# Patient Record
Sex: Female | Born: 1975
Health system: Southern US, Community
[De-identification: ages and names within clinical notes are randomized; demographics above are authoritative.]

## PROBLEM LIST (undated history)

## (undated) DIAGNOSIS — C4491 Basal cell carcinoma of skin, unspecified: Secondary | ICD-10-CM

## (undated) DIAGNOSIS — M7750 Other enthesopathy of unspecified foot: Secondary | ICD-10-CM

## (undated) DIAGNOSIS — E785 Hyperlipidemia, unspecified: Secondary | ICD-10-CM

## (undated) DIAGNOSIS — M51369 Other intervertebral disc degeneration, lumbar region without mention of lumbar back pain or lower extremity pain: Secondary | ICD-10-CM

## (undated) DIAGNOSIS — E119 Type 2 diabetes mellitus without complications: Secondary | ICD-10-CM

## (undated) DIAGNOSIS — J45909 Unspecified asthma, uncomplicated: Secondary | ICD-10-CM

## (undated) DIAGNOSIS — F419 Anxiety disorder, unspecified: Secondary | ICD-10-CM

## (undated) DIAGNOSIS — T7840XA Allergy, unspecified, initial encounter: Secondary | ICD-10-CM

## (undated) DIAGNOSIS — G43909 Migraine, unspecified, not intractable, without status migrainosus: Secondary | ICD-10-CM

## (undated) DIAGNOSIS — I341 Nonrheumatic mitral (valve) prolapse: Secondary | ICD-10-CM

## (undated) DIAGNOSIS — M797 Fibromyalgia: Secondary | ICD-10-CM

## (undated) DIAGNOSIS — G4730 Sleep apnea, unspecified: Secondary | ICD-10-CM

## (undated) DIAGNOSIS — H16139 Photokeratitis, unspecified eye: Secondary | ICD-10-CM

## (undated) DIAGNOSIS — I890 Lymphedema, not elsewhere classified: Secondary | ICD-10-CM

## (undated) DIAGNOSIS — I872 Venous insufficiency (chronic) (peripheral): Secondary | ICD-10-CM

## (undated) DIAGNOSIS — C439 Malignant melanoma of skin, unspecified: Secondary | ICD-10-CM

## (undated) DIAGNOSIS — M5136 Other intervertebral disc degeneration, lumbar region: Secondary | ICD-10-CM

## (undated) HISTORY — PX: FOOT TENDON SURGERY: SHX958

## (undated) HISTORY — DX: Unspecified asthma, uncomplicated: J45.909

## (undated) HISTORY — DX: Malignant melanoma of skin, unspecified: C43.9

## (undated) HISTORY — DX: Anxiety disorder, unspecified: F41.9

## (undated) HISTORY — DX: Hyperlipidemia, unspecified: E78.5

## (undated) HISTORY — DX: Other enthesopathy of unspecified foot and ankle: M77.50

## (undated) HISTORY — DX: Allergy, unspecified, initial encounter: T78.40XA

## (undated) HISTORY — DX: Basal cell carcinoma of skin, unspecified: C44.91

## (undated) HISTORY — PX: ABDOMINAL HYSTERECTOMY: SHX81

## (undated) HISTORY — PX: CARPAL TUNNEL RELEASE: SHX101

## (undated) HISTORY — PX: KNEE SURGERY: SHX244

## (undated) HISTORY — DX: Lymphedema, not elsewhere classified: I89.0

## (undated) HISTORY — PX: SPINAL FUSION: SHX223

## (undated) HISTORY — DX: Venous insufficiency (chronic) (peripheral): I87.2

## (undated) HISTORY — DX: Nonrheumatic mitral (valve) prolapse: I34.1

## (undated) HISTORY — DX: Migraine, unspecified, not intractable, without status migrainosus: G43.909

## (undated) HISTORY — DX: Photokeratitis, unspecified eye: H16.139

---

## 1998-05-18 ENCOUNTER — Other Ambulatory Visit: Admission: RE | Admit: 1998-05-18 | Discharge: 1998-05-18 | Payer: Self-pay | Admitting: Obstetrics and Gynecology

## 1998-11-25 ENCOUNTER — Encounter: Payer: Self-pay | Admitting: Emergency Medicine

## 1998-11-25 ENCOUNTER — Emergency Department (HOSPITAL_COMMUNITY): Admission: EM | Admit: 1998-11-25 | Discharge: 1998-11-25 | Payer: Self-pay | Admitting: Emergency Medicine

## 1999-07-11 ENCOUNTER — Emergency Department (HOSPITAL_COMMUNITY): Admission: EM | Admit: 1999-07-11 | Discharge: 1999-07-11 | Payer: Self-pay | Admitting: Emergency Medicine

## 2000-03-12 ENCOUNTER — Emergency Department (HOSPITAL_COMMUNITY): Admission: EM | Admit: 2000-03-12 | Discharge: 2000-03-12 | Payer: Self-pay | Admitting: Internal Medicine

## 2000-03-12 ENCOUNTER — Encounter: Payer: Self-pay | Admitting: Emergency Medicine

## 2000-07-01 ENCOUNTER — Emergency Department (HOSPITAL_COMMUNITY): Admission: EM | Admit: 2000-07-01 | Discharge: 2000-07-01 | Payer: Self-pay | Admitting: Emergency Medicine

## 2000-07-05 ENCOUNTER — Encounter: Payer: Self-pay | Admitting: Internal Medicine

## 2000-07-05 ENCOUNTER — Ambulatory Visit: Admission: RE | Admit: 2000-07-05 | Discharge: 2000-07-05 | Payer: Self-pay | Admitting: *Deleted

## 2000-09-07 ENCOUNTER — Emergency Department (HOSPITAL_COMMUNITY): Admission: EM | Admit: 2000-09-07 | Discharge: 2000-09-07 | Payer: Self-pay | Admitting: Internal Medicine

## 2000-10-18 ENCOUNTER — Encounter: Payer: Self-pay | Admitting: Emergency Medicine

## 2000-10-18 ENCOUNTER — Emergency Department (HOSPITAL_COMMUNITY): Admission: EM | Admit: 2000-10-18 | Discharge: 2000-10-18 | Payer: Self-pay | Admitting: Emergency Medicine

## 2000-12-30 ENCOUNTER — Emergency Department (HOSPITAL_COMMUNITY): Admission: EM | Admit: 2000-12-30 | Discharge: 2000-12-30 | Payer: Self-pay | Admitting: *Deleted

## 2001-01-07 ENCOUNTER — Encounter: Admission: RE | Admit: 2001-01-07 | Discharge: 2001-04-07 | Payer: Self-pay | Admitting: Orthopedic Surgery

## 2001-07-13 ENCOUNTER — Emergency Department (HOSPITAL_COMMUNITY): Admission: EM | Admit: 2001-07-13 | Discharge: 2001-07-13 | Payer: Self-pay | Admitting: Emergency Medicine

## 2001-07-13 ENCOUNTER — Encounter: Payer: Self-pay | Admitting: Emergency Medicine

## 2001-08-27 ENCOUNTER — Encounter (INDEPENDENT_AMBULATORY_CARE_PROVIDER_SITE_OTHER): Payer: Self-pay

## 2001-08-27 ENCOUNTER — Ambulatory Visit (HOSPITAL_COMMUNITY): Admission: RE | Admit: 2001-08-27 | Discharge: 2001-08-27 | Payer: Self-pay | Admitting: Gynecology

## 2001-12-09 ENCOUNTER — Emergency Department (HOSPITAL_COMMUNITY): Admission: EM | Admit: 2001-12-09 | Discharge: 2001-12-09 | Payer: Self-pay | Admitting: Emergency Medicine

## 2002-01-13 ENCOUNTER — Encounter: Admission: RE | Admit: 2002-01-13 | Discharge: 2002-01-13 | Payer: Self-pay | Admitting: Occupational Medicine

## 2002-01-13 ENCOUNTER — Encounter: Payer: Self-pay | Admitting: Occupational Medicine

## 2002-04-01 ENCOUNTER — Emergency Department (HOSPITAL_COMMUNITY): Admission: EM | Admit: 2002-04-01 | Discharge: 2002-04-01 | Payer: Self-pay | Admitting: Emergency Medicine

## 2003-12-12 ENCOUNTER — Emergency Department (HOSPITAL_COMMUNITY): Admission: EM | Admit: 2003-12-12 | Discharge: 2003-12-12 | Payer: Self-pay | Admitting: Emergency Medicine

## 2003-12-28 ENCOUNTER — Emergency Department (HOSPITAL_COMMUNITY): Admission: EM | Admit: 2003-12-28 | Discharge: 2003-12-28 | Payer: Self-pay | Admitting: Emergency Medicine

## 2003-12-31 ENCOUNTER — Emergency Department (HOSPITAL_COMMUNITY): Admission: EM | Admit: 2003-12-31 | Discharge: 2003-12-31 | Payer: Self-pay | Admitting: Emergency Medicine

## 2004-11-07 ENCOUNTER — Emergency Department: Payer: Self-pay | Admitting: Emergency Medicine

## 2004-12-27 ENCOUNTER — Ambulatory Visit: Payer: Self-pay | Admitting: Family Medicine

## 2004-12-30 ENCOUNTER — Ambulatory Visit: Payer: Self-pay | Admitting: Family Medicine

## 2005-01-12 ENCOUNTER — Ambulatory Visit: Payer: Self-pay | Admitting: Family Medicine

## 2005-02-07 ENCOUNTER — Ambulatory Visit: Payer: Self-pay | Admitting: Family Medicine

## 2005-02-14 ENCOUNTER — Ambulatory Visit: Payer: Self-pay | Admitting: Family Medicine

## 2005-03-14 ENCOUNTER — Ambulatory Visit: Payer: Self-pay | Admitting: Family Medicine

## 2005-05-09 ENCOUNTER — Ambulatory Visit: Payer: Self-pay | Admitting: Internal Medicine

## 2005-05-10 ENCOUNTER — Ambulatory Visit: Payer: Self-pay | Admitting: Internal Medicine

## 2005-05-13 ENCOUNTER — Emergency Department: Payer: Self-pay | Admitting: Emergency Medicine

## 2005-05-15 ENCOUNTER — Ambulatory Visit: Payer: Self-pay | Admitting: Family Medicine

## 2005-05-22 ENCOUNTER — Ambulatory Visit: Payer: Self-pay | Admitting: Cardiology

## 2005-06-02 ENCOUNTER — Ambulatory Visit: Payer: Self-pay | Admitting: Family Medicine

## 2005-06-02 LAB — CONVERTED CEMR LAB: Blood Glucose, Fasting: 92 mg/dL

## 2005-06-05 ENCOUNTER — Ambulatory Visit (HOSPITAL_COMMUNITY): Admission: RE | Admit: 2005-06-05 | Discharge: 2005-06-05 | Payer: Self-pay | Admitting: Family Medicine

## 2005-06-15 ENCOUNTER — Ambulatory Visit: Payer: Self-pay | Admitting: Internal Medicine

## 2005-06-21 ENCOUNTER — Ambulatory Visit: Payer: Self-pay | Admitting: Internal Medicine

## 2005-06-29 ENCOUNTER — Ambulatory Visit: Payer: Self-pay | Admitting: Internal Medicine

## 2005-08-14 ENCOUNTER — Emergency Department (HOSPITAL_COMMUNITY): Admission: EM | Admit: 2005-08-14 | Discharge: 2005-08-14 | Payer: Self-pay | Admitting: Emergency Medicine

## 2006-04-18 ENCOUNTER — Emergency Department: Payer: Self-pay | Admitting: Emergency Medicine

## 2006-09-21 ENCOUNTER — Ambulatory Visit: Payer: Self-pay | Admitting: Family Medicine

## 2006-11-14 ENCOUNTER — Ambulatory Visit: Payer: Self-pay | Admitting: Internal Medicine

## 2006-11-14 LAB — CONVERTED CEMR LAB
Alkaline Phosphatase: 92 units/L (ref 39–117)
Total Bilirubin: 0.8 mg/dL (ref 0.3–1.2)
Total Protein: 7.4 g/dL (ref 6.0–8.3)

## 2006-11-21 ENCOUNTER — Encounter: Admission: RE | Admit: 2006-11-21 | Discharge: 2006-11-21 | Payer: Self-pay | Admitting: Internal Medicine

## 2007-03-05 ENCOUNTER — Ambulatory Visit: Payer: Self-pay | Admitting: Internal Medicine

## 2007-03-05 LAB — CONVERTED CEMR LAB
Albumin: 3.7 g/dL (ref 3.5–5.2)
Total Bilirubin: 0.8 mg/dL (ref 0.3–1.2)

## 2007-03-18 ENCOUNTER — Ambulatory Visit: Payer: Self-pay | Admitting: Unknown Physician Specialty

## 2007-03-21 ENCOUNTER — Ambulatory Visit: Payer: Self-pay | Admitting: Unknown Physician Specialty

## 2007-05-18 ENCOUNTER — Other Ambulatory Visit: Payer: Self-pay

## 2007-05-18 ENCOUNTER — Emergency Department: Payer: Self-pay | Admitting: Emergency Medicine

## 2007-08-02 ENCOUNTER — Ambulatory Visit: Payer: Self-pay | Admitting: Family Medicine

## 2007-08-02 DIAGNOSIS — R945 Abnormal results of liver function studies: Secondary | ICD-10-CM

## 2007-08-02 DIAGNOSIS — N951 Menopausal and female climacteric states: Secondary | ICD-10-CM | POA: Insufficient documentation

## 2007-08-02 DIAGNOSIS — F410 Panic disorder [episodic paroxysmal anxiety] without agoraphobia: Secondary | ICD-10-CM

## 2007-08-02 DIAGNOSIS — IMO0001 Reserved for inherently not codable concepts without codable children: Secondary | ICD-10-CM

## 2007-08-02 DIAGNOSIS — E162 Hypoglycemia, unspecified: Secondary | ICD-10-CM

## 2007-08-02 DIAGNOSIS — J45909 Unspecified asthma, uncomplicated: Secondary | ICD-10-CM | POA: Insufficient documentation

## 2007-08-02 DIAGNOSIS — Z87891 Personal history of nicotine dependence: Secondary | ICD-10-CM

## 2007-08-02 DIAGNOSIS — F411 Generalized anxiety disorder: Secondary | ICD-10-CM

## 2007-08-05 LAB — CONVERTED CEMR LAB: TSH: 1.993 microintl units/mL (ref 0.350–5.50)

## 2007-08-23 ENCOUNTER — Ambulatory Visit: Payer: Self-pay | Admitting: Internal Medicine

## 2007-08-23 LAB — CONVERTED CEMR LAB
Albumin: 3.6 g/dL (ref 3.5–5.2)
Bilirubin, Direct: 0.1 mg/dL (ref 0.0–0.3)
Total Bilirubin: 0.7 mg/dL (ref 0.3–1.2)
Total Protein: 6.9 g/dL (ref 6.0–8.3)

## 2007-10-14 ENCOUNTER — Other Ambulatory Visit: Payer: Self-pay

## 2007-10-14 ENCOUNTER — Emergency Department: Payer: Self-pay | Admitting: Internal Medicine

## 2008-04-03 ENCOUNTER — Ambulatory Visit (HOSPITAL_COMMUNITY): Admission: RE | Admit: 2008-04-03 | Discharge: 2008-04-03 | Payer: Self-pay | Admitting: Neurosurgery

## 2008-06-28 IMAGING — US US EXTREM LOW VENOUS BILAT
1 series · 17 of 24 positions shown · non-contrast
Comparison: none

REASON FOR EXAM: leg pain
COMMENTS:

PROCEDURE:     US  - US DOPPLER LOW EXTR BILATERAL  - May 18, 2007  [DATE]
RESULT:     The right and left femoral and popliteal veins are normally
compressible. The waveform patterns are normal and the color-flow images are
normal.

[Series 1: us extrem low venous bilat · 17 of 58 slices shown]
[im 1/58]
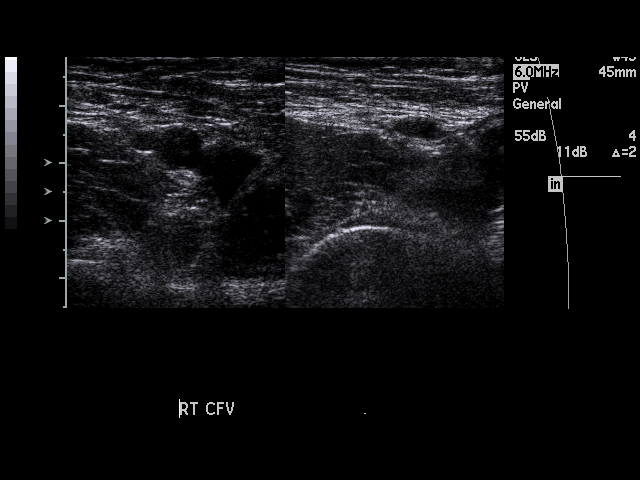
[im 5/58]
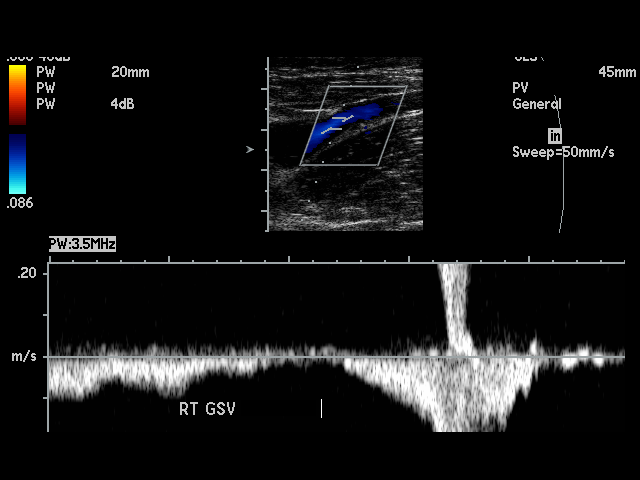
[im 8/58]
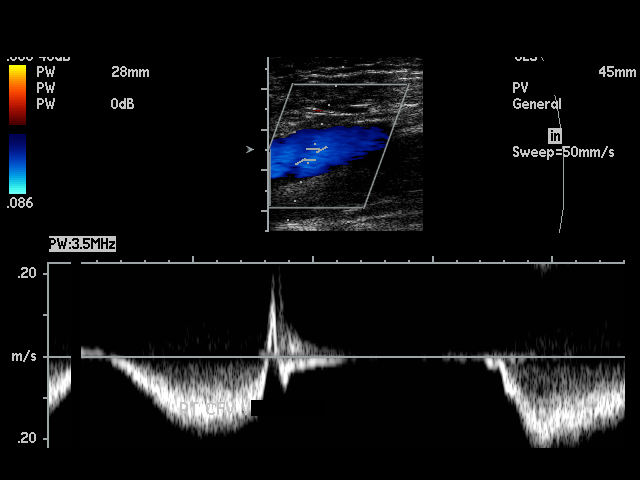
[im 10/58]
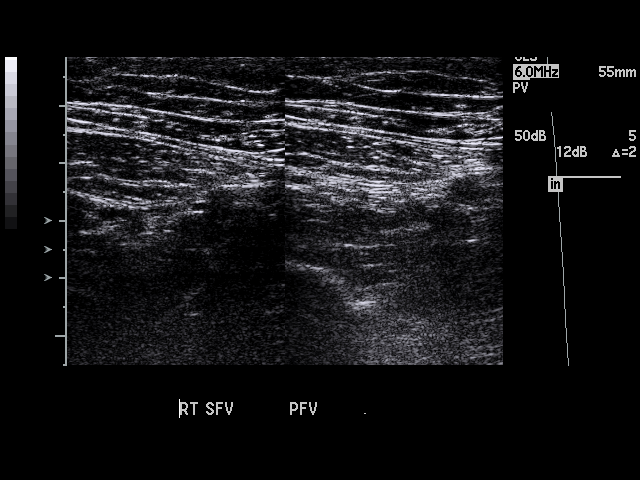
[im 15/58]
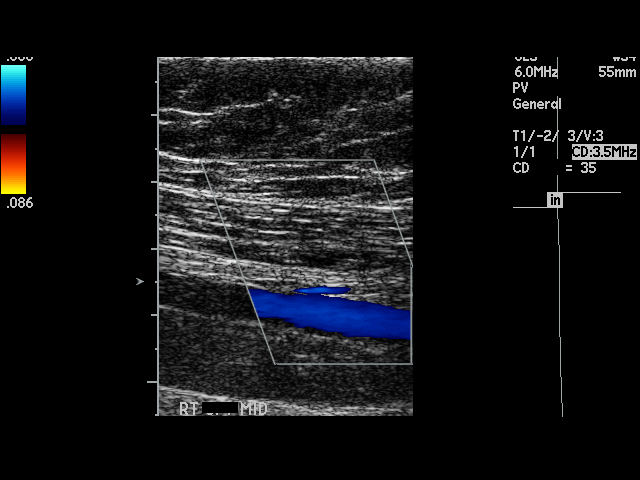
[im 18/58]
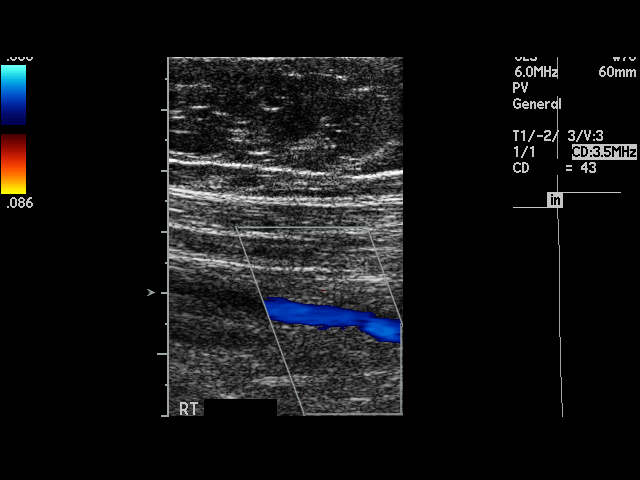
[im 23/58]
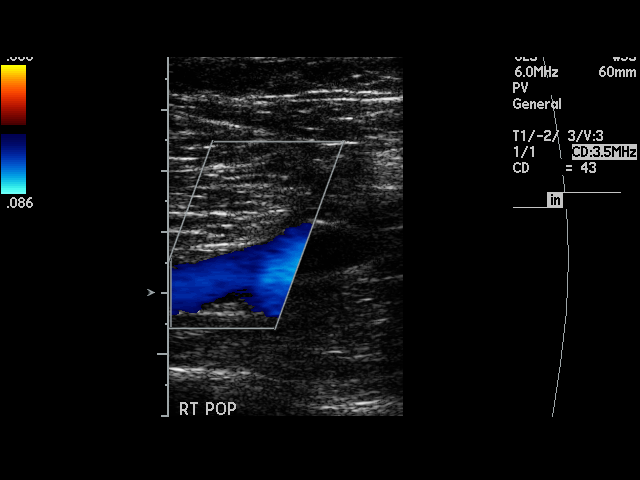
[im 25/58]
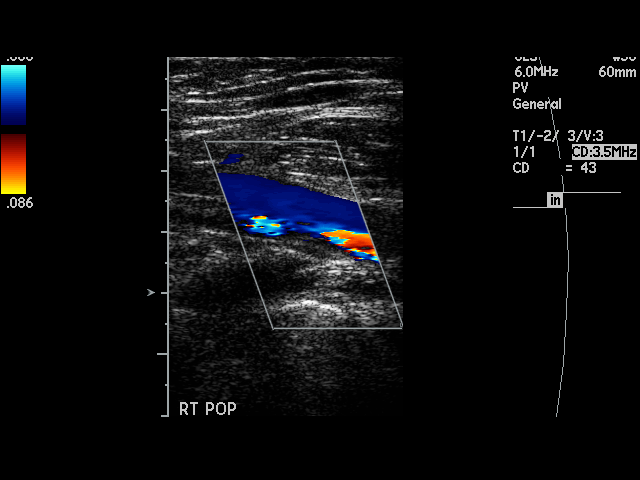
[im 30/58]
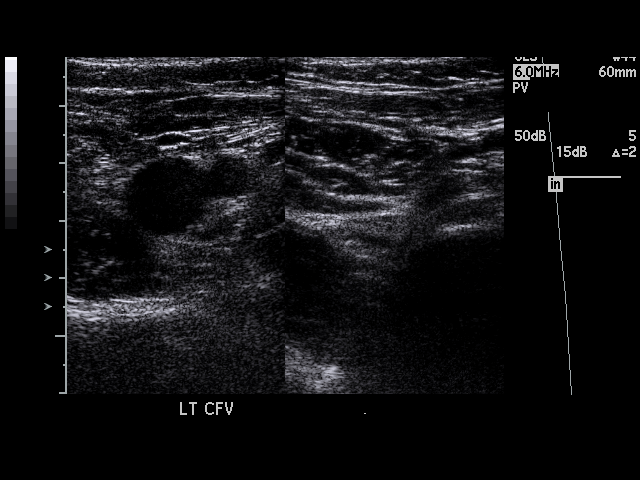
[im 33/58]
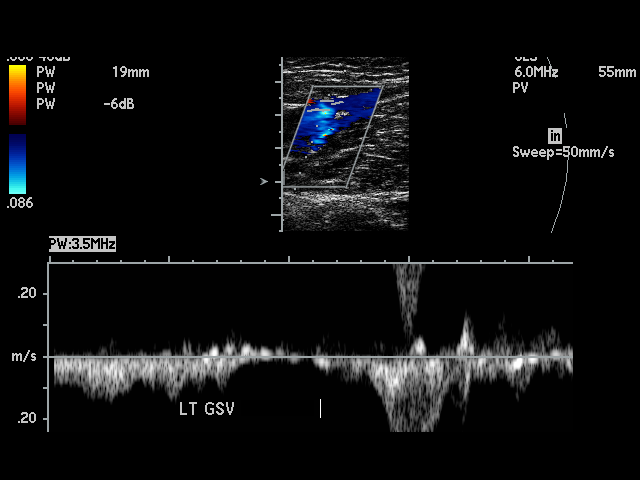
[im 35/58]
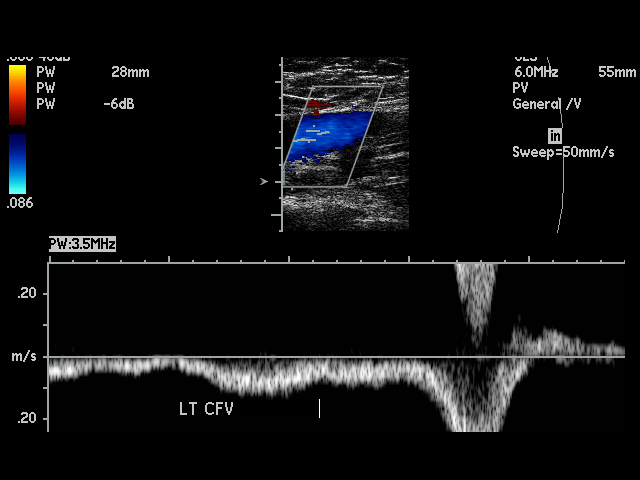
[im 40/58]
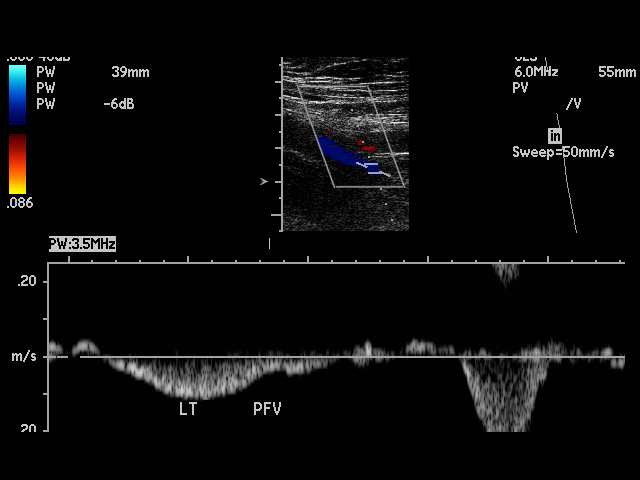
[im 43/58]
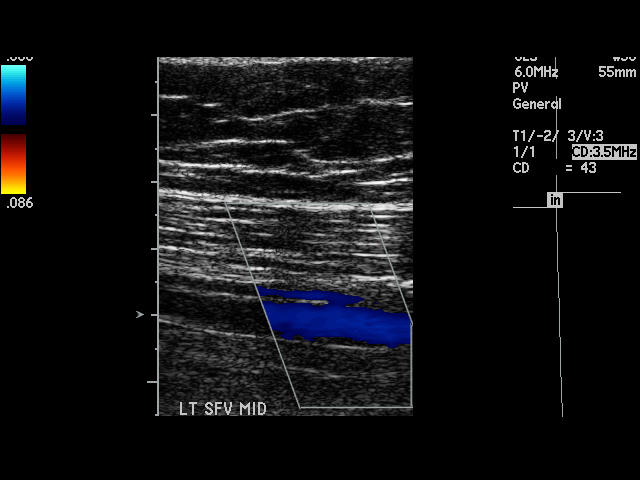
[im 48/58]
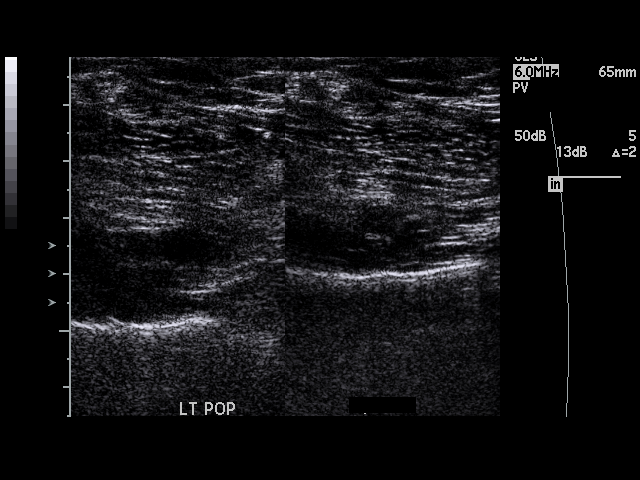
[im 50/58]
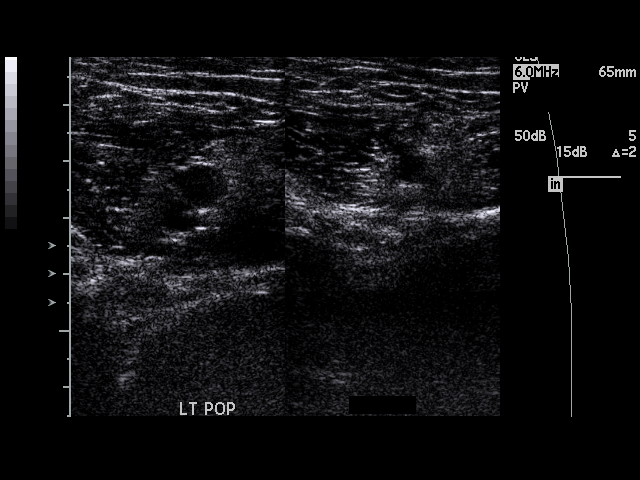
[im 53/58]
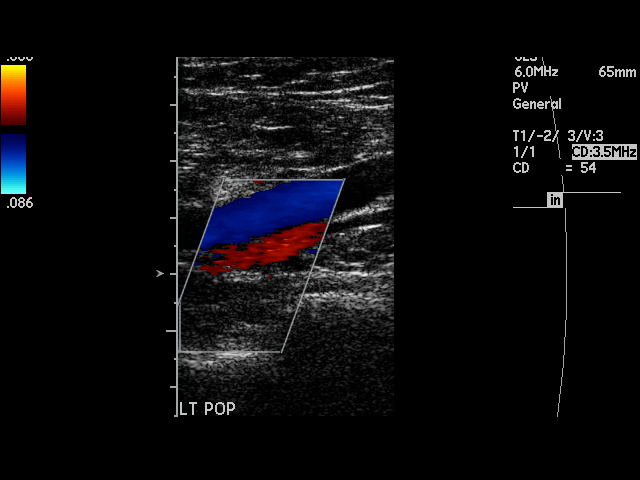
[im 58/58]
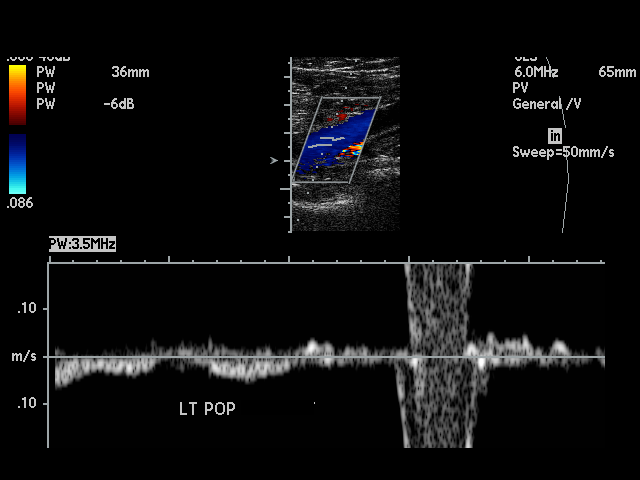

[17 of 24 positions shown; findings below may reference images not displayed]

IMPRESSION: I do not see thrombus within the right or left femoral or
popliteal veins.

The findings were called to the [HOSPITAL] the conclusion of
the study.

## 2008-07-24 ENCOUNTER — Emergency Department (HOSPITAL_COMMUNITY): Admission: EM | Admit: 2008-07-24 | Discharge: 2008-07-24 | Payer: Self-pay | Admitting: Emergency Medicine

## 2008-10-05 ENCOUNTER — Encounter: Admission: RE | Admit: 2008-10-05 | Discharge: 2008-10-05 | Payer: Self-pay | Admitting: Specialist

## 2008-11-26 ENCOUNTER — Inpatient Hospital Stay (HOSPITAL_COMMUNITY): Admission: RE | Admit: 2008-11-26 | Discharge: 2008-11-29 | Payer: Self-pay | Admitting: Specialist

## 2009-07-09 ENCOUNTER — Encounter: Admission: RE | Admit: 2009-07-09 | Discharge: 2009-07-09 | Payer: Self-pay | Admitting: Specialist

## 2009-11-18 ENCOUNTER — Encounter: Admission: RE | Admit: 2009-11-18 | Discharge: 2009-12-09 | Payer: Self-pay | Admitting: Anesthesiology

## 2009-12-09 ENCOUNTER — Encounter: Admission: RE | Admit: 2009-12-09 | Discharge: 2010-01-11 | Payer: Self-pay | Admitting: Anesthesiology

## 2010-01-07 IMAGING — RF DG LUMBAR SPINE 2-3V
1 series · 2 of 2 positions shown · non-contrast
Comparison: CT lumbar spine 10/05/2008.

CLINICAL DATA: L3-S1 PLIF.

LUMBAR SPINE - 2-3 VIEW

[Series 1: run · 2 of 2 slices shown]
[im 1/2]
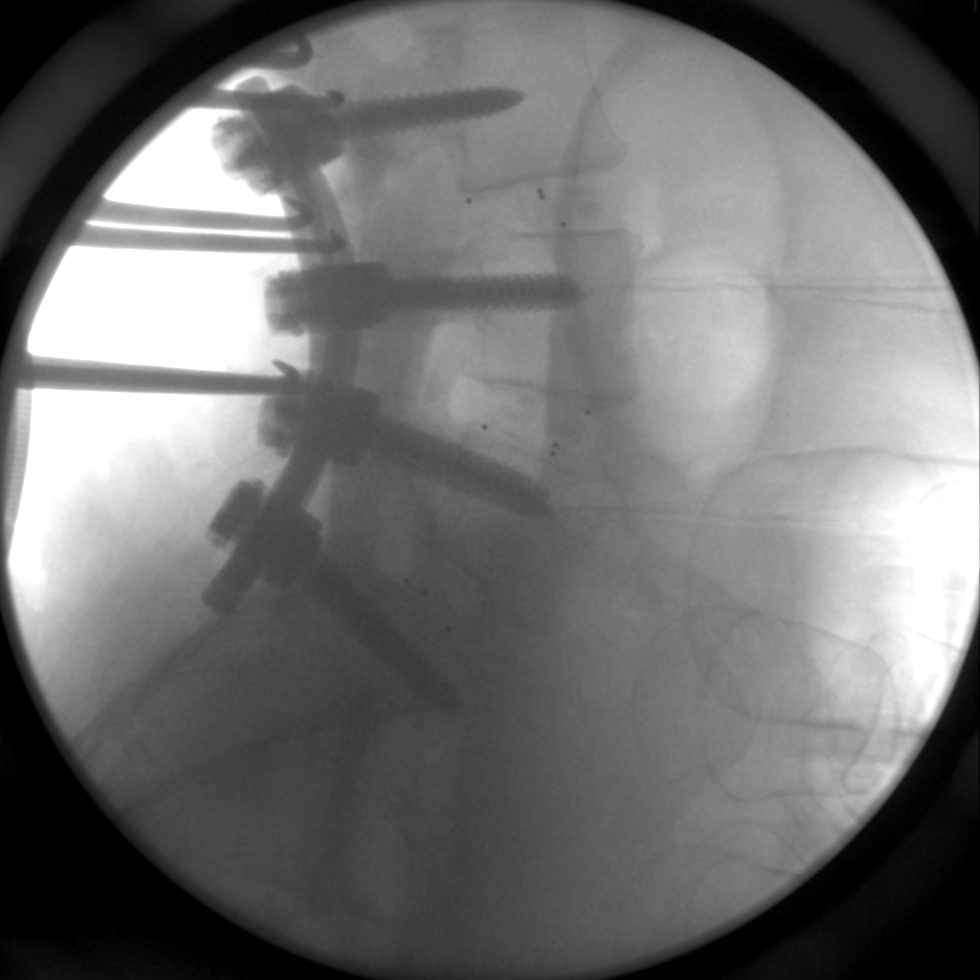
[im 2/2]
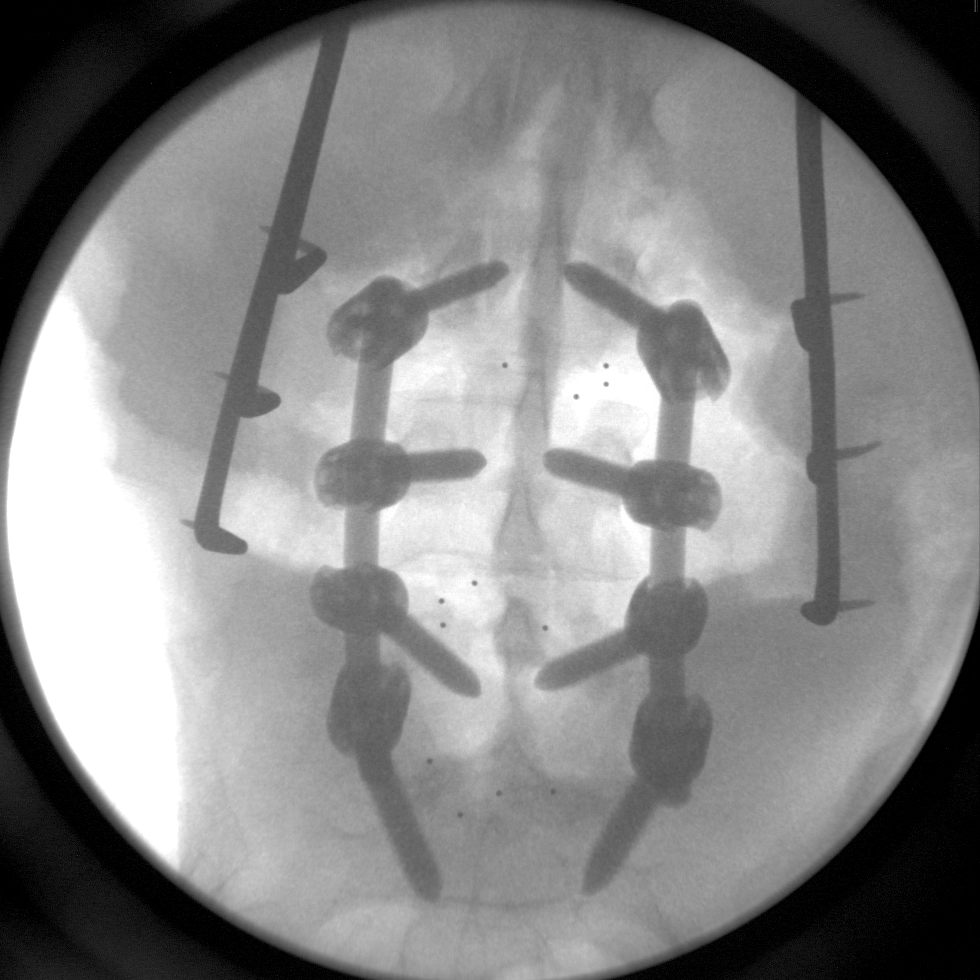

[2 of 2 positions shown; findings below may reference images not displayed]

FINDINGS: We are provided with two fluoroscopic spot views of the
lumbar spine demonstrating placement pedicle and screws
stabilization bars with interbody spacers from L3-S1.  Vertebral
body height and alignment are maintained.
IMPRESSION: L3- S1 PLIF.

## 2010-07-24 ENCOUNTER — Emergency Department (HOSPITAL_COMMUNITY): Admission: EM | Admit: 2010-07-24 | Discharge: 2010-07-24 | Payer: Self-pay | Admitting: Emergency Medicine

## 2011-01-04 ENCOUNTER — Encounter
Admission: RE | Admit: 2011-01-04 | Discharge: 2011-01-04 | Payer: Self-pay | Source: Home / Self Care | Attending: Family Medicine | Admitting: Family Medicine

## 2011-01-19 ENCOUNTER — Emergency Department (HOSPITAL_COMMUNITY): Payer: Medicare Other

## 2011-01-19 ENCOUNTER — Emergency Department (HOSPITAL_COMMUNITY)
Admission: EM | Admit: 2011-01-19 | Discharge: 2011-01-19 | Disposition: A | Discharge status: Home / Self Care | Payer: Medicare Other | Attending: Emergency Medicine | Admitting: Emergency Medicine

## 2011-01-19 DIAGNOSIS — R5383 Other fatigue: Secondary | ICD-10-CM | POA: Insufficient documentation

## 2011-01-19 DIAGNOSIS — R112 Nausea with vomiting, unspecified: Secondary | ICD-10-CM | POA: Insufficient documentation

## 2011-01-19 DIAGNOSIS — E119 Type 2 diabetes mellitus without complications: Secondary | ICD-10-CM | POA: Insufficient documentation

## 2011-01-19 DIAGNOSIS — E785 Hyperlipidemia, unspecified: Secondary | ICD-10-CM | POA: Insufficient documentation

## 2011-01-19 DIAGNOSIS — F3289 Other specified depressive episodes: Secondary | ICD-10-CM | POA: Insufficient documentation

## 2011-01-19 DIAGNOSIS — R630 Anorexia: Secondary | ICD-10-CM | POA: Insufficient documentation

## 2011-01-19 DIAGNOSIS — K5289 Other specified noninfective gastroenteritis and colitis: Secondary | ICD-10-CM | POA: Insufficient documentation

## 2011-01-19 DIAGNOSIS — R5381 Other malaise: Secondary | ICD-10-CM | POA: Insufficient documentation

## 2011-01-19 DIAGNOSIS — F329 Major depressive disorder, single episode, unspecified: Secondary | ICD-10-CM | POA: Insufficient documentation

## 2011-01-19 DIAGNOSIS — Z79899 Other long term (current) drug therapy: Secondary | ICD-10-CM | POA: Insufficient documentation

## 2011-01-19 DIAGNOSIS — R197 Diarrhea, unspecified: Secondary | ICD-10-CM | POA: Insufficient documentation

## 2011-01-19 DIAGNOSIS — R1084 Generalized abdominal pain: Secondary | ICD-10-CM | POA: Insufficient documentation

## 2011-01-19 LAB — URINALYSIS, ROUTINE W REFLEX MICROSCOPIC
Leukocytes, UA: NEGATIVE
Nitrite: NEGATIVE
Protein, ur: NEGATIVE mg/dL
Specific Gravity, Urine: 1.005 (ref 1.005–1.030)
Urobilinogen, UA: 0.2 mg/dL (ref 0.0–1.0)

## 2011-01-19 LAB — DIFFERENTIAL
Basophils Absolute: 0 10*3/uL (ref 0.0–0.1)
Basophils Relative: 0 % (ref 0–1)
Eosinophils Relative: 6 % — ABNORMAL HIGH (ref 0–5)
Lymphocytes Relative: 39 % (ref 12–46)
Monocytes Absolute: 0.3 10*3/uL (ref 0.1–1.0)
Neutro Abs: 3.5 10*3/uL (ref 1.7–7.7)

## 2011-01-19 LAB — URINE MICROSCOPIC-ADD ON

## 2011-01-19 LAB — COMPREHENSIVE METABOLIC PANEL
AST: 17 U/L (ref 0–37)
BUN: 7 mg/dL (ref 6–23)
CO2: 25 mEq/L (ref 19–32)
Calcium: 8.8 mg/dL (ref 8.4–10.5)
Chloride: 106 mEq/L (ref 96–112)
Creatinine, Ser: 0.73 mg/dL (ref 0.4–1.2)
GFR calc Af Amer: >60 mL/min (ref >60)
GFR calc non Af Amer: >60 mL/min (ref >60)
Glucose, Bld: 88 mg/dL (ref 70–99)
Total Bilirubin: 0.5 mg/dL (ref 0.3–1.2)

## 2011-01-19 LAB — CBC
HCT: 35.1 % — ABNORMAL LOW (ref 36.0–46.0)
MCHC: 34.2 g/dL (ref 30.0–36.0)
RDW: 12.4 % (ref 11.5–15.5)
WBC: 7 10*3/uL (ref 4.0–10.5)

## 2011-01-19 LAB — LIPASE, BLOOD: Lipase: 24 U/L (ref 11–59)

## 2011-01-19 LAB — GLUCOSE, CAPILLARY: Glucose-Capillary: 87 mg/dL (ref 70–99)

## 2011-01-19 MED ORDER — IOHEXOL 300 MG/ML  SOLN: 80.0000 mL | Freq: Once | INTRAMUSCULAR | Status: DC | PRN

## 2011-04-25 NOTE — Op Note (Signed)
NAME:  [name] [name], [name] [name]            ACCOUNT NO.:  [id number]   MEDICAL RECORD NO.:  [id number]          PATIENT TYPE:  INP   LOCATION:  5010                         FACILITY:  [facility]   PHYSICIAN:  Kerrin Champagne, M.D.   DATE OF BIRTH:  1976/10/25   DATE OF PROCEDURE:  11/26/2008  DATE OF DISCHARGE:                               OPERATIVE REPORT   PREOPERATIVE DIAGNOSIS:  Degenerative disk disease L3-4, L4-5, and L5-  S1.   POSTOPERATIVE DIAGNOSIS:  Degenerative disk disease L3-4, L4-5, and L5-  S1.   PROCEDURE:  Right-sided L3-4 transforaminal lumbar interbody fusion  using a 9-mm parallel DePuy leopard cage with local bone graft.  Left L4-  5 transforaminal lumbar interbody fusion using a 10-mm DePuy lordotic  leopard cage with local bone graft.  Left L5-S1 transforaminal lumbar  interbody fusion using a 9-mm DePuy lordotic cage with local bone graft.  Posterolateral fusion L3 to S1 with a combination of local bone graft  and Vitoss bone graft extender.  Bone marrow aspiration bilateral  transpedicular vertebral body of L4 with 20 mL of Vitoss material.  Pedicle screw and rod internal fixation from L3 to S1 using DePuy  Monarch pedicle screws system.   SURGEON:  Kerrin Champagne, MD   ASSISTANT:  Wende Neighbors, PA-C.   ANESTHESIA:  General via orotracheal intubation, Dr. Laverle Hobby  supplemented with local infiltration, Marcaine 0.5% with 1:200,000  epinephrine 10 mL.   FINDINGS:  As above.   DRAINS:  Foley to straight drain left Hemovac lumbar x1.   ESTIMATED BLOOD LOSS:  400 mL.   COMPLICATIONS:  None.   INTRAOPERATIVE NEURO MONITORING:  No significant EMG changes.  The  patient's soft tissue resistance testing of pedicle screws indicated  values of 30 bilateral at L3; left L4 28, right greater than 28; L5, 21  on the left, greater than 30 on the right; S1, 21 on the left, and  greater than 30 on the right.  Each of the differences in the soft  tissue resistance  were attributed to pedicle penetration on the  superficial inferior aspect of the pedicle L5 on the left at S1 and the  anterior aspect of the vertebral body of S1.   Cell Saver was used during this case 100 mL of hyper hematocrit blood  returned.  The patient returned to the PACU in good condition.   HISTORY OF PRESENT ILLNESS:  The patient is a 35 year old female who has  had history of low back pain, radiation to both lower extremities.  She  was diagnosed with diabetes, it is now controlled with oral agents and  diet.  She has undergone evaluation by numerous physicians and felt not  to be operable candidate.  She is on narcotic medications, unfortunately  requiring significant doses of hydrocodone to relieve her pain.  She  underwent evaluation.  Her evaluation demonstrated significant  spondylosis changes at the lowest lumbar segment, degenerative disk  changes seen at L3-4, L4-5, and L5-S1.  She underwent diskograms which  showed significant tears in the annular portions of the disk at L3-4, L4-  5,  and L5-S1 entering the neuroforamen on the right L3-4, left side at  L4-5 and L5-S1.  Additionally, preoperative myelograms demonstrated some  foraminal entrapment at L5-S1 on the right side along with lateral  recess stenosis here requiring decompression of the right L5-S1 lateral  recess.  This was performed during this patient's procedure.   DESCRIPTION OF PROCEDURE:  After adequate general anesthesia, the  patient had Foley catheter placed.  She was turned to the prone  position.  The Metolius spine frame was used with four poster.  She  underwent standard prep, DuraPrep solution from the mid dorsal and mid  sacral levels draped in the usual manner.  Bilateral PAS stockings and  TED hoses to prevent DVT.  Standard preoperative antibiotics advanced.  Following prep with DuraPrep solution, she was draped in usual manner,  iodine dye drape was used.  The patient's incision extending  from  approximately at the S1-S2 level superiorly to the L2 level  approximately 4-1/2 inches length through the skin and subcutaneous  layers carried down to the spinous process, midline carried over both  sides of the spinous process, preserving the spinous process and  interspinous ligament at each segment extending from S1-S2 to L5, L4,  L3, and then L2.  The posterior aspect of the spine was exposed  bilaterally using Cobb elevators and dividing muscle off the bone using  electrocautery at the L2 level, L3, L4, L5, and then S1 and this was  carried out over the sacral ala bilaterally and care was taken to  preserve the capsule at the L2-3 levels.  Capsules at L3-4, L4-5, L5-S1  were all resected bilaterally and exposure carried out to the tips of  the transverse process of L3, L4, L5, and the sacral ala bilaterally.  Bleeders were controlled using bipolar and monopolar electrocautery.  Viper retractor was inserted after intraoperative radiograph was  obtained with clamps on the spinous process of L2 and the spinous  process of S1.  A single Allis clamp was placed on the spinous process  of L4.  Each of these areas were marked for continued identification  throughout the case using an OR marker, a surgical marking pen.  Viper  retractor was inserted, bleeders were controlled using packing in each  side.  On the left side, the L4-5 inferior articular processes of L4 was  resected in total up to the insertion the ligamentum flavum on this side  this was resected, on the left side of the superior aspect lamina of L5  of the medial aspect of the facets at the L4-5 level.  The superior  articular process of L5 was then resected on the left side and the  neuroforamen for the L4 nerve root was then completely decompressed.  Resecting the reflected portion of the ligamentum flavum of this side.  Bleeders were controlled using bipolar electrocautery.  Thecal sac was  then retracted medially  and epidural veins cauterized.  The disk space  identified.  A 15 blade scalpel was used to incise the disk.  A small  portion of the disk material was resected.  Bleeding then controlled  using thrombin-soaked Gelfoam and small cottonoids.  Attention then  turned to the L5-S1 level on the left side where similarly large  hypertrophic inferior articular process of L5 was resected and the  superior articular process of S1 was resected on the left side, the  ligamentum flavum resected from its superior attachment of the ventral  aspect of the left L5  lamina.  The ligamentum flavum was resected down  to the superior portion of the S1 lamina and then resected off of this.  Foraminotomy performed over the S1 nerve roots and lateral recess  decompressed on the left side.  Osteotomy then performed of the S1  superior articular process at the level of the superior aspect of the  pedicle, and then decompression of the L5 neuroforamen carried out with  resection of the joint on the left side completely.  Bleeders were  controlled using the bipolar electrocautery as well as thrombin-soaked  Gelfoam.  The thecal sac was then retracted with the S1 nerve root on  the left side, disk identified using Penfield 4, small epidural veins  were then cauterized with bipolar electrocautery, divided with a 15  blade scalpel, exposing the disk in the posterolateral region.  A 15  blade scalpel used to incise the disk on the left side at the L5-S1  level.  Pituitary rongeur used to remove small amount of disk material.  Further hemostasis was then obtained by packing this area with thrombin-  soaked Gelfoam and cottonoids.  Attention returned to the right side of  the L3-4 level where similarly the inferior articular process of L3 on  the right side was resected using osteotomy or osteotomes provided.  A 3-  mm Kerrison used to resect inferior portion of the lamina of L3 on the  right side up to the insertion of  the ligamentum flavum and then the  disk was resected on the right side down to the superior aspect of the  lamina of L4.  This was then resected off the medial aspect of the facet  at the L3-4 level.  Hockey stick nerve probe was used to identify the  superior aspect of the pedicle of L4 and then osteotomy performed of the  superior articular process of L4 on the right side opening the right L3-  4 neuroforamen and decompressing the right L3 nerve root.  Bleeders were  controlled using bipolar electrocautery.  Thrombin-soaked Gelfoam  applied.   Finally, the right-sided lateral recess decompression was carried out at  the L5-S1 level performing a partial near complete inferior articular  process resection of L5.  Resecting the ligamentum flavum on the right  side to the superior aspect of the lamina of S1 performing a  foraminotomy over the right S1 nerve root and then decompressing the  right lateral recess resecting the superior articular process of S1  medially over about 30%.  This was completely resected spurs off the  medial aspect of the facet had been impressing upon the lateral recess  on this side.  A neuroforamen for the L5 nerve root as well as that for  S1 and the lateral recess were well decompressed without any signs of  further compression.  Thrombin-soaked Gelfoam was placed within the  lateral recess on the right side.  Attention turned to performing  pedicle screw fixation.  C-arm fluoro brought into the field on the left  side and awl used to make initial entry point for pedicle screw on the  left side at the L3 level.  This was at the intersection of the  transverse process of the L3 with the lateral aspect of the pedicle of  L3 and the superior articular process of L3.  All observed to be in  correct in position alignment, correct degree of anteversion and hand-  held straight pedicle finder was then used to probe the pedicle to  a  depth of 40 mm, and 40 mm x 5.5  screw was chosen for fixation of the  left pedicle.  Tapping performed using a 4.75 tap.  A ball-tip probe  used to probe the pedicle hole channel to ensure its patency and no sign  of broaching of cortex.  Decortication was then carried out on the  transverse process of L3 and local bone graft was then applied over this  transverse process.  Following testing of the opening within the pedicle  using a ball-tip probe for patency, no sign of broaching, a 40 mm x 5.5  screw was then placed on the left side of the L3 level.  The next  segment on the L4 on the left side, awl was used again to make an entry  point, observed on C-arm fluoro to be in good position, alignment,  correct degree of convergence at the intersection of the transverse  process of L4, the lateral aspect the pedicle at L4, and the superior  articular process.  A straight pedicle finder was then used probe the  pedicle to the depth of 40 mm.  A 6.25 screw was to be used for this.  The Vitoss was necessary in order to extend the bone graft present.  A  bone marrow aspiration equipment for the Vitoss was inserted into the  pedicle through the hole placed using the pedicle probe.  This was  placed to a depth of 35 mm and then bone marrow was aspirated from the  left L4 vertebral body through a transpedicular mechanism.  A 10 mL was  obtained.  This was used to charge the Vitoss 10 mL strip.  The strip  was cut into matchstick-like appearance, placed over the transverse  process of L3, decortication of the transverse process of L4 was carried  out and this carried down to this level, tapping of the left L4 pedicle  was then performed using 5.5 tap and a 6.25 screw was placed on the left  side at the L4 level.  Using a correct degree of convergence observing  on the C-arm fluoro, correct direction through the pedicle into the  vertebral body.  Note that testing of the pedicle hole prior to the  insertion screw was carried out  using ball-tip probe to ensure its  patency and ensure no sign of broaching of cortex.  At the left L5  level, similarly the awl was again used for making entry point over the  lateral aspect of pedicle at L5 in line with the pedicle and at its  intersection with the lateral aspect of the pedicle with its transverse  process.  This was also at the intersection with the patient's superior  articular process of L5.  Straight pedicle probe then used to probe the  left side using correct degree of convergence in line with the pedicle,  observed on C-arm fluoro, pedicle screw length 40 mm and a 6.25 screw  was chosen for the L5 level on the left side.  Tapping was performed  using 5.5 tap, decortication of the transverse process was carried out.  A ball-tip probe was used to observe the patency of the pedicle open,  and no broaching of cortex.  Following the decortication of the  transverse process, additional Vitoss material was then extended from L4  transverse process to L5.  A 40 mm x 6.25 screw was then placed on the  left side of the L5 level with correct degree of  convergence and  observed on the C-arm fluoro to be in good position and alignment.  The  next level S1 on the left side was approached by resecting the superior  articular process facet at the sacral level observing on the C-arm  fluoro entry point using an awl at the very inferior aspect of the  superior articular process of S1 where the dimple was present.  Then  using a straight pedicle probe, probed the pedicle to the depth of  nearly 45 mm, observed of the C-arm fluoro to be slightly penetrating  the anterior cortex of the S1 vertebral body, and this was an area where  bifurcation had already occurred, and there was no vessels in this area.  The patient's vital signs were stable throughout the case.  A smaller  size screw was chosen, 40 mm in length.  Tapping performed using a 6.25  tap and a 7.0 mm x 40 mm screw was  placed on the left side and correct  degree of convergence obtained, fixation of the sacrum on the left at  S1.  Decortication was carried out over the sacral ala and probing of  the pedicle channel was carried out identifying that the anterior cortex  had been broached at this level.  With her resection of bone and  articular process on the left side, small amount of the superficial portion of the pedicle at L4 on the left side was partially resected and  with pedicle screw placement, it was apparent that 1 or 2 threads of the  superficial portion of the screw was visible, and this likely will  change the soft tissue resistance on testing of the screws.  Similarly,  it was felt that on the S1 on the left side this would occur due to  broach of the cortex anteriorly.  Additional Vitoss was placed from L5  to S1 sacral ala, then bridging this area posterolateral.  Attention was  then turned to the right side where similarly, awl used to make the  entry point on the lateral aspect of the pedicle at L3 at the  intersection of the transverse process of L3 in superior articular  process in L3.  The pedicle probe was then used to probe the pedicle on  the right side to a depth of 40 mm, correct degree of  convergence.  A  ball-tip probe was used to observe channel to be patent.  No sign of  broaching of cortex with the insertion of the pedicle probe.  Tapping  was performed using a 4.75 tap and a 5.5 tap.  A 6.25 x 40 mm screw  placed on the right side using a correct degree of convergence observing  on C-arm fluoro to be in good position and alignment noted.  Decortication was carried out of the transverse process of the L3 on the  right side, local bone graft was applied to this level.  Then on the  right side at L4, an awl was used to make an entry point on the lateral  aspect of pedicle at L4 intersection of the transverse process on L4 and  pedicle probe was then carried out using a  straight pedicle probe to 40  mm, observed on C-arm fluoro to be in good position and alignment.  Additional bone marrow was then aspirated from the right side of the L4  using a bone marrow aspiration equipment.  This was used to charge an  additional 10 mL Vitoss strip, again cut  into strips.  This was then  applied from the decorticated transverse process of L3-L4 on the right  side, decorticating the transverse process of L4 prior to disk  placement.  Then, tapping with a 5.5 tap on the right side of the L4  level, 40 mm x 6.25 screw was then placed on the right side of the L4.  On the right side of the L5, then an awl again was used to make an entry  point, observed on the C-arm fluoro to be in good position and  alignment.  Pedicle probe was then used to probe the pedicle of L5 to a  depth of 40 mm, observed on C-ram fluoro to be in  good position and  alignment.  This was then tested with a ball-tip probe to ensure patency  and no sign of broaching cortex tap.  With the 5.5 tap and then a 6.25  screw x 40 mm used to perform pedicle screw fixation in the right L5  pedicle.  Decortication was carried out at the transverse process of L5  on the right side and testing of the hole for the pedicle performed  using a ball-tip probe ensuring the patency and no sign of broaching of  cortex.  The screw was placed without difficulty observed on C-arm  fluoro to be in good position and alignment.  Additional Vitoss was  applied from L4-L5 transverse process, following decortication of this  transverse process.  Attention then turned to the right S1 level where  again the facet for the S1 level was removed using osteotome and the ala  carefully exposed using electrocautery.  An awl was used to make an  initial entry point observed on C-arm fluoro to be in good position and  alignment on the lateral over the inferior aspect of the facet at L5-S1.  A straight pedicle probe was then used to probe  pedicle to depth of  nearly 40 mm and 7.0 x 40 mm screw was chosen, tapping with a 6.25 tap,  decorticating the ala and applying Vitoss from the ala to L5 transverse  process.  The 7.0 x 40 mm screw was then placed, correct degree of  convergence, obtaining excellent fixation and capture here.  With this  and each of the fasteners to the screw heads were then carefully  loosened using a loosener provided.  Intraoperative neuro-monitoring  testing was then carried out of each screw, noted to be between  measurements of 21 to greater than 30 at each level, 21 being the lowest  on the left side at L5 and L4 levels.  S1 on the left was 28.  It was  felt that all screws were well within the pedicles on the C-arm fluoro  views intraoperatively evaluating.  Each of the neuroforamen of the S1,  L5 nerve root felt to be freely patent at the S1 level, on the left side  at L4-L5 both the neuroforamen for the L4 nerve root and L5 foramen were  felt to be freely patent also tested at L4-L5 on the right side L3 and  L4 nerve roots felt to be well decompressed.  Following testing of the  pedicle screws then the rods were carefully contoured and inserted into  the fasteners on the right side first and caps were then applied at L3  to S1, total of 4 caps.  Then on the left side similarly, contour rod  was applied, caps were then applied from L3 to S1, total  of 4 caps.  Attention was turned to the TLIF performed on the right side at L3-L4  removing the disk material on the right side, retracting the thecal sac  medially L3 the nerve root and retracted superiorly.  The disk was  excised using a 15 blade scalpel, pituitary rongeurs.  The disk space  was then dilated using dilators 8, 9, 10 mm.  Disk space debride further  degenerative disk material and curettage was carried out of the  cartilaginous endplates at the inferior aspect of L3, superior aspect of  L4 down to bleeding subcondylar bone.  Straight  right, straight left  curettes were also used in order to resect further cartilaginous  endplate material.  The patient then had a straight ring curette used to  debride intervertebral disk space, any residual degenerative disk  material and cartilaginous materials of the upbiting ring curette was  similarly used.  Careful inspection of the disk space using loupe  magnification at length demonstrated that we have been able to remove  degenerative disk and cartilaginous endplate down bleeding endplate  bone.  Trial reduction using 8 and 9 mm and then 10 mm trial was  performed, 9 provided the best fit, so the 9 mm cage was chosen.  Bone  graft had been obtained locally from resected facet joints at right L3-  4, left L4-5, and left L5-S1 and right side S1 was carefully  morcellized, any soft tissue removed.  This was placed within the  intervertebral disk space and used to pack the leopard parallel cage 9  mm for the right L3-4 level.  After application of this bone graft  within the disk space, bone graft was then packed across the midline  using a 7-mm trial spacer.  A trial cage was then placed correct degree  orientation and then packed across the midline and carefully rotated  using a Kicker impactor to a transverse position.  Posterior aspect of  the cage on the right side could not be further impacted and it was left  just at the margin of the posterior aspect of the vertebral bodies of L3  and L4, felt not to be impinging within the neuroforamen on the right  side.  A hockey stick nerve probe could be easily passed out the neuroforamen.  Cage was beneath the posterior longitudinal ligament and  the posterior right corner of the disk space felt to be free.  Any  attempts of further impaction was felt would likely cause cage breakage,  therefore, the cage was left in this position and alignment.  Careful  inspection of the disk space, posterior aspect of the disk space, and  the  spinal canal demonstrated no bony fragment residual.  Irrigation was  carried out, small portions thrombin-soaked Gelfoam placed on the right  lateral recess.  The attention was then turned to the left side of L4-5  where similarly thecal sac was retracted, 15 blade scalpel used to  further incise the disk on the left near the foramen within the left  posterior lateral disposition.  Pituitary rongeurs used to debride the  intervertebral disk, degenerative disk material.  Disk space was then  elevated, dilated using a 8, 9, 10 mm dilators, eventually a 9-mm spacer  felt to be the best spacer.  Pituitary rongeurs used to debride the  intervertebral disk space.  Note that a 10-mm cage was used and dilation  was carried out to a 11 mm on the left side.  Disk space  was debride off  degenerative disk material, cartilaginous endplates were then resected  using curettage straight upbiting right, upbiting left curette used the  debride down to the bony bleeding, endplate bone.  Observed with loupes  and with head light deep bleeding bone surfaces.  Additional  decortication and removal of cartilage endplate carried out using ring  curettes.  Trial was then performed using a 8, 9 mm and 10 mm cage.  A  10-mm lordotic cage was chosen on the left side of L4-5 provided the  best fit.  This was packed with morcellized localized bone graft.  Additional bone graft was placed within the intervertebral disk space  and packed across the midline using an 8-mm trial.  Careful inspection  of the posterior aspect of the disk space and the spinal canal  demonstrated no further residual bone material here.  The cage was then  packed into the place a correct degree of convergence and then using a  Kicker impactor were rotated into a transverse position and impacted the  subset 2-3 mm beneath the posterior aspect of the disk space here.   Irrigation was carried out.  Bleeders controlled using bipolar   electrocautery and then thrombin-soaked Gelfoam placed.  Attention was  turned to the left L5-S1 level where similarly a thecal sac was then  retracted medially along with the S1 nerve root, 15 blade scalpel used  to incise the disk.  Pituitary rongeur was used to debride the disk  space, degenerative disk material dilation of disk was carried out to a  size 8 and 9 mm.  A 9-mm eventually was chosen.  The disk space was  debride further, disk material and curettage carried out at the  endplates debriding any cartilaginous endplate down to bleeding endplate  bone.  This was completed.  Trial was performed using 8 and 9-mm cage, a  9-mm lordotic charge was chosen.  The intervertebral disk space was  packed with morcellized bone graft.  Additional morcellized bone graft  was packed within the lordotic 9 mm left cage.  The cage was placed over  the intervertebral disk space following an impaction of the graft using  a 7-mm trial.  A 9-mm lordotic cage was then impacted into place and  carefully rotated into a transverse position using Kicker impactors,  subset beneath the posterior aspect of the disk space 2-3 mm.  Careful  inspection of the spinal canal demonstrated no bone fragments present.  No sign of impingement of nerve roots.  Irrigation was carried out.  Bleeders were controlled using bipolar electrocautery and then Gelfoam  placed.  Careful inspection each of the neuroforamen was again carried  out using a hockey-stick neuro probe demonstrated patency of the L4 and  L3 nerve roots on the right side, the L3-4 fasciectomy site, left L4 and  L5 nerve roots, left L4-5 fasciectomy site, and left L5 and S1 nerve  roots and fasciectomy site left L5-S1 level.  Attention was then turned  to completion of instrumentation.  Compression obtained between the  facet area of the rod on the left side at the L3 levels and on the right  side at the L3 level.  An 80 foot-pounds here.  Compression done  between  the fastener at L3-L4 on the right side at the level of the fasciectomy  and the fastener on the right side at L4 then tightened to 80 foot-  pounds.  On the left side, the compression between the L3 and L4  fasteners and L4 fastener was then tightened to 80 foot-pounds.  Attention then turned to the left side at the L5 level, compression  obtained between the L4 and L5 fasteners and the L5 fastener then  tightened to 80 foot-pounds.  On the right side, compression between the  L4 and L5 fasteners and the L5 fastener cap tightened to 80 foot-pounds.  Then to the left side compression between the L5 and S1 fasteners  performed and the S1 fastener cap tightened to 80 foot-pounds attaching  the rod on this side.  Compression done on the right side between the L5  and S1 fasteners and the S1 fastener tightened to 80 foot-pounds.  This  completed the fixation of the posterior aspect of the lumbar spine  intraoperative radiographs at AP and lateral views demonstrated internal  fixation of the pedicle from L3 through S1 in good position and  alignment.  The pedicle screws, rods all in good position and alignment  in addition to the cages at the L3, L4, and L5 levels.  Irrigation was  further carried out.  Debridement was carried out of any devitalized  tissue on the lateral aspect of the incision site.  Any residual  remaining posterior lateral bone grafting was performed using local bone  graft material over the Vitoss area.  All bone graft was used a small  amount of bone graft was placed within the decorticated facet on the  left side at L3-4, on the right side at L4-5, right side L5-S1.  These  each were decorticated with osteotomes and with high-speed bur.  Medium  Hemovac drains was placed in depth of the incision on the left side and  extending to the left lower lumbar side.  Lumbodorsal fascia reattached  to the interspinous ligaments and supraspinous ligaments with  interrupted  #1 Vicryl sutures in a simple figure-of-eight fashion.  The  deep subcu layers approximated with interrupted #1 Vicryl sutures and 0-  Vicryl sutures.  More superficial layers with interrupted 2-0 Vicryl  sutures and skin closed with a running subcu stitch of 4-0 Vicryl.  A  Dermabond applied, sealing the skin, 4 x 4's, ABD pads, fixed the skin  with hyper fix tape.  Hemovac discharged.  The patient was then  reactivated, extubated, and returned to the recovery room in  satisfaction condition.  Preoperatively, she had marking on the skin on  the left side at L4-5 and L5-S1 for documentation for TLIFs on this side  and on the right side at L3-4.  Preoperative incision site marking was  carried out as well as intraoperative time out describing the patient  the procedure to be performed and the participants.      Kerrin Champagne, M.D. Electronically Signed     JEN/MEDQ  D:  11/26/2008  T:  11/27/2008  Job:  [id number]

## 2011-04-28 NOTE — Discharge Summary (Signed)
NAME:  [name] [name], [name] [name] NO.:  [id number]   MEDICAL RECORD NO.:  [id number]          PATIENT TYPE:  INP   LOCATION:  5010                         FACILITY:  [facility]   PHYSICIAN:  Kerrin Champagne, M.D.   DATE OF BIRTH:  [birth year]   DATE OF ADMISSION:  11/26/2008  DATE OF DISCHARGE:  11/29/2008                               DISCHARGE SUMMARY   ADMISSION DIAGNOSES:  1. Degenerative disk disease at L3-4, L4-5 and L5-S1.  2. Diabetes mellitus.  3. Asthma.  4. Depression.  5. Migraine headaches.  6. History of pneumonia.  7. Elevated cholesterol.   DISCHARGE DIAGNOSES:  1. Degenerative disk disease at L3-4, L4-5 and L5-S1.  2. Diabetes mellitus.  3. Asthma.  4. Depression.  5. Migraine headaches.  6. History of pneumonia.  7. Elevated cholesterol.  8. Posthemorrhagic anemia.   PROCEDURE:  On November 26, 2008 the patient underwent right-sided L3-4  transforaminal lumbar interbody fusion, left L4-5 transforaminal lumbar  interbody fusion, and left L5-S1 transforaminal lumbar interbody fusion.  Posterolateral fusion at L3-S1 with combination of local bone graft and  Vitoss, pedicle screw and rod instrumentation.  This was performed by  Dr. Otelia Sergeant assisted by Maud Deed, PA-C under general anesthesia.   CONSULTATIONS:  None.   BRIEF HISTORY:  The patient is a 35 year old female with several years  history of low back pain with radiation into both lower extremities.  Unfortunately, she has been on narcotic medication requiring significant  doses of hydrocodone to relieve her pain.  She has undergone evaluation  demonstrating significant spondylosis changes at the lowest lumbar  segment with degenerative changes at L3-4, L4-5 and L5-S1.  Diskogram  showed significant tears in the annular portions of the disk at L3-4, L4-  5 and L5-S1 entering the neural foramen on the right at L3-4, left at L4-  5 and left L5-S1.  Myelograms have also demonstrated foraminal  entrapment at the L5-S1 level on the right along with lateral recess  stenosis which was felt to require a right L5-S1 lateral recess  decompression.  It was felt she would benefit from the surgical  procedure as stated above and the patient wished to proceed.   BRIEF HOSPITAL COURSE:  The patient tolerated the procedure under  general anesthesia without complications.  Postoperatively,  neurovascular motor function of lower extremities was noted to be  intact.  She initially did have some decreased sensation in the right L5  distribution.  This gradually did improve during the hospital stay.  The  patient's Hemovac drain was discontinued on the first postoperative day.  Daily dressing changes thereafter showed wound to be healing without  erythema or drainage.  The patient's hemoglobin and hematocrit dropped  to 8.2 and 22 on the first postoperative day and she did receive 2 units  of packed red blood cells.  Post transfusion values returned at 9.8 and  28.6 of hemoglobin and hematocrit.  The patient had one episode of  hyponatremia, but otherwise chemistry studies were within normal limits.  The patient was started on physical therapy for ambulation and gait  training.  She wore  a lumbar brace when out of bed.  She was able to don  and doff this without difficulty.  PCA analgesics were used for pain  control and she was gradually weaned to p.o. analgesics without  difficulty.  The patient did have elevated temperatures which were  treated with incentive spirometry.  Her Foley catheter was discontinued  and urinalysis was negative for urinary tract infection.  The patient  was able to void without difficulty.  Once bowel function returned, she  was taking a low-carbohydrate diet.  Her blood sugars remained stable.  On the third postoperative day, the patient was felt stable for  discharge to her home.  Arrangements made for durable medical equipment  and home health physical therapy  evaluation.   OTHER PERTINENT LABORATORY VALUES:  Coagulation studies on admission  within normal limits.  BUN dropped to 4 postoperatively.  Calcium noted  to be 7.6 postoperatively and 7.9 at discharge.  Hemoglobin A1c 4.7.  Urine culture with no growth on final report.   PLAN:  The patient was discharged to her home.  She will continue to  ambulate using her walker and her brace.  No lifting over 5 pounds, no  bending, stooping or twisting.  She will return to Dr. Barbaraann Faster office 2  week postop and was advised to call to arrange the appointment.  Dressing change to be done daily or as needed.  She will be allowed to  shower.  She will continue on a low-carbohydrate diet.  The patient will  continue on her home medications as taken prior to admission.  Home  medication reconciliation form was given to the patient with  instructions.  The patient was given prescriptions for analgesics and  muscle relaxers at discharge.  All questions encouraged and answered.   CONDITION ON DISCHARGE:  Stable.      Wende Neighbors, P.A.      Kerrin Champagne, M.D.  Electronically Signed    SMV/MEDQ  D:  01/07/2009  T:  01/07/2009  Job:  [id number]

## 2011-04-28 NOTE — Assessment & Plan Note (Signed)
[facility]                         GASTROENTEROLOGY OFFICE NOTE   NAME:[name], [name]                     MRN:          [id number]  DATE:11/14/2006                            DOB:          Jul 02, 1976    [name] [name] is a very nice 35 year old white female who comes for  evaluation of abnormal liver function tests.  She also has complaints  pertaining to postprandial diarrhea, dyspepsia, abdominal pain in right  upper quadrant.  We saw her for GI consultation in July 2006 because of  generalized abdominal pain, nausea, vomiting and weight loss.  In fact,  she has gained weight since then and has been quite over weight.  Her  upper GI evaluation included upper endoscopy, which was normal.  Her  symptoms were attributed to fibromyalgia and functional GI problems.  She was found to have abnormal liver function tests at that time, and  limited evaluation of that showed negative hepatitis ABC serologies and  gallbladder polyp on the ultrasound without evidence of cholecystitis.  She does have a family history of gallbladder disease in her maternal  grandmother.  She has continued to have dyspepsia and food intolerance  as well as postprandial diarrhea.  Her repeat liver function test on  July 05, 2005 were normal, so no further evaluation was carried out.  When she was recently seen by Dr. Corliss [name], she had again had elevation  of transaminases to about 1-1/2 times normal on more than one occasion.  Most recently on October 16, 2006 AST of 62, ALT of 87 with a blood  sugar of 180.  So, again her hepatitis serologies for ABC were negative.  Her TSH was normal, ANA titer was negative.   Patient denies family history of liver disease.  She does not drink  alcohol.  Never had blood transfusions.  She is not aware of  having  blood transfusions..  She never used drugs.   MEDICATIONS:  1. Amitriptyline 25 mg nightly.  2. Neurontin 300 mg t.i.d.  3.  Ultram 50 mg p.r.n.   She was on Protonix in the past, but has not continued on it.   PHYSICAL EXAMINATION:  Blood pressure 130/80.  Pulse 100.  Weight 174  pounds, which is increased since last visit, 6 pounds.  She was somewhat anxious.  Alert, oriented and cooperative.  Sclerae is anicteric.  NECK:  Supple.  No adenopathy.  LUNGS:  Clear to auscultation.  COR:  Rapid S1 and S2.  No murmur.  ABDOMEN:  Soft with normoactive bowel sounds.  Somewhat obese, but  diffusely tender in all quadrants, more so in the epigastrium, but also  in the right upper quadrant, right lower quadrant as well as left lower  quadrant.  There was no rebound and no palpable mass.  No bruit.  No CVA  tenderness.  RECTAL:  Exam showed soft Hemoccult negative stool.   IMPRESSION:  15. A 35 year old white female with abnormal liver function tests,      mostly transaminases.  History of gallbladder polyp and dyspepsia      suggestive of biliary dysfunction.  Her liver function  abnormalities may pertain to fatty liver, but need to rule out      Hemochromatosis or some other hereditary or genetic liver disease.      It is not likely due to medication since the abnormalities of liver      tests were noted prior to taking the current medication.  2. Irritable bowel syndrome by history with postprandial diarrhea and      dyspepsia.  Normal upper endoscopy, doubt inflammatory bowel      disease since the diarrhea is not consistent.  3. Fibromyalgia.   PLAN:  1. Repeat upper abdominal ultrasound to look for fatty liver and to      update [place] on the gallbladder polyps versus stone.  2. HIDA scan with CCK.  3. Today we will obtain alpha-1- antitrypsin, antimitochondrial      antibody titer, anti-smooth muscle antibody, serum ferritin levels.      She already had a negative ANA titer.  4. Pepcid 40 mg daily for dyspepsia.  5. Bentyl 10 mg before meals for irritable bowel syndrome.     [name] [name]. [name] Chance, MD   Electronically Signed    DMB/MedQ  DD: 11/14/2006  DT: 11/14/2006  Job #: [id number]   cc:   [name] A. Milinda Antis, MD  [name] Hitch, MD
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
## 2011-04-28 NOTE — Op Note (Signed)
[facility]  [name], [name] [name] Visit Number: [id number] MRN: [id number]          Service Type: DSU Location: 9300 [phone] Attending Physician:  Tonye Royalty Dictated by:   Gaetano Hawthorne. Lily Peer, M.D. Proc. Date: 08/27/01 Admit Date:  08/27/2001 Discharge Date: 08/27/2001                             Operative Report  SURGEON:                      Juan H. Lily Peer, M.D.  FIRST ASSISTANT:              Timothy P. Fontaine, M.D.  INDICATIONS FOR OPERATION:    A 35 year old gravida 3, para 2, AB1 with long-standing history of chronic pelvic and left lower quadrant pain and also requesting elective permanent sterilization.  PREOPERATIVE DIAGNOSES:       1. Chronic left lower quadrant pelvic pain.                               2. Request for elective permanent sterilization.  POSTOPERATIVE DIAGNOSES:      1. Chronic left lower quadrant pelvic pain.                               2. Request for elective permanent sterilization.  ANESTHESIA:                   General endotracheal.  PROCEDURE PERFORMED:          1. Diagnostic laparoscopy.                               2. Laparoscopic left salpingo-oophorectomy.                               3. Segmental resection of ampullary isthmic                                  portion of the fallopian tube and                                  cauterization.  FINDINGS:                     Patient had a normal pelvic cavity. Anterior/posterior cul-de-sac were free of adhesions or endometriotic implant. Both ovaries were normal in appearance, as were the fallopian tubes, normal appearing appendix and smooth liver surface on inspection.  DESCRIPTION OF OPERATION:     After the patient was adequately counseled she was taken to the operating room where she underwent a successful general endotracheal anesthesia.  She was placed in the low lithotomy position.  The abdomen, vagina, perineum were  prepped and draped in the usual sterile fashion.  Examination under anesthesia demonstrated a slightly anteverted uterus, normal size, shape, and consistency.  Adnexa without masses or tenderness.  A Hulka tenaculum was placed for manipulation during laparoscopic procedure as well as a Foley catheter in an effort to monitor  urinary output. After the drapes were in place a small stab incision was made underneath the umbilicus followed by insertion of the Veress needle.  Opening intra-abdominal pressure was 5 mmHg and approximately 3 L of carbon dioxide was insufflated into the abdominal cavity.  After this the 10 mm trocar was inserted into the peritoneal cavity.  The sleeve was left in place.  The trocar was removed and the laparoscope was inserted.  Patient was then placed in light Trendelenburg position and a 10-11 mm trocar was utilized after a small stab incision was made approximately four fingerbreadths from the midline in the left lower quadrant under laparoscopic guidance.  A contralateral paramedian side on the patients right lower quadrant a 5 mm trocar was inserted.  Under laparoscopic guidance the left fallopian tube and left ovary were placed under tension. The left ureter was identified and an endo GIA stapler was utilized to clamp, cut, and staple the infundibulopelvic ligament.  This required several rows of staples up to the point of the left uterotubal junction.  Once this was completed the left tube and ovary were removed and passed off for histological evaluation.  Attention was then placed to the contralateral fallopian tube which was placed under tension and a 3 cm segment was cauterized with bipolar Kleppinger forceps and a 2 cm segment was cut, excised, and passed off the operative field for histological evaluation.  Following this the pelvic cavity was copiously irrigated with normal saline solution.  Inspection of the pelvic cavity demonstrated good hemostasis.  The  carbon dioxide was removed from the peritoneal cavity and the trocars were removed.  The subumbilical 10 mm trocar site and the left lower quadrant 10-11 mm trocar site, the fascia were closed with figure-of-eight of 3-0 Vicryl suture.  All three incision sites, the skin were reapproximated with subcuticular stitch of 4-0 plain catgut suture.  For postoperative analgesia 0.25% Marcaine was infiltrated in all three incision sites for a total of 10 cc.  The Hulka tenaculum was removed as was the Foley catheter.  Urine output was 350 cc and IV fluid was 2200 cc of lactated Ringers.  Patient did receive 1 g of Cefotan prophylactically.  She was extubated and transferred to recovery room with stable vital signs. Dictated by:   Gaetano Hawthorne Lily Peer, M.D. Attending Physician:  Tonye Royalty DD:  08/27/01 TD:  08/27/01 Job: [id number]

## 2011-05-18 ENCOUNTER — Emergency Department (HOSPITAL_COMMUNITY)
Admission: EM | Admit: 2011-05-18 | Discharge: 2011-05-18 | Disposition: A | Discharge status: Home / Self Care | Payer: Medicare Other | Attending: Emergency Medicine | Admitting: Emergency Medicine

## 2011-05-18 DIAGNOSIS — T63461A Toxic effect of venom of wasps, accidental (unintentional), initial encounter: Secondary | ICD-10-CM | POA: Insufficient documentation

## 2011-05-18 DIAGNOSIS — F329 Major depressive disorder, single episode, unspecified: Secondary | ICD-10-CM | POA: Insufficient documentation

## 2011-05-18 DIAGNOSIS — E785 Hyperlipidemia, unspecified: Secondary | ICD-10-CM | POA: Insufficient documentation

## 2011-05-18 DIAGNOSIS — T6391XA Toxic effect of contact with unspecified venomous animal, accidental (unintentional), initial encounter: Secondary | ICD-10-CM | POA: Insufficient documentation

## 2011-05-18 DIAGNOSIS — Z79899 Other long term (current) drug therapy: Secondary | ICD-10-CM | POA: Insufficient documentation

## 2011-05-18 DIAGNOSIS — M79609 Pain in unspecified limb: Secondary | ICD-10-CM | POA: Insufficient documentation

## 2011-05-18 DIAGNOSIS — E119 Type 2 diabetes mellitus without complications: Secondary | ICD-10-CM | POA: Insufficient documentation

## 2011-05-18 DIAGNOSIS — F3289 Other specified depressive episodes: Secondary | ICD-10-CM | POA: Insufficient documentation

## 2011-09-15 LAB — BASIC METABOLIC PANEL
BUN: 4 mg/dL — ABNORMAL LOW (ref 6–23)
BUN: 5 mg/dL — ABNORMAL LOW (ref 6–23)
CO2: 26 mEq/L (ref 19–32)
Calcium: 7.9 mg/dL — ABNORMAL LOW (ref 8.4–10.5)
Chloride: 102 mEq/L (ref 96–112)
Creatinine, Ser: 1.06 mg/dL (ref 0.4–1.2)
GFR calc Af Amer: >60 mL/min (ref >60)
GFR calc non Af Amer: >60 mL/min (ref >60)
GFR calc non Af Amer: >60 mL/min (ref >60)
Glucose, Bld: 88 mg/dL (ref 70–99)
Potassium: 3.7 mEq/L (ref 3.5–5.1)
Potassium: 3.9 mEq/L (ref 3.5–5.1)
Sodium: 137 mEq/L (ref 135–145)
Sodium: 140 mEq/L (ref 135–145)
Sodium: 141 mEq/L (ref 135–145)

## 2011-09-15 LAB — URINALYSIS, ROUTINE W REFLEX MICROSCOPIC
Bilirubin Urine: NEGATIVE
Bilirubin Urine: NEGATIVE
Leukocytes, UA: NEGATIVE
Nitrite: NEGATIVE
Nitrite: NEGATIVE
Specific Gravity, Urine: 1.009 (ref 1.005–1.030)
Specific Gravity, Urine: 1.011 (ref 1.005–1.030)
Urobilinogen, UA: 0.2 mg/dL (ref 0.0–1.0)
pH: 7 (ref 5.0–8.0)

## 2011-09-15 LAB — PROTIME-INR: INR: 1 (ref 0.00–1.49)

## 2011-09-15 LAB — COMPREHENSIVE METABOLIC PANEL
Albumin: 4.1 g/dL (ref 3.5–5.2)
BUN: 8 mg/dL (ref 6–23)
Calcium: 9.4 mg/dL (ref 8.4–10.5)
Glucose, Bld: 78 mg/dL (ref 70–99)
Sodium: 135 mEq/L (ref 135–145)
Total Protein: 6.1 g/dL (ref 6.0–8.3)

## 2011-09-15 LAB — GLUCOSE, CAPILLARY
Glucose-Capillary: 104 mg/dL — ABNORMAL HIGH (ref 70–99)
Glucose-Capillary: 107 mg/dL — ABNORMAL HIGH (ref 70–99)
Glucose-Capillary: 137 mg/dL — ABNORMAL HIGH (ref 70–99)
Glucose-Capillary: 72 mg/dL (ref 70–99)
Glucose-Capillary: 92 mg/dL (ref 70–99)

## 2011-09-15 LAB — DIFFERENTIAL
Lymphs Abs: 2.8 10*3/uL (ref 0.7–4.0)
Monocytes Relative: 5 % (ref 3–12)
Neutro Abs: 4.6 10*3/uL (ref 1.7–7.7)
Neutrophils Relative %: 58 % (ref 43–77)

## 2011-09-15 LAB — APTT: aPTT: 31 seconds (ref 24–37)

## 2011-09-15 LAB — TYPE AND SCREEN: Antibody Screen: NEGATIVE

## 2011-09-15 LAB — HEMOGLOBIN AND HEMATOCRIT, BLOOD
HCT: 24 % — ABNORMAL LOW (ref 36.0–46.0)
Hemoglobin: 8.2 g/dL — ABNORMAL LOW (ref 12.0–15.0)
Hemoglobin: 8.2 g/dL — ABNORMAL LOW (ref 12.0–15.0)
Hemoglobin: 9.8 g/dL — ABNORMAL LOW (ref 12.0–15.0)

## 2011-09-15 LAB — HEMATOCRIT: HCT: 25.4 % — ABNORMAL LOW (ref 36.0–46.0)

## 2011-09-15 LAB — URINE MICROSCOPIC-ADD ON

## 2011-09-15 LAB — URINE CULTURE

## 2011-09-15 LAB — CBC
HCT: 36 % (ref 36.0–46.0)
Hemoglobin: 12.5 g/dL (ref 12.0–15.0)
MCHC: 34.7 g/dL (ref 30.0–36.0)
Platelets: 459 10*3/uL — ABNORMAL HIGH (ref 150–400)
RDW: 13 % (ref 11.5–15.5)

## 2012-06-20 DIAGNOSIS — E119 Type 2 diabetes mellitus without complications: Secondary | ICD-10-CM | POA: Insufficient documentation

## 2012-06-20 DIAGNOSIS — E1165 Type 2 diabetes mellitus with hyperglycemia: Secondary | ICD-10-CM | POA: Insufficient documentation

## 2012-06-20 DIAGNOSIS — F410 Panic disorder [episodic paroxysmal anxiety] without agoraphobia: Secondary | ICD-10-CM | POA: Insufficient documentation

## 2012-06-20 DIAGNOSIS — J449 Chronic obstructive pulmonary disease, unspecified: Secondary | ICD-10-CM | POA: Insufficient documentation

## 2012-06-20 DIAGNOSIS — I10 Essential (primary) hypertension: Secondary | ICD-10-CM | POA: Insufficient documentation

## 2012-08-05 DIAGNOSIS — M961 Postlaminectomy syndrome, not elsewhere classified: Secondary | ICD-10-CM | POA: Insufficient documentation

## 2012-08-05 DIAGNOSIS — F1120 Opioid dependence, uncomplicated: Secondary | ICD-10-CM | POA: Insufficient documentation

## 2012-08-05 DIAGNOSIS — Z5181 Encounter for therapeutic drug level monitoring: Secondary | ICD-10-CM | POA: Insufficient documentation

## 2013-04-28 DIAGNOSIS — R079 Chest pain, unspecified: Secondary | ICD-10-CM | POA: Insufficient documentation

## 2013-06-19 DIAGNOSIS — M79603 Pain in arm, unspecified: Secondary | ICD-10-CM | POA: Insufficient documentation

## 2014-04-07 DIAGNOSIS — G253 Myoclonus: Secondary | ICD-10-CM | POA: Insufficient documentation

## 2014-09-26 ENCOUNTER — Emergency Department (HOSPITAL_COMMUNITY): Payer: Medicare Other

## 2014-09-26 ENCOUNTER — Encounter (HOSPITAL_COMMUNITY): Payer: Self-pay | Admitting: Emergency Medicine

## 2014-09-26 ENCOUNTER — Emergency Department (HOSPITAL_COMMUNITY)
Admission: EM | Admit: 2014-09-26 | Discharge: 2014-09-26 | Disposition: A | Discharge status: Home / Self Care | Payer: Medicare Other | Attending: Emergency Medicine | Admitting: Emergency Medicine

## 2014-09-26 DIAGNOSIS — Z880 Allergy status to penicillin: Secondary | ICD-10-CM | POA: Insufficient documentation

## 2014-09-26 DIAGNOSIS — Y9389 Activity, other specified: Secondary | ICD-10-CM | POA: Insufficient documentation

## 2014-09-26 DIAGNOSIS — Z791 Long term (current) use of non-steroidal anti-inflammatories (NSAID): Secondary | ICD-10-CM | POA: Insufficient documentation

## 2014-09-26 DIAGNOSIS — E119 Type 2 diabetes mellitus without complications: Secondary | ICD-10-CM | POA: Insufficient documentation

## 2014-09-26 DIAGNOSIS — M797 Fibromyalgia: Secondary | ICD-10-CM | POA: Diagnosis not present

## 2014-09-26 DIAGNOSIS — Y9289 Other specified places as the place of occurrence of the external cause: Secondary | ICD-10-CM | POA: Insufficient documentation

## 2014-09-26 DIAGNOSIS — X58XXXA Exposure to other specified factors, initial encounter: Secondary | ICD-10-CM | POA: Insufficient documentation

## 2014-09-26 DIAGNOSIS — S99922A Unspecified injury of left foot, initial encounter: Secondary | ICD-10-CM | POA: Diagnosis present

## 2014-09-26 DIAGNOSIS — M79672 Pain in left foot: Secondary | ICD-10-CM

## 2014-09-26 DIAGNOSIS — Z79899 Other long term (current) drug therapy: Secondary | ICD-10-CM | POA: Diagnosis not present

## 2014-09-26 DIAGNOSIS — Z7982 Long term (current) use of aspirin: Secondary | ICD-10-CM | POA: Diagnosis not present

## 2014-09-26 HISTORY — DX: Other intervertebral disc degeneration, lumbar region without mention of lumbar back pain or lower extremity pain: M51.369

## 2014-09-26 HISTORY — DX: Other intervertebral disc degeneration, lumbar region: M51.36

## 2014-09-26 HISTORY — DX: Type 2 diabetes mellitus without complications: E11.9

## 2014-09-26 HISTORY — DX: Fibromyalgia: M79.7

## 2014-09-26 MED ORDER — NAPROXEN 500 MG PO TABS: 500.0000 mg | ORAL_TABLET | Freq: Two times a day (BID) | ORAL | Status: DC
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
## 2014-09-29 NOTE — ED Provider Notes (Signed)
Medical screening examination/treatment/procedure(s) were performed by non-physician practitioner and as supervising physician I was immediately available for consultation/collaboration.   EKG Interpretation None        [name], MD 09/29/14 1501

## 2014-10-01 DIAGNOSIS — R600 Localized edema: Secondary | ICD-10-CM | POA: Insufficient documentation

## 2014-10-12 DIAGNOSIS — M5416 Radiculopathy, lumbar region: Secondary | ICD-10-CM | POA: Insufficient documentation

## 2014-10-21 DIAGNOSIS — M25569 Pain in unspecified knee: Secondary | ICD-10-CM | POA: Insufficient documentation

## 2014-10-21 DIAGNOSIS — J4521 Mild intermittent asthma with (acute) exacerbation: Secondary | ICD-10-CM | POA: Insufficient documentation

## 2014-10-21 DIAGNOSIS — J4520 Mild intermittent asthma, uncomplicated: Secondary | ICD-10-CM | POA: Insufficient documentation

## 2014-11-09 DIAGNOSIS — I872 Venous insufficiency (chronic) (peripheral): Secondary | ICD-10-CM | POA: Insufficient documentation

## 2014-11-10 DIAGNOSIS — E1169 Type 2 diabetes mellitus with other specified complication: Secondary | ICD-10-CM | POA: Insufficient documentation

## 2014-11-10 DIAGNOSIS — I890 Lymphedema, not elsewhere classified: Secondary | ICD-10-CM | POA: Insufficient documentation

## 2014-11-10 DIAGNOSIS — E785 Hyperlipidemia, unspecified: Secondary | ICD-10-CM | POA: Insufficient documentation

## 2015-04-13 DIAGNOSIS — G9050 Complex regional pain syndrome I, unspecified: Secondary | ICD-10-CM | POA: Insufficient documentation

## 2015-04-25 DIAGNOSIS — M228X9 Other disorders of patella, unspecified knee: Secondary | ICD-10-CM | POA: Insufficient documentation

## 2015-07-15 DIAGNOSIS — M5442 Lumbago with sciatica, left side: Secondary | ICD-10-CM | POA: Insufficient documentation

## 2015-07-15 DIAGNOSIS — G8929 Other chronic pain: Secondary | ICD-10-CM | POA: Insufficient documentation

## 2016-04-06 DIAGNOSIS — M1712 Unilateral primary osteoarthritis, left knee: Secondary | ICD-10-CM | POA: Insufficient documentation

## 2017-10-09 DIAGNOSIS — D239 Other benign neoplasm of skin, unspecified: Secondary | ICD-10-CM | POA: Insufficient documentation

## 2019-07-16 DIAGNOSIS — M5441 Lumbago with sciatica, right side: Secondary | ICD-10-CM | POA: Diagnosis not present

## 2019-07-16 DIAGNOSIS — M9903 Segmental and somatic dysfunction of lumbar region: Secondary | ICD-10-CM | POA: Diagnosis not present

## 2019-07-16 DIAGNOSIS — M9904 Segmental and somatic dysfunction of sacral region: Secondary | ICD-10-CM | POA: Diagnosis not present

## 2019-07-16 DIAGNOSIS — M5442 Lumbago with sciatica, left side: Secondary | ICD-10-CM | POA: Diagnosis not present

## 2019-07-21 DIAGNOSIS — Z79891 Long term (current) use of opiate analgesic: Secondary | ICD-10-CM | POA: Diagnosis not present

## 2019-07-21 DIAGNOSIS — M5441 Lumbago with sciatica, right side: Secondary | ICD-10-CM | POA: Diagnosis not present

## 2019-07-21 DIAGNOSIS — M545 Low back pain: Secondary | ICD-10-CM | POA: Diagnosis not present

## 2019-07-21 DIAGNOSIS — M25559 Pain in unspecified hip: Secondary | ICD-10-CM | POA: Diagnosis not present

## 2019-07-21 DIAGNOSIS — M25569 Pain in unspecified knee: Secondary | ICD-10-CM | POA: Diagnosis not present

## 2019-07-21 DIAGNOSIS — G894 Chronic pain syndrome: Secondary | ICD-10-CM | POA: Diagnosis not present

## 2019-07-21 DIAGNOSIS — M9903 Segmental and somatic dysfunction of lumbar region: Secondary | ICD-10-CM | POA: Diagnosis not present

## 2019-07-21 DIAGNOSIS — M5442 Lumbago with sciatica, left side: Secondary | ICD-10-CM | POA: Diagnosis not present

## 2019-07-21 DIAGNOSIS — M9904 Segmental and somatic dysfunction of sacral region: Secondary | ICD-10-CM | POA: Diagnosis not present

## 2019-07-22 ENCOUNTER — Other Ambulatory Visit: Payer: Self-pay

## 2019-07-22 ENCOUNTER — Encounter: Payer: Self-pay | Admitting: Adult Health

## 2019-07-22 ENCOUNTER — Ambulatory Visit (INDEPENDENT_AMBULATORY_CARE_PROVIDER_SITE_OTHER): Payer: Medicare Other | Admitting: Adult Health

## 2019-07-22 VITALS — BP 118/76 | HR 91 | Resp 16 | Ht 61.0 in | Wt 161.0 lb

## 2019-07-22 DIAGNOSIS — E1165 Type 2 diabetes mellitus with hyperglycemia: Secondary | ICD-10-CM

## 2019-07-22 DIAGNOSIS — J4520 Mild intermittent asthma, uncomplicated: Secondary | ICD-10-CM | POA: Diagnosis not present

## 2019-07-22 DIAGNOSIS — K439 Ventral hernia without obstruction or gangrene: Secondary | ICD-10-CM | POA: Diagnosis not present

## 2019-07-22 DIAGNOSIS — N951 Menopausal and female climacteric states: Secondary | ICD-10-CM | POA: Diagnosis not present

## 2019-07-22 DIAGNOSIS — G5601 Carpal tunnel syndrome, right upper limb: Secondary | ICD-10-CM | POA: Insufficient documentation

## 2019-07-22 DIAGNOSIS — M797 Fibromyalgia: Secondary | ICD-10-CM

## 2019-07-22 DIAGNOSIS — F339 Major depressive disorder, recurrent, unspecified: Secondary | ICD-10-CM

## 2019-07-22 LAB — POCT GLYCOSYLATED HEMOGLOBIN (HGB A1C): Hemoglobin A1C: 6.1 % — AB (ref 4.0–5.6)

## 2019-07-22 MED ORDER — ESTROGENS, CONJUGATED 0.625 MG/GM VA CREA: 1.0000 | TOPICAL_CREAM | Freq: Every day | VAGINAL | 1 refills | Status: DC

## 2019-07-22 MED ORDER — FLUOXETINE HCL 20 MG PO CAPS: 20.0000 mg | ORAL_CAPSULE | Freq: Every day | ORAL | 2 refills | Status: DC

## 2019-07-22 NOTE — Progress Notes (Signed)
[facility] [place], [place] [address]  Internal MEDICINE  Office Visit Note  Patient Name: [name] [name]  [id number]  [id number]  Date of Service: 07/22/2019   Complaints/HPI Pt is here for establishment of PCP. Chief Complaint  Patient presents with  . Medical Management of Chronic Issues    new patient  . Diabetes  . Quality Metric Gaps    physical    HPI Pt is here to establish care.  She has a history of Migraines, DM, Fibromyalgia, asthma, copd. She denies any overt issues at this time.  Her A1C is 6.1 today.  She is in need of a physical to close her quality metric gaps.  This will be scheduled at this time.     Current Medication: Outpatient Encounter Medications as of 07/22/2019  Medication Sig Note  . ACCU-CHEK AVIVA PLUS test strip    . albuterol (VENTOLIN HFA) 108 (90 Base) MCG/ACT inhaler Inhale into the lungs.   . Blood Glucose Monitoring Suppl (ACCU-CHEK AVIVA) device Use as instructed to test blood sugar 1 time daily. DX: E11.9   . conjugated estrogens (PREMARIN) vaginal cream Place 1 Applicatorful vaginally daily. Twice a week   . cyclobenzaprine (FLEXERIL) 10 MG tablet Take 10 mg by mouth 3 (three) times daily as needed for muscle spasms.   . fluticasone (FLONASE) 50 MCG/ACT nasal spray Place 2 sprays into both nostrils daily.   . furosemide (LASIX) 20 MG tablet Take 20 mg by mouth. Take 1/2 tab as needed   . HYDROmorphone (DILAUDID) 4 MG tablet Take by mouth every 6 (six) hours as needed for severe pain.   . Ipratropium-Albuterol (COMBIVENT RESPIMAT) 20-100 MCG/ACT AERS respimat Inhale 1 puff into the lungs every 6 (six) hours.   Marland Kitchen liraglutide (VICTOZA) 18 MG/3ML SOPN Inject 1.2 mg into the skin.    . meloxicam (MOBIC) 15 MG tablet Take 15 mg by mouth daily.   . metFORMIN (GLUCOPHAGE) 500 MG tablet Take 1,000 mg by mouth 2 (two) times daily with a meal.   . Methylnaltrexone Bromide (RELISTOR) 150 MG TABS Take 3 tablets by mouth.    . morphine (MS CONTIN) 60 MG 12 hr tablet Take 60 mg by mouth every 12 (twelve) hours.   . pravastatin (PRAVACHOL) 10 MG tablet Take 10 mg by mouth daily.   . pregabalin (LYRICA) 150 MG capsule Take 150 mg by mouth 3 (three) times daily.    . promethazine (PHENERGAN) 25 MG tablet Take 25 mg by mouth every 6 (six) hours as needed for nausea or vomiting.   . SUMAtriptan (IMITREX) 100 MG tablet Take 100 mg by mouth every 2 (two) hours as needed for migraine. May repeat in 2 hours if headache persists or recurs.   . [DISCONTINUED] aspirin 81 MG tablet Take 81 mg by mouth daily.   . [DISCONTINUED] atenolol (TENORMIN) 50 MG tablet Take 50 mg by mouth daily. 09/26/2014: Pt stated she took medication on Wednesday morning.  . [DISCONTINUED] cetirizine (ZYRTEC) 10 MG tablet Take 10 mg by mouth daily as needed for allergies (allergies).   . [DISCONTINUED] diazepam (VALIUM) 5 MG tablet Take 5 mg by mouth 2 (two) times daily.   . [DISCONTINUED] HYDROmorphone (DILAUDID) 2 MG tablet Take 2 mg by mouth 4 (four) times daily.   . [DISCONTINUED] meloxicam (MOBIC) 7.5 MG tablet Take 15 mg by mouth daily as needed for pain (pain).    . [DISCONTINUED] metFORMIN (GLUCOPHAGE) 500 MG tablet Take 500 mg by mouth  2 (two) times daily with a meal.   . [DISCONTINUED] morphine (MS CONTIN) 30 MG 12 hr tablet Take 60 mg by mouth every 12 (twelve) hours.    . [DISCONTINUED] naproxen (NAPROSYN) 500 MG tablet Take 1 tablet (500 mg total) by mouth 2 (two) times daily. (Patient not taking: Reported on 07/22/2019)   . [DISCONTINUED] simvastatin (ZOCOR) 20 MG tablet Take 20 mg by mouth at bedtime.   . [DISCONTINUED] SUMAtriptan (IMITREX) 50 MG tablet Take 50 mg by mouth every 2 (two) hours as needed for migraine or headache (migraine). May repeat in 2 hours if headache persists or recurs.    No facility-administered encounter medications on file as of 07/22/2019.     Surgical History: Past Surgical History:  Procedure Laterality Date   . ABDOMINAL HYSTERECTOMY    . CARPAL TUNNEL RELEASE    . KNEE SURGERY    . SPINAL FUSION    . SPINAL FUSION      Medical History: Past Medical History:  Diagnosis Date  . Actinic keratitis   . Allergy   . Anxiety   . Asthma   . Basal cell carcinoma   . Chronic venous insufficiency   . DDD (degenerative disc disease), lumbar   . Diabetes mellitus without complication ([place])   . Fibromyalgia   . Fibromyalgia   . Hyperlipidemia   . Lymphedema   . Melanoma ([place])   . Migraine   . Mitral valve prolapse     Family History: Family History  Problem Relation Age of Onset  . Cancer Paternal Aunt   . Cancer Paternal Uncle   . Diabetes Paternal Grandfather   . Heart disease Paternal Grandfather     Social History   Socioeconomic History  . Marital status: Divorced    Spouse name: Not on file  . Number of children: Not on file  . Years of education: Not on file  . Highest education level: Not on file  Occupational History  . Not on file  Social Needs  . Financial resource strain: Not on file  . Food insecurity    Worry: Not on file    Inability: Not on file  . Transportation needs    Medical: Not on file    Non-medical: Not on file  Tobacco Use  . Smoking status: Never Smoker  . Smokeless tobacco: Never Used  Substance and Sexual Activity  . Alcohol use: No  . Drug use: No  . Sexual activity: Not on file  Lifestyle  . Physical activity    Days per week: Not on file    Minutes per session: Not on file  . Stress: Not on file  Relationships  . Social Herbalist on phone: Not on file    Gets together: Not on file    Attends religious service: Not on file    Active member of club or organization: Not on file    Attends meetings of clubs or organizations: Not on file    Relationship status: Not on file  . Intimate partner violence    Fear of current or ex partner: Not on file    Emotionally abused: Not on file    Physically abused: Not on file     Forced sexual activity: Not on file  Other Topics Concern  . Not on file  Social History Narrative  . Not on file     Review of Systems  Vital Signs: BP 118/76   Pulse 91  Resp 16   Ht 5\' 1"  (1.549 m)   Wt 161 lb (73 kg)   LMP 12/13/1991   SpO2 96%   BMI 30.42 kg/m    Physical Exam Vitals signs and nursing note reviewed.  Constitutional:      General: She is not in acute distress.    Appearance: She is well-developed. She is not diaphoretic.  HENT:     Head: Normocephalic and atraumatic.     Mouth/Throat:     Pharynx: No oropharyngeal exudate.  Eyes:     Pupils: Pupils are equal, round, and reactive to light.  Neck:     Musculoskeletal: Normal range of motion and neck supple.     Thyroid: No thyromegaly.     Vascular: No JVD.     Trachea: No tracheal deviation.  Cardiovascular:     Rate and Rhythm: Normal rate and regular rhythm.     Heart sounds: Normal heart sounds. No murmur. No friction rub. No gallop.   Pulmonary:     Effort: Pulmonary effort is normal. No respiratory distress.     Breath sounds: Normal breath sounds. No wheezing or rales.  Chest:     Chest wall: No tenderness.  Abdominal:     Palpations: Abdomen is soft.     Tenderness: There is no abdominal tenderness. There is no guarding.  Musculoskeletal: Normal range of motion.  Lymphadenopathy:     Cervical: No cervical adenopathy.  Skin:    General: Skin is warm and dry.  Neurological:     Mental Status: She is alert and oriented to person, place, and time.     Cranial Nerves: No cranial nerve deficit.  Psychiatric:        Behavior: Behavior normal.        Thought Content: Thought content normal.        Judgment: Judgment normal.    Assessment/Plan: 1. Uncontrolled type 2 diabetes mellitus with hyperglycemia (HCC) Stable, a1c is 6.1 today.   - POCT HgB A1C  2. Mild intermittent asthma, unspecified whether complicated - Pulmonary Function Test; Future  3. Vaginal dryness,  menopausal Refilled premarin.  - conjugated estrogens (PREMARIN) vaginal cream; Place 1 Applicatorful vaginally daily. Twice a week  Dispense: 90 g; Refill: 1  4. Ventral hernia without obstruction or gangrene [place], continue present management.   5. Fibromyalgia Stable, continue present thearpy.  6. Depression, recurrent (HCC) - FLUoxetine (PROZAC) 20 MG capsule; Take 1 capsule (20 mg total) by mouth daily.  Dispense: 30 capsule; Refill: 2  General Counseling: Alessandria verbalizes understanding of the findings of todays visit and agrees with plan of treatment. I have discussed any further diagnostic evaluation that may be needed or ordered today. We also reviewed her medications today. she has been encouraged to call the office with any questions or concerns that should arise related to todays visit.  Orders Placed This Encounter  Procedures  . POCT HgB A1C    No orders of the defined types were placed in this encounter.   Time spent: 30 Minutes   This patient was seen by Orson Gear AGNP-C in Collaboration with Dr Lavera Guise as a part of collaborative care agreement  Kendell Bane AGNP-C Internal Medicine

## 2019-07-28 DIAGNOSIS — M5441 Lumbago with sciatica, right side: Secondary | ICD-10-CM | POA: Diagnosis not present

## 2019-07-28 DIAGNOSIS — M9904 Segmental and somatic dysfunction of sacral region: Secondary | ICD-10-CM | POA: Diagnosis not present

## 2019-07-28 DIAGNOSIS — M5442 Lumbago with sciatica, left side: Secondary | ICD-10-CM | POA: Diagnosis not present

## 2019-07-28 DIAGNOSIS — M9903 Segmental and somatic dysfunction of lumbar region: Secondary | ICD-10-CM | POA: Diagnosis not present

## 2019-07-29 ENCOUNTER — Encounter: Payer: Self-pay | Admitting: Adult Health

## 2019-08-06 ENCOUNTER — Ambulatory Visit (INDEPENDENT_AMBULATORY_CARE_PROVIDER_SITE_OTHER): Payer: Medicare Other | Admitting: Adult Health

## 2019-08-06 ENCOUNTER — Ambulatory Visit: Payer: Medicare Other | Admitting: Internal Medicine

## 2019-08-06 ENCOUNTER — Other Ambulatory Visit: Payer: Self-pay

## 2019-08-06 ENCOUNTER — Encounter: Payer: Self-pay | Admitting: Adult Health

## 2019-08-06 VITALS — Temp 98.7°F | Ht 61.0 in | Wt 160.0 lb

## 2019-08-06 DIAGNOSIS — Z20828 Contact with and (suspected) exposure to other viral communicable diseases: Secondary | ICD-10-CM | POA: Diagnosis not present

## 2019-08-06 DIAGNOSIS — J0100 Acute maxillary sinusitis, unspecified: Secondary | ICD-10-CM | POA: Diagnosis not present

## 2019-08-06 DIAGNOSIS — Z20822 Contact with and (suspected) exposure to covid-19: Secondary | ICD-10-CM

## 2019-08-06 DIAGNOSIS — R6889 Other general symptoms and signs: Secondary | ICD-10-CM | POA: Diagnosis not present

## 2019-08-06 MED ORDER — SULFAMETHOXAZOLE-TRIMETHOPRIM 400-80 MG PO TABS: 1.0000 | ORAL_TABLET | Freq: Two times a day (BID) | ORAL | 0 refills | Status: DC

## 2019-08-06 NOTE — Progress Notes (Signed)
[facility] [place], [place] [address]  Internal MEDICINE  Telephone Visit  Patient Name: [name] [name]  [id number]  [id number]  Date of Service: 08/06/2019  I connected with the patient at 143 by telephone and verified the patients identity using two identifiers.   I discussed the limitations, risks, security and privacy concerns of performing an evaluation and management service by telephone and the availability of in person appointments. I also discussed with the patient that there may be a patient responsible charge related to the service.  The patient expressed understanding and agrees to proceed.    Chief Complaint  Patient presents with  . Telephone Assessment  . Telephone Screen  . Medical Management of Chronic Issues    exposure to covid   . Generalized Body Aches  . Headache    HPI  Pt is seen for video visit, She reports she has been having headaches and nausea two days ago.  She reports it feels like her normal sinus infection.  However, her daughter was sent home from work for covid symptoms, and she is awaiting her test results.  She feels like she should be tested for covid, at this time.    Current Medication: Outpatient Encounter Medications as of 08/06/2019  Medication Sig  . ACCU-CHEK AVIVA PLUS test strip   . albuterol (VENTOLIN HFA) 108 (90 Base) MCG/ACT inhaler Inhale into the lungs.  . Blood Glucose Monitoring Suppl (ACCU-CHEK AVIVA) device Use as instructed to test blood sugar 1 time daily. DX: E11.9  . conjugated estrogens (PREMARIN) vaginal cream Place 1 Applicatorful vaginally daily. Twice a week  . cyclobenzaprine (FLEXERIL) 10 MG tablet Take 10 mg by mouth 3 (three) times daily as needed for muscle spasms.  Marland Kitchen FLUoxetine (PROZAC) 20 MG capsule Take 1 capsule (20 mg total) by mouth daily.  . fluticasone (FLONASE) 50 MCG/ACT nasal spray Place 2 sprays into both nostrils daily.  . furosemide (LASIX) 20 MG tablet Take 20 mg by  mouth. Take 1/2 tab as needed  . HYDROmorphone (DILAUDID) 4 MG tablet Take by mouth every 6 (six) hours as needed for severe pain.  . Ipratropium-Albuterol (COMBIVENT RESPIMAT) 20-100 MCG/ACT AERS respimat Inhale 1 puff into the lungs every 6 (six) hours.  Marland Kitchen liraglutide (VICTOZA) 18 MG/3ML SOPN Inject 1.2 mg into the skin.   . meloxicam (MOBIC) 15 MG tablet Take 15 mg by mouth daily.  . metFORMIN (GLUCOPHAGE) 500 MG tablet Take 1,000 mg by mouth 2 (two) times daily with a meal.  . Methylnaltrexone Bromide (RELISTOR) 150 MG TABS Take 3 tablets by mouth.  . morphine (MS CONTIN) 60 MG 12 hr tablet Take 60 mg by mouth every 12 (twelve) hours.  . pravastatin (PRAVACHOL) 10 MG tablet Take 10 mg by mouth daily.  . pregabalin (LYRICA) 150 MG capsule Take 150 mg by mouth 3 (three) times daily.   . promethazine (PHENERGAN) 25 MG tablet Take 25 mg by mouth every 6 (six) hours as needed for nausea or vomiting.  . sulfamethoxazole-trimethoprim (BACTRIM) 400-80 MG tablet Take 1 tablet by mouth 2 (two) times daily.  . SUMAtriptan (IMITREX) 100 MG tablet Take 100 mg by mouth every 2 (two) hours as needed for migraine. May repeat in 2 hours if headache persists or recurs.   No facility-administered encounter medications on file as of 08/06/2019.     Surgical History: Past Surgical History:  Procedure Laterality Date  . ABDOMINAL HYSTERECTOMY    . CARPAL TUNNEL RELEASE    .  KNEE SURGERY    . SPINAL FUSION    . SPINAL FUSION      Medical History: Past Medical History:  Diagnosis Date  . Actinic keratitis   . Allergy   . Anxiety   . Asthma   . Basal cell carcinoma   . Chronic venous insufficiency   . DDD (degenerative disc disease), lumbar   . Diabetes mellitus without complication ([place])   . Fibromyalgia   . Fibromyalgia   . Hyperlipidemia   . Lymphedema   . Melanoma ([place])   . Migraine   . Mitral valve prolapse     Family History: Family History  Problem Relation Age of Onset  . Cancer  Paternal Aunt   . Cancer Paternal Uncle   . Diabetes Paternal Grandfather   . Heart disease Paternal Grandfather     Social History   Socioeconomic History  . Marital status: Divorced    Spouse name: Not on file  . Number of children: Not on file  . Years of education: Not on file  . Highest education level: Not on file  Occupational History  . Not on file  Social Needs  . Financial resource strain: Not on file  . Food insecurity    Worry: Not on file    Inability: Not on file  . Transportation needs    Medical: Not on file    Non-medical: Not on file  Tobacco Use  . Smoking status: Never Smoker  . Smokeless tobacco: Never Used  Substance and Sexual Activity  . Alcohol use: No  . Drug use: No  . Sexual activity: Not on file  Lifestyle  . Physical activity    Days per week: Not on file    Minutes per session: Not on file  . Stress: Not on file  Relationships  . Social Herbalist on phone: Not on file    Gets together: Not on file    Attends religious service: Not on file    Active member of club or organization: Not on file    Attends meetings of clubs or organizations: Not on file    Relationship status: Not on file  . Intimate partner violence    Fear of current or ex partner: Not on file    Emotionally abused: Not on file    Physically abused: Not on file    Forced sexual activity: Not on file  Other Topics Concern  . Not on file  Social History Narrative  . Not on file      Review of Systems  Constitutional: Negative for chills, fatigue and unexpected weight change.  HENT: Negative for congestion, rhinorrhea, sneezing and sore throat.   Eyes: Negative for photophobia, pain and redness.  Respiratory: Negative for cough, chest tightness and shortness of breath.   Cardiovascular: Negative for chest pain and palpitations.  Gastrointestinal: Negative for abdominal pain, constipation, diarrhea, nausea and vomiting.  Endocrine: Negative.    Genitourinary: Negative for dysuria and frequency.  Musculoskeletal: Negative for arthralgias, back pain, joint swelling and neck pain.  Skin: Negative for rash.  Allergic/Immunologic: Negative.   Neurological: Negative for tremors and numbness.  Hematological: Negative for adenopathy. Does not bruise/bleed easily.  Psychiatric/Behavioral: Negative for behavioral problems and sleep disturbance. The patient is not nervous/anxious.     Vital Signs: Temp 98.7 F (37.1 C)   Ht 5\' 1"  (1.549 m)   Wt 160 lb (72.6 kg)   LMP 12/13/1991   BMI 30.23 kg/m  Observation/Objective: Well appearing, NAD noted at this time.   Assessment/Plan: 1. Acute maxillary sinusitis, recurrence not specified Advised patient to take entire course of antibiotics as prescribed with food. Pt should return to clinic in 7-10 days if symptoms fail to improve or new symptoms develop.  - sulfamethoxazole-trimethoprim (BACTRIM) 400-80 MG tablet; Take 1 tablet by mouth 2 (two) times daily.  Dispense: 20 tablet; Refill: 0  2. Close Exposure to Covid-19 Virus - Novel Coronavirus, NAA ([facility])  General Counseling: Lamona verbalizes understanding of the findings of today's phone visit and agrees with plan of treatment. I have discussed any further diagnostic evaluation that may be needed or ordered today. We also reviewed her medications today. she has been encouraged to call the office with any questions or concerns that should arise related to todays visit.    Orders Placed This Encounter  Procedures  . Novel Coronavirus, NAA ([facility])    Meds ordered this encounter  Medications  . sulfamethoxazole-trimethoprim (BACTRIM) 400-80 MG tablet    Sig: Take 1 tablet by mouth 2 (two) times daily.    Dispense:  20 tablet    Refill:  0    Time spent: [place] AGNP-C Internal medicine

## 2019-08-07 ENCOUNTER — Encounter: Payer: Self-pay | Admitting: Internal Medicine

## 2019-08-07 LAB — NOVEL CORONAVIRUS, NAA: SARS-CoV-2, NAA: NOT DETECTED

## 2019-08-13 ENCOUNTER — Other Ambulatory Visit: Payer: Self-pay

## 2019-08-13 DIAGNOSIS — M5442 Lumbago with sciatica, left side: Secondary | ICD-10-CM | POA: Diagnosis not present

## 2019-08-13 DIAGNOSIS — M5441 Lumbago with sciatica, right side: Secondary | ICD-10-CM | POA: Diagnosis not present

## 2019-08-13 DIAGNOSIS — F339 Major depressive disorder, recurrent, unspecified: Secondary | ICD-10-CM

## 2019-08-13 DIAGNOSIS — M9904 Segmental and somatic dysfunction of sacral region: Secondary | ICD-10-CM | POA: Diagnosis not present

## 2019-08-13 DIAGNOSIS — M9903 Segmental and somatic dysfunction of lumbar region: Secondary | ICD-10-CM | POA: Diagnosis not present

## 2019-08-13 MED ORDER — FLUOXETINE HCL 20 MG PO CAPS: 20.0000 mg | ORAL_CAPSULE | Freq: Every day | ORAL | 1 refills | Status: DC

## 2019-08-20 ENCOUNTER — Other Ambulatory Visit: Payer: Self-pay

## 2019-08-20 ENCOUNTER — Ambulatory Visit: Payer: Medicare Other | Admitting: Internal Medicine

## 2019-08-20 DIAGNOSIS — G894 Chronic pain syndrome: Secondary | ICD-10-CM | POA: Diagnosis not present

## 2019-08-20 DIAGNOSIS — M25559 Pain in unspecified hip: Secondary | ICD-10-CM | POA: Diagnosis not present

## 2019-08-20 DIAGNOSIS — J4520 Mild intermittent asthma, uncomplicated: Secondary | ICD-10-CM | POA: Diagnosis not present

## 2019-08-20 DIAGNOSIS — Z79891 Long term (current) use of opiate analgesic: Secondary | ICD-10-CM | POA: Diagnosis not present

## 2019-08-20 DIAGNOSIS — M5442 Lumbago with sciatica, left side: Secondary | ICD-10-CM | POA: Diagnosis not present

## 2019-08-20 DIAGNOSIS — M25569 Pain in unspecified knee: Secondary | ICD-10-CM | POA: Diagnosis not present

## 2019-08-20 DIAGNOSIS — M5441 Lumbago with sciatica, right side: Secondary | ICD-10-CM | POA: Diagnosis not present

## 2019-08-20 DIAGNOSIS — M9903 Segmental and somatic dysfunction of lumbar region: Secondary | ICD-10-CM | POA: Diagnosis not present

## 2019-08-20 DIAGNOSIS — M9904 Segmental and somatic dysfunction of sacral region: Secondary | ICD-10-CM | POA: Diagnosis not present

## 2019-08-20 DIAGNOSIS — M545 Low back pain: Secondary | ICD-10-CM | POA: Diagnosis not present

## 2019-08-20 LAB — PULMONARY FUNCTION TEST

## 2019-08-29 NOTE — Procedures (Signed)
[place], [address]  DATE OF SERVICE: August 20, 2019  Complete Pulmonary Function Testing Interpretation:  FINDINGS:  Forced vital capacity is normal the FEV1 is normal FEV1 FVC ratio is normal.  Postbronchodilator there is no significant change in the FEV1 however clinical improvement may occur in the absence of spirometric improvement.  Total lung capacity is increased residual volume is increased residual anti-lung capacity ratio is increased thoracic gas volume is increased.  The DLCO is increased.  IMPRESSION:  This spirometry is within normal limits lung volumes show increased volumes consider air gas trapping.  DLCO is within normal limits.  Clinical correlation recommended  Allyne Gee, MD [facility] Pulmonary Critical Care Medicine Sleep Medicine

## 2019-09-01 ENCOUNTER — Ambulatory Visit (INDEPENDENT_AMBULATORY_CARE_PROVIDER_SITE_OTHER): Payer: Medicare Other | Admitting: Adult Health

## 2019-09-01 ENCOUNTER — Encounter: Payer: Self-pay | Admitting: Adult Health

## 2019-09-01 ENCOUNTER — Other Ambulatory Visit: Payer: Self-pay

## 2019-09-01 VITALS — BP 128/84 | HR 111 | Resp 16 | Ht 61.0 in | Wt 161.0 lb

## 2019-09-01 DIAGNOSIS — E119 Type 2 diabetes mellitus without complications: Secondary | ICD-10-CM

## 2019-09-01 DIAGNOSIS — M5441 Lumbago with sciatica, right side: Secondary | ICD-10-CM | POA: Diagnosis not present

## 2019-09-01 DIAGNOSIS — R300 Dysuria: Secondary | ICD-10-CM

## 2019-09-01 DIAGNOSIS — M9903 Segmental and somatic dysfunction of lumbar region: Secondary | ICD-10-CM | POA: Diagnosis not present

## 2019-09-01 DIAGNOSIS — J4520 Mild intermittent asthma, uncomplicated: Secondary | ICD-10-CM

## 2019-09-01 DIAGNOSIS — M797 Fibromyalgia: Secondary | ICD-10-CM

## 2019-09-01 DIAGNOSIS — F339 Major depressive disorder, recurrent, unspecified: Secondary | ICD-10-CM

## 2019-09-01 DIAGNOSIS — E1165 Type 2 diabetes mellitus with hyperglycemia: Secondary | ICD-10-CM

## 2019-09-01 DIAGNOSIS — R110 Nausea: Secondary | ICD-10-CM

## 2019-09-01 DIAGNOSIS — Z1239 Encounter for other screening for malignant neoplasm of breast: Secondary | ICD-10-CM

## 2019-09-01 DIAGNOSIS — Z0100 Encounter for examination of eyes and vision without abnormal findings: Secondary | ICD-10-CM

## 2019-09-01 DIAGNOSIS — M5442 Lumbago with sciatica, left side: Secondary | ICD-10-CM | POA: Diagnosis not present

## 2019-09-01 DIAGNOSIS — Z0001 Encounter for general adult medical examination with abnormal findings: Secondary | ICD-10-CM

## 2019-09-01 DIAGNOSIS — M9904 Segmental and somatic dysfunction of sacral region: Secondary | ICD-10-CM | POA: Diagnosis not present

## 2019-09-01 MED ORDER — PANTOPRAZOLE SODIUM 40 MG PO TBEC: 40.0000 mg | DELAYED_RELEASE_TABLET | Freq: Every day | ORAL | 3 refills | Status: DC

## 2019-09-01 MED ORDER — PROMETHAZINE HCL 25 MG PO TABS: 25.0000 mg | ORAL_TABLET | Freq: Four times a day (QID) | ORAL | 0 refills | Status: DC | PRN

## 2019-09-01 NOTE — Progress Notes (Signed)
[facility], [facility] [place], [place] [address]  Internal MEDICINE  Office Visit Note  Patient Name: [name] [name]  [id number]  BB:[id number]  Date of Service: 09/01/2019  Chief Complaint  Patient presents with  . Medical Management of Chronic Issues  . Annual Exam  . Diabetes  . Fibromyalgia  . Heartburn  . Urinary Tract Infection    possible urinary tract , having difficulty urinating  . Quality Metric Gaps    eye exam     HPI Pt is here for routine health maintenance examination.  She is a well appearing 43 yo female.  She has a history of DM, Fibromyalgia and GERD.  Today she is complaining of some excessive GERD since Wednesday.  She has been taking her daughters Protonix.  She took Protonix before, and now believes she should start back.  She is also reports some urinary difficulty.  She reports having to "force" the urine out.  This has been going on for about a year.  Her urine dip today shows blood today.    Current Medication: Outpatient Encounter Medications as of 09/01/2019  Medication Sig  . ACCU-CHEK AVIVA PLUS test strip   . albuterol (VENTOLIN HFA) 108 (90 Base) MCG/ACT inhaler Inhale into the lungs.  . Blood Glucose Monitoring Suppl (ACCU-CHEK AVIVA) device Use as instructed to test blood sugar 1 time daily. DX: E11.9  . conjugated estrogens (PREMARIN) vaginal cream Place 1 Applicatorful vaginally daily. Twice a week  . cyclobenzaprine (FLEXERIL) 10 MG tablet Take 10 mg by mouth 3 (three) times daily as needed for muscle spasms.  Marland Kitchen FLUoxetine (PROZAC) 20 MG capsule Take 1 capsule (20 mg total) by mouth daily.  . fluticasone (FLONASE) 50 MCG/ACT nasal spray Place 2 sprays into both nostrils daily.  . furosemide (LASIX) 20 MG tablet Take 20 mg by mouth. Take 1/2 tab as needed  . HYDROmorphone (DILAUDID) 4 MG tablet Take by mouth every 6 (six) hours as needed for severe pain.  . Ipratropium-Albuterol (COMBIVENT RESPIMAT) 20-100 MCG/ACT AERS  respimat Inhale 1 puff into the lungs every 6 (six) hours.  Marland Kitchen liraglutide (VICTOZA) 18 MG/3ML SOPN Inject 1.2 mg into the skin.   . meloxicam (MOBIC) 15 MG tablet Take 15 mg by mouth daily.  . metFORMIN (GLUCOPHAGE) 500 MG tablet Take 1,000 mg by mouth 2 (two) times daily with a meal.  . Methylnaltrexone Bromide (RELISTOR) 150 MG TABS Take 3 tablets by mouth.  . morphine (MS CONTIN) 60 MG 12 hr tablet Take 60 mg by mouth every 12 (twelve) hours.  . pantoprazole (PROTONIX) 40 MG tablet Take 1 tablet (40 mg total) by mouth daily.  . pravastatin (PRAVACHOL) 10 MG tablet Take 10 mg by mouth daily.  . pregabalin (LYRICA) 150 MG capsule Take 150 mg by mouth 3 (three) times daily.   . promethazine (PHENERGAN) 25 MG tablet Take 1 tablet (25 mg total) by mouth every 6 (six) hours as needed for nausea or vomiting.  . SUMAtriptan (IMITREX) 100 MG tablet Take 100 mg by mouth every 2 (two) hours as needed for migraine. May repeat in 2 hours if headache persists or recurs.  . [DISCONTINUED] promethazine (PHENERGAN) 25 MG tablet Take 25 mg by mouth every 6 (six) hours as needed for nausea or vomiting.  . [DISCONTINUED] sulfamethoxazole-trimethoprim (BACTRIM) 400-80 MG tablet Take 1 tablet by mouth 2 (two) times daily. (Patient not taking: Reported on 09/01/2019)   No facility-administered encounter medications on file as of 09/01/2019.  Surgical History: Past Surgical History:  Procedure Laterality Date  . ABDOMINAL HYSTERECTOMY    . CARPAL TUNNEL RELEASE    . KNEE SURGERY    . SPINAL FUSION    . SPINAL FUSION      Medical History: Past Medical History:  Diagnosis Date  . Actinic keratitis   . Allergy   . Anxiety   . Asthma   . Basal cell carcinoma   . Chronic venous insufficiency   . DDD (degenerative disc disease), lumbar   . Diabetes mellitus without complication ([place])   . Fibromyalgia   . Fibromyalgia   . Hyperlipidemia   . Lymphedema   . Melanoma ([place])   . Migraine   . Mitral valve  prolapse     Family History: Family History  Problem Relation Age of Onset  . Cancer Paternal Aunt   . Cancer Paternal Uncle   . Diabetes Paternal Grandfather   . Heart disease Paternal Grandfather       Review of Systems  Constitutional: Negative for chills, fatigue and unexpected weight change.  HENT: Negative for congestion, rhinorrhea, sneezing and sore throat.   Eyes: Negative for photophobia, pain and redness.  Respiratory: Negative for cough, chest tightness and shortness of breath.   Cardiovascular: Negative for chest pain and palpitations.  Gastrointestinal: Negative for abdominal pain, constipation, diarrhea, nausea and vomiting.  Endocrine: Negative.   Genitourinary: Negative for dysuria and frequency.  Musculoskeletal: Negative for arthralgias, back pain, joint swelling and neck pain.  Skin: Negative for rash.  Allergic/Immunologic: Negative.   Neurological: Negative for tremors and numbness.  Hematological: Negative for adenopathy. Does not bruise/bleed easily.  Psychiatric/Behavioral: Negative for behavioral problems and sleep disturbance. The patient is not nervous/anxious.      Vital Signs: BP 128/84 (BP Location: Left Arm, Patient Position: Sitting, Cuff Size: Normal)   Pulse (!) 111   Resp 16   Ht 5\' 1"  (1.549 m)   Wt 161 lb (73 kg)   LMP 12/13/1991   SpO2 97%   BMI 30.42 kg/m    Physical Exam Vitals signs and nursing note reviewed. Exam conducted with a chaperone present.  Constitutional:      General: She is not in acute distress.    Appearance: She is well-developed. She is not diaphoretic.  HENT:     Head: Normocephalic and atraumatic.     Mouth/Throat:     Pharynx: No oropharyngeal exudate.  Eyes:     Pupils: Pupils are equal, round, and reactive to light.  Neck:     Musculoskeletal: Normal range of motion and neck supple.     Thyroid: No thyromegaly.     Vascular: No JVD.     Trachea: No tracheal deviation.  Cardiovascular:     Rate  and Rhythm: Normal rate and regular rhythm.     Heart sounds: Normal heart sounds. No murmur. No friction rub. No gallop.   Pulmonary:     Effort: Pulmonary effort is normal. No respiratory distress.     Breath sounds: Normal breath sounds. No wheezing or rales.  Chest:     Chest wall: No tenderness.     Breasts:        Right: Tenderness present. No swelling, bleeding, inverted nipple, mass, nipple discharge or skin change.        Left: No swelling, bleeding, inverted nipple, mass, nipple discharge, skin change or tenderness.     Comments: Exam Chaperoned by Dorna Bloom CMA Abdominal:     Palpations: Abdomen  is soft.     Tenderness: There is no abdominal tenderness. There is no guarding.  Musculoskeletal: Normal range of motion.  Lymphadenopathy:     Cervical: No cervical adenopathy.  Skin:    General: Skin is warm and dry.  Neurological:     Mental Status: She is alert and oriented to person, place, and time.     Cranial Nerves: No cranial nerve deficit.  Psychiatric:        Behavior: Behavior normal.        Thought Content: Thought content normal.        Judgment: Judgment normal.      LABS: Recent Results (from the past 2160 hour(s))  POCT HgB A1C     Status: Abnormal   Collection Time: 07/22/19  2:25 PM  Result Value Ref Range   Hemoglobin A1C 6.1 (A) 4.0 - 5.6 %   HbA1c POC (<> result, manual entry)     HbA1c, POC (prediabetic range)     HbA1c, POC (controlled diabetic range)    Novel Coronavirus, NAA ([facility])     Status: None   Collection Time: 08/06/19  2:50 PM   Specimen: Oropharyngeal(OP) collection in vial transport medium   OROPHARYNGEA  TESTING  Result Value Ref Range   SARS-CoV-2, NAA Not Detected Not Detected    Comment: Testing was performed using the cobas(R) SARS-CoV-2 test. This test was developed and its performance characteristics determined by [employer]. This test has not been FDA cleared or approved. This test has been authorized by  FDA under an Emergency Use Authorization (EUA). This test is only authorized for the duration of time the declaration that circumstances exist justifying the authorization of the emergency use of in vitro diagnostic tests for detection of SARS-CoV-2 virus and/or diagnosis of COVID-19 infection under section 564(b)(1) of the Act, 21 U.S.C. [id number]), unless the authorization is terminated or revoked sooner. When diagnostic testing is negative, the possibility of a false negative result should be considered in the context of a patient's recent exposures and the presence of clinical signs and symptoms consistent with COVID-19. An individual without symptoms of COVID-19 and who is not shedding SARS-CoV-2 virus would expect to have a negati ve (not detected) result in this assay.      Assessment/Plan: 1. Encounter for general adult medical examination with abnormal findings Up to date on PHM, Mammo, and eye exam ordred - CBC with Differential/Platelet - Lipid Panel With LDL/HDL Ratio - TSH - T4, free - Comprehensive metabolic panel - FSH/LH  2. Uncontrolled type 2 diabetes mellitus with hyperglycemia (HCC) Overall well controlled, continue current medications.  - Microalbumin, urine  3. Mild intermittent asthma, unspecified whether complicated Stable, continue current management.   4. Fibromyalgia Stable, pt sees pain mgmt.   5. Depression, recurrent ([place]) Well controlled with Prozac, continue current therapy.   6. Diabetic eye exam ([place]) - Ambulatory referral to Optometry  7. Screening for breast cancer - MM DIGITAL SCREENING BILATERAL; Future  8. Dysuria - UA/M w/rflx Culture, Routine  9. Nausea Refilled phenergan, use as discussed.  - promethazine (PHENERGAN) 25 MG tablet; Take 1 tablet (25 mg total) by mouth every 6 (six) hours as needed for nausea or vomiting.  Dispense: 30 tablet; Refill: 0  General Counseling: Quinesha verbalizes understanding of the  findings of todays visit and agrees with plan of treatment. I have discussed any further diagnostic evaluation that may be needed or ordered today. We also reviewed her medications today. she has been encouraged  to call the office with any questions or concerns that should arise related to todays visit.   Orders Placed This Encounter  Procedures  . MM DIGITAL SCREENING BILATERAL  . UA/M w/rflx Culture, Routine  . Microalbumin, urine  . CBC with Differential/Platelet  . Lipid Panel With LDL/HDL Ratio  . TSH  . T4, free  . Comprehensive metabolic panel  . FSH/LH  . Ambulatory referral to Optometry    Meds ordered this encounter  Medications  . promethazine (PHENERGAN) 25 MG tablet    Sig: Take 1 tablet (25 mg total) by mouth every 6 (six) hours as needed for nausea or vomiting.    Dispense:  30 tablet    Refill:  0  . pantoprazole (PROTONIX) 40 MG tablet    Sig: Take 1 tablet (40 mg total) by mouth daily.    Dispense:  30 tablet    Refill:  3    Time spent: 15 Minutes   This patient was seen by Orson Gear AGNP-C in Collaboration with Dr Lavera Guise as a part of collaborative care agreement    Kendell Bane AGNP-C Internal Medicine

## 2019-09-02 LAB — UA/M W/RFLX CULTURE, ROUTINE
Bilirubin, UA: NEGATIVE
Glucose, UA: NEGATIVE
Ketones, UA: NEGATIVE
Leukocytes,UA: NEGATIVE
Nitrite, UA: NEGATIVE
Protein,UA: NEGATIVE
RBC, UA: NEGATIVE
Specific Gravity, UA: 1.02 (ref 1.005–1.030)
Urobilinogen, Ur: 0.2 mg/dL (ref 0.2–1.0)
pH, UA: 5.5 (ref 5.0–7.5)

## 2019-09-02 LAB — MICROALBUMIN, URINE: Microalbumin, Urine: 4.1 ug/mL

## 2019-09-02 LAB — MICROSCOPIC EXAMINATION: Casts: NONE SEEN /lpf

## 2019-09-03 DIAGNOSIS — M9904 Segmental and somatic dysfunction of sacral region: Secondary | ICD-10-CM | POA: Diagnosis not present

## 2019-09-03 DIAGNOSIS — M9903 Segmental and somatic dysfunction of lumbar region: Secondary | ICD-10-CM | POA: Diagnosis not present

## 2019-09-03 DIAGNOSIS — M5442 Lumbago with sciatica, left side: Secondary | ICD-10-CM | POA: Diagnosis not present

## 2019-09-03 DIAGNOSIS — M5441 Lumbago with sciatica, right side: Secondary | ICD-10-CM | POA: Diagnosis not present

## 2019-09-08 DIAGNOSIS — M5441 Lumbago with sciatica, right side: Secondary | ICD-10-CM | POA: Diagnosis not present

## 2019-09-08 DIAGNOSIS — M5442 Lumbago with sciatica, left side: Secondary | ICD-10-CM | POA: Diagnosis not present

## 2019-09-08 DIAGNOSIS — M9903 Segmental and somatic dysfunction of lumbar region: Secondary | ICD-10-CM | POA: Diagnosis not present

## 2019-09-08 DIAGNOSIS — M9904 Segmental and somatic dysfunction of sacral region: Secondary | ICD-10-CM | POA: Diagnosis not present

## 2019-09-15 DIAGNOSIS — M5442 Lumbago with sciatica, left side: Secondary | ICD-10-CM | POA: Diagnosis not present

## 2019-09-15 DIAGNOSIS — M9904 Segmental and somatic dysfunction of sacral region: Secondary | ICD-10-CM | POA: Diagnosis not present

## 2019-09-15 DIAGNOSIS — Z79891 Long term (current) use of opiate analgesic: Secondary | ICD-10-CM | POA: Diagnosis not present

## 2019-09-15 DIAGNOSIS — M545 Low back pain: Secondary | ICD-10-CM | POA: Diagnosis not present

## 2019-09-15 DIAGNOSIS — M5441 Lumbago with sciatica, right side: Secondary | ICD-10-CM | POA: Diagnosis not present

## 2019-09-15 DIAGNOSIS — G894 Chronic pain syndrome: Secondary | ICD-10-CM | POA: Diagnosis not present

## 2019-09-15 DIAGNOSIS — M9903 Segmental and somatic dysfunction of lumbar region: Secondary | ICD-10-CM | POA: Diagnosis not present

## 2019-09-15 DIAGNOSIS — M25559 Pain in unspecified hip: Secondary | ICD-10-CM | POA: Diagnosis not present

## 2019-09-15 DIAGNOSIS — M25569 Pain in unspecified knee: Secondary | ICD-10-CM | POA: Diagnosis not present

## 2019-09-18 ENCOUNTER — Ambulatory Visit: Payer: Medicare Other | Admitting: Adult Health

## 2019-09-24 DIAGNOSIS — M9903 Segmental and somatic dysfunction of lumbar region: Secondary | ICD-10-CM | POA: Diagnosis not present

## 2019-09-24 DIAGNOSIS — M5441 Lumbago with sciatica, right side: Secondary | ICD-10-CM | POA: Diagnosis not present

## 2019-09-24 DIAGNOSIS — M5442 Lumbago with sciatica, left side: Secondary | ICD-10-CM | POA: Diagnosis not present

## 2019-09-24 DIAGNOSIS — Z0001 Encounter for general adult medical examination with abnormal findings: Secondary | ICD-10-CM | POA: Diagnosis not present

## 2019-09-24 DIAGNOSIS — M9904 Segmental and somatic dysfunction of sacral region: Secondary | ICD-10-CM | POA: Diagnosis not present

## 2019-09-24 DIAGNOSIS — E756 Lipid storage disorder, unspecified: Secondary | ICD-10-CM | POA: Diagnosis not present

## 2019-09-24 DIAGNOSIS — E0789 Other specified disorders of thyroid: Secondary | ICD-10-CM | POA: Diagnosis not present

## 2019-09-25 LAB — COMPREHENSIVE METABOLIC PANEL
ALT: 26 IU/L (ref 0–32)
AST: 24 IU/L (ref 0–40)
Albumin/Globulin Ratio: 1.7 (ref 1.2–2.2)
Albumin: 4.3 g/dL (ref 3.8–4.8)
Alkaline Phosphatase: 84 IU/L (ref 39–117)
BUN/Creatinine Ratio: 17 (ref 9–23)
BUN: 14 mg/dL (ref 6–24)
Bilirubin Total: 0.3 mg/dL (ref 0.0–1.2)
CO2: 22 mmol/L (ref 20–29)
Calcium: 9.4 mg/dL (ref 8.7–10.2)
Chloride: 99 mmol/L (ref 96–106)
Creatinine, Ser: 0.84 mg/dL (ref 0.57–1.00)
GFR calc Af Amer: 98 mL/min/{1.73_m2} (ref >59)
GFR calc non Af Amer: 85 mL/min/{1.73_m2} (ref >59)
Globulin, Total: 2.5 g/dL (ref 1.5–4.5)
Glucose: 131 mg/dL — ABNORMAL HIGH (ref 65–99)
Potassium: 4.2 mmol/L (ref 3.5–5.2)
Sodium: 138 mmol/L (ref 134–144)
Total Protein: 6.8 g/dL (ref 6.0–8.5)

## 2019-09-25 LAB — FSH/LH
FSH: 8.6 m[IU]/mL
LH: 3.8 m[IU]/mL

## 2019-09-25 LAB — CBC WITH DIFFERENTIAL/PLATELET
Basophils Absolute: 0.1 10*3/uL (ref 0.0–0.2)
Basos: 1 %
EOS (ABSOLUTE): 0.3 10*3/uL (ref 0.0–0.4)
Eos: 6 %
Hematocrit: 40.4 % (ref 34.0–46.6)
Hemoglobin: 13.7 g/dL (ref 11.1–15.9)
Immature Grans (Abs): 0 10*3/uL (ref 0.0–0.1)
Immature Granulocytes: 0 %
Lymphocytes Absolute: 1.8 10*3/uL (ref 0.7–3.1)
Lymphs: 36 %
MCH: 29.7 pg (ref 26.6–33.0)
MCHC: 33.9 g/dL (ref 31.5–35.7)
MCV: 88 fL (ref 79–97)
Monocytes Absolute: 0.4 10*3/uL (ref 0.1–0.9)
Monocytes: 7 %
Neutrophils Absolute: 2.5 10*3/uL (ref 1.4–7.0)
Neutrophils: 50 %
Platelets: 321 10*3/uL (ref 150–450)
RBC: 4.61 x10E6/uL (ref 3.77–5.28)
RDW: 12.6 % (ref 11.7–15.4)
WBC: 5 10*3/uL (ref 3.4–10.8)

## 2019-09-25 LAB — LIPID PANEL WITH LDL/HDL RATIO
Cholesterol, Total: 239 mg/dL — ABNORMAL HIGH (ref 100–199)
HDL: 43 mg/dL (ref >39)
LDL Chol Calc (NIH): 132 mg/dL — ABNORMAL HIGH (ref 0–99)
LDL/HDL Ratio: 3.1 ratio (ref 0.0–3.2)
Triglycerides: 358 mg/dL — ABNORMAL HIGH (ref 0–149)
VLDL Cholesterol Cal: 64 mg/dL — ABNORMAL HIGH (ref 5–40)

## 2019-09-25 LAB — TSH: TSH: 5.21 u[IU]/mL — ABNORMAL HIGH (ref 0.450–4.500)

## 2019-09-25 LAB — T4, FREE: Free T4: 0.94 ng/dL (ref 0.82–1.77)

## 2019-09-29 DIAGNOSIS — M9904 Segmental and somatic dysfunction of sacral region: Secondary | ICD-10-CM | POA: Diagnosis not present

## 2019-09-29 DIAGNOSIS — M5442 Lumbago with sciatica, left side: Secondary | ICD-10-CM | POA: Diagnosis not present

## 2019-09-29 DIAGNOSIS — M5441 Lumbago with sciatica, right side: Secondary | ICD-10-CM | POA: Diagnosis not present

## 2019-09-29 DIAGNOSIS — M9903 Segmental and somatic dysfunction of lumbar region: Secondary | ICD-10-CM | POA: Diagnosis not present

## 2019-10-02 ENCOUNTER — Other Ambulatory Visit: Payer: Self-pay

## 2019-10-02 ENCOUNTER — Ambulatory Visit (INDEPENDENT_AMBULATORY_CARE_PROVIDER_SITE_OTHER): Payer: Medicare Other | Admitting: Internal Medicine

## 2019-10-02 ENCOUNTER — Encounter: Payer: Self-pay | Admitting: Internal Medicine

## 2019-10-02 DIAGNOSIS — G479 Sleep disorder, unspecified: Secondary | ICD-10-CM

## 2019-10-02 DIAGNOSIS — M5442 Lumbago with sciatica, left side: Secondary | ICD-10-CM | POA: Diagnosis not present

## 2019-10-02 DIAGNOSIS — G8929 Other chronic pain: Secondary | ICD-10-CM

## 2019-10-02 DIAGNOSIS — E1165 Type 2 diabetes mellitus with hyperglycemia: Secondary | ICD-10-CM | POA: Diagnosis not present

## 2019-10-02 DIAGNOSIS — M5441 Lumbago with sciatica, right side: Secondary | ICD-10-CM

## 2019-10-02 DIAGNOSIS — I059 Rheumatic mitral valve disease, unspecified: Secondary | ICD-10-CM

## 2019-10-02 DIAGNOSIS — N952 Postmenopausal atrophic vaginitis: Secondary | ICD-10-CM | POA: Diagnosis not present

## 2019-10-02 DIAGNOSIS — E039 Hypothyroidism, unspecified: Secondary | ICD-10-CM

## 2019-10-02 MED ORDER — LEVOTHYROXINE SODIUM 50 MCG PO TABS: 50.0000 ug | ORAL_TABLET | Freq: Every day | ORAL | 3 refills | Status: DC
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
## 2019-10-06 ENCOUNTER — Telehealth: Payer: Self-pay

## 2019-10-08 DIAGNOSIS — M9904 Segmental and somatic dysfunction of sacral region: Secondary | ICD-10-CM | POA: Diagnosis not present

## 2019-10-08 DIAGNOSIS — M9903 Segmental and somatic dysfunction of lumbar region: Secondary | ICD-10-CM | POA: Diagnosis not present

## 2019-10-08 DIAGNOSIS — M5442 Lumbago with sciatica, left side: Secondary | ICD-10-CM | POA: Diagnosis not present

## 2019-10-08 DIAGNOSIS — M5441 Lumbago with sciatica, right side: Secondary | ICD-10-CM | POA: Diagnosis not present

## 2019-10-09 NOTE — Telephone Encounter (Signed)
Patient advised. [name] [name]

## 2019-10-13 DIAGNOSIS — M9903 Segmental and somatic dysfunction of lumbar region: Secondary | ICD-10-CM | POA: Diagnosis not present

## 2019-10-13 DIAGNOSIS — M5441 Lumbago with sciatica, right side: Secondary | ICD-10-CM | POA: Diagnosis not present

## 2019-10-13 DIAGNOSIS — M9904 Segmental and somatic dysfunction of sacral region: Secondary | ICD-10-CM | POA: Diagnosis not present

## 2019-10-13 DIAGNOSIS — M25559 Pain in unspecified hip: Secondary | ICD-10-CM | POA: Diagnosis not present

## 2019-10-13 DIAGNOSIS — M545 Low back pain: Secondary | ICD-10-CM | POA: Diagnosis not present

## 2019-10-13 DIAGNOSIS — Z79891 Long term (current) use of opiate analgesic: Secondary | ICD-10-CM | POA: Diagnosis not present

## 2019-10-13 DIAGNOSIS — M25569 Pain in unspecified knee: Secondary | ICD-10-CM | POA: Diagnosis not present

## 2019-10-13 DIAGNOSIS — G894 Chronic pain syndrome: Secondary | ICD-10-CM | POA: Diagnosis not present

## 2019-10-13 DIAGNOSIS — M5442 Lumbago with sciatica, left side: Secondary | ICD-10-CM | POA: Diagnosis not present

## 2019-10-14 ENCOUNTER — Other Ambulatory Visit: Payer: Medicare Other | Admitting: Internal Medicine

## 2019-10-14 ENCOUNTER — Other Ambulatory Visit: Payer: Self-pay

## 2019-10-14 DIAGNOSIS — G4719 Other hypersomnia: Secondary | ICD-10-CM

## 2019-10-14 DIAGNOSIS — G4733 Obstructive sleep apnea (adult) (pediatric): Secondary | ICD-10-CM

## 2019-10-15 ENCOUNTER — Other Ambulatory Visit: Payer: Medicare Other | Admitting: Internal Medicine

## 2019-10-16 NOTE — Procedures (Signed)
[facility] [place], [place] [address]  Sleep Specialist: Allyne Gee, MD [facility] Sleep Study Interpretation  Patient Name: CHALYN THACHER Patient MR [id number] DOB:[birth year]  Date of Study: October 14, 2019  Indications for study: Hypersomnia  BMI: 30.9 kg/m       Respiratory Data:  Total AHI: 5.0/h  Total Obstructive Apneas: 4  Total Central Apneas: 3  Total Mixed Apneas: 0  Total Hypopneas: 40  If the AHI is greater than 5 per hour patient qualifies for PAP evaluation  Oximetry Data:  Oxygen Desaturation Index: 7.7/h  Lowest Desaturation: 63%  Cardiac Data:  Minimum Heart Rate: 86  Maximum Heart Rate: 120   Impression / Diagnosis:  This apnea study is consistent with mild obstructive sleep apnea with an AHI of 5.0/h I would consider doing a auto CPAP titration to determine optimal response to therapy.  GENERAL Recommendations:  1.  Consider Auto PAP with pressure ranges 5-20 cmH20 with download, or facility based PAP Titration Study  2.  Consider PAP interface mask fitted for patient comfort, Heated Humidification & PAP compliance monitoring (1 month, 3 months & 12 months after PAP initiation)  3. Consider treatment with mandibular advancement splint (MAS) or referral to an ENT surgeon for modification to the upper airway if the patient prefers an alternate therapy or the PAP trial is unsuccessful  4. Sleep hygiene measures should be discussed with the patient  5. Behavioral therapy such as weight reduction or smoking cessation as appropriate for the patient  6. Advise patient against the use of alcohol or sedatives in so much as these substances can worsen excessive daytime sleepiness and respiratory disturbances of sleep  7. Advise patient against participating in potentially dangerous activities while drowsy such as operating a motor vehicle, heavy equipment or power tools as it can put them and  others in danger  8. Advise patient of the long term consequences of OSA if left untreated, need for treatment and close follow up  9. Clinical follow up as deemed necessary     This Level III home sleep study was performed using the [employer], a 4 channel screening device subject to limitations. Depending on actual total sleep time, not measured in this study, the AHI (sum of apneas and hypopneas/hr of sleep) and therefore the severity of sleep apnea may be underestimated. As with any single night study, including Level 1 attended PSG, severity of sleep apnea may also be underestimated due to the lack of supine and/or REM sleep.  The interpretation associated with this report is based on normal values and degrees of severity in accordance with AASM parameters and/or estimated from multiple sources in the literature for adults ages 3-80+. These may not agree with the displayed values. The patient's treating physician should use the interpretation and recommendations in conjunction with the overall clinical evaluation and treatment of the patient.  Some of the terminology used in this scored [facility] report was developed several years ago and may not always be in accordance with current nomenclature. This in no way affects the accuracy of the data or the reliability of the interpretation and recommendations.

## 2019-10-20 DIAGNOSIS — M9904 Segmental and somatic dysfunction of sacral region: Secondary | ICD-10-CM | POA: Diagnosis not present

## 2019-10-20 DIAGNOSIS — M5441 Lumbago with sciatica, right side: Secondary | ICD-10-CM | POA: Diagnosis not present

## 2019-10-20 DIAGNOSIS — M9903 Segmental and somatic dysfunction of lumbar region: Secondary | ICD-10-CM | POA: Diagnosis not present

## 2019-10-20 DIAGNOSIS — M5442 Lumbago with sciatica, left side: Secondary | ICD-10-CM | POA: Diagnosis not present

## 2019-10-21 ENCOUNTER — Telehealth: Payer: Self-pay

## 2019-10-21 NOTE — Telephone Encounter (Signed)
Confirmed pt appt. BR °

## 2019-10-23 ENCOUNTER — Other Ambulatory Visit: Payer: Self-pay

## 2019-10-23 ENCOUNTER — Encounter: Payer: Self-pay | Admitting: Internal Medicine

## 2019-10-23 ENCOUNTER — Ambulatory Visit: Payer: Medicare Other | Admitting: Internal Medicine

## 2019-10-23 VITALS — BP 128/93 | HR 98 | Temp 97.9°F | Resp 16 | Ht 61.0 in | Wt 167.0 lb

## 2019-10-23 DIAGNOSIS — G4733 Obstructive sleep apnea (adult) (pediatric): Secondary | ICD-10-CM

## 2019-10-23 DIAGNOSIS — R030 Elevated blood-pressure reading, without diagnosis of hypertension: Secondary | ICD-10-CM

## 2019-10-23 DIAGNOSIS — I059 Rheumatic mitral valve disease, unspecified: Secondary | ICD-10-CM | POA: Diagnosis not present

## 2019-10-23 NOTE — Progress Notes (Signed)
[facility] [place], [place] [address]  Pulmonary Sleep Medicine   Office Visit Note  Patient Name: [name] [name] DOB: Oct 21, 1976 MRN [id number]  Date of Service: 10/23/2019  Complaints/HPI: Pt is here to review sleep study.  She had a sleep study on November 3 and it recorded a total of 4 obstructive apneas, 3 central apneas, and 40 hypopneas.  This gave an overall total AHI of 5.0/h.  This indicates mild obstructive sleep apnea.  A CPAP titration study is recommended to determine optimal response to therapy.  I ordered at this time.  Is also of note patient has a blood pressure today 128/93. She Denies Chest pain, Shortness of breath, palpitations, headache, or blurred vision. She has a history off mitral valve disorder, and has an Echo scheduled for one week from now.   ROS  General: (-) fever, (-) chills, (-) night sweats, (-) weakness Skin: (-) rashes, (-) itching,. Eyes: (-) visual changes, (-) redness, (-) itching. Nose and Sinuses: (-) nasal stuffiness or itchiness, (-) postnasal drip, (-) nosebleeds, (-) sinus trouble. Mouth and Throat: (-) sore throat, (-) hoarseness. Neck: (-) swollen glands, (-) enlarged thyroid, (-) neck pain. Respiratory: - cough, (-) bloody sputum, - shortness of breath, - wheezing. Cardiovascular: - ankle swelling, (-) chest pain. Lymphatic: (-) lymph node enlargement. Neurologic: (-) numbness, (-) tingling. Psychiatric: (-) anxiety, (-) depression   Current Medication: Outpatient Encounter Medications as of 10/23/2019  Medication Sig  . ACCU-CHEK AVIVA PLUS test strip   . albuterol (VENTOLIN HFA) 108 (90 Base) MCG/ACT inhaler Inhale into the lungs.  . Blood Glucose Monitoring Suppl (ACCU-CHEK AVIVA) device Use as instructed to test blood sugar 1 time daily. DX: E11.9  . conjugated estrogens (PREMARIN) vaginal cream Place 1 Applicatorful vaginally daily. Twice a week  . cyclobenzaprine (FLEXERIL) 10 MG tablet Take  10 mg by mouth 3 (three) times daily as needed for muscle spasms.  Marland Kitchen FLUoxetine (PROZAC) 20 MG capsule Take 1 capsule (20 mg total) by mouth daily.  . fluticasone (FLONASE) 50 MCG/ACT nasal spray Place 2 sprays into both nostrils daily.  . furosemide (LASIX) 20 MG tablet Take 20 mg by mouth. Take 1/2 tab as needed  . HYDROmorphone (DILAUDID) 4 MG tablet Take by mouth every 6 (six) hours as needed for severe pain.  . Ipratropium-Albuterol (COMBIVENT RESPIMAT) 20-100 MCG/ACT AERS respimat Inhale 1 puff into the lungs every 6 (six) hours.  Marland Kitchen levothyroxine (SYNTHROID) 50 MCG tablet Take 1 tablet (50 mcg total) by mouth daily.  Marland Kitchen liraglutide (VICTOZA) 18 MG/3ML SOPN Inject 1.2 mg into the skin.   . meloxicam (MOBIC) 15 MG tablet Take 15 mg by mouth daily.  . metFORMIN (GLUCOPHAGE) 500 MG tablet Take 1,000 mg by mouth 2 (two) times daily with a meal.  . Methylnaltrexone Bromide (RELISTOR) 150 MG TABS Take 3 tablets by mouth.  . morphine (MS CONTIN) 60 MG 12 hr tablet Take 60 mg by mouth every 12 (twelve) hours.  . pantoprazole (PROTONIX) 40 MG tablet Take 1 tablet (40 mg total) by mouth daily.  . pravastatin (PRAVACHOL) 10 MG tablet Take 10 mg by mouth daily.  . pregabalin (LYRICA) 150 MG capsule Take 150 mg by mouth 3 (three) times daily.   . promethazine (PHENERGAN) 25 MG tablet Take 1 tablet (25 mg total) by mouth every 6 (six) hours as needed for nausea or vomiting.  . SUMAtriptan (IMITREX) 100 MG tablet Take 100 mg by mouth every 2 (two) hours as  needed for migraine. May repeat in 2 hours if headache persists or recurs.   No facility-administered encounter medications on file as of 10/23/2019.     Surgical History: Past Surgical History:  Procedure Laterality Date  . ABDOMINAL HYSTERECTOMY    . CARPAL TUNNEL RELEASE    . KNEE SURGERY    . SPINAL FUSION    . SPINAL FUSION      Medical History: Past Medical History:  Diagnosis Date  . Actinic keratitis   . Allergy   . Anxiety   .  Asthma   . Basal cell carcinoma   . Chronic venous insufficiency   . DDD (degenerative disc disease), lumbar   . Diabetes mellitus without complication ([place])   . Fibromyalgia   . Fibromyalgia   . Hyperlipidemia   . Lymphedema   . Melanoma ([place])   . Migraine   . Mitral valve prolapse     Family History: Family History  Problem Relation Age of Onset  . Cancer Paternal Aunt   . Cancer Paternal Uncle   . Diabetes Paternal Grandfather   . Heart disease Paternal Grandfather     Social History: Social History   Socioeconomic History  . Marital status: Divorced    Spouse name: Not on file  . Number of children: Not on file  . Years of education: Not on file  . Highest education level: Not on file  Occupational History  . Not on file  Social Needs  . Financial resource strain: Not on file  . Food insecurity    Worry: Not on file    Inability: Not on file  . Transportation needs    Medical: Not on file    Non-medical: Not on file  Tobacco Use  . Smoking status: Never Smoker  . Smokeless tobacco: Never Used  Substance and Sexual Activity  . Alcohol use: No  . Drug use: No  . Sexual activity: Not on file  Lifestyle  . Physical activity    Days per week: Not on file    Minutes per session: Not on file  . Stress: Not on file  Relationships  . Social Herbalist on phone: Not on file    Gets together: Not on file    Attends religious service: Not on file    Active member of club or organization: Not on file    Attends meetings of clubs or organizations: Not on file    Relationship status: Not on file  . Intimate partner violence    Fear of current or ex partner: Not on file    Emotionally abused: Not on file    Physically abused: Not on file    Forced sexual activity: Not on file  Other Topics Concern  . Not on file  Social History Narrative  . Not on file    Vital Signs: Blood pressure (!) 128/93, pulse 98, temperature 97.9 F (36.6 C), resp. rate  16, height 5\' 1"  (1.549 m), weight 167 lb (75.8 kg), last menstrual period 12/13/1991, SpO2 96 %.  Examination: General Appearance: The patient is well-developed, well-nourished, and in no distress. Skin: Gross inspection of skin unremarkable. Head: normocephalic, no gross deformities. Eyes: no gross deformities noted. ENT: ears appear grossly normal no exudates. Neck: Supple. No thyromegaly. No LAD. Respiratory: clear bilaterally. Cardiovascular: Normal S1 and S2 without murmur or rub. Extremities: No cyanosis. pulses are equal. Neurologic: Alert and oriented. No involuntary movements.  LABS: Recent Results (from the past 2160  hour(s))  Novel Coronavirus, NAA ([facility])     Status: None   Collection Time: 08/06/19  2:50 PM   Specimen: Oropharyngeal(OP) collection in vial transport medium   OROPHARYNGEA  TESTING  Result Value Ref Range   SARS-CoV-2, NAA Not Detected Not Detected    Comment: Testing was performed using the cobas(R) SARS-CoV-2 test. This test was developed and its performance characteristics determined by [employer]. This test has not been FDA cleared or approved. This test has been authorized by FDA under an Emergency Use Authorization (EUA). This test is only authorized for the duration of time the declaration that circumstances exist justifying the authorization of the emergency use of in vitro diagnostic tests for detection of SARS-CoV-2 virus and/or diagnosis of COVID-19 infection under section 564(b)(1) of the Act, 21 U.S.C. KA:123727), unless the authorization is terminated or revoked sooner. When diagnostic testing is negative, the possibility of a false negative result should be considered in the context of a patient's recent exposures and the presence of clinical signs and symptoms consistent with COVID-19. An individual without symptoms of COVID-19 and who is not shedding SARS-CoV-2 virus would expect to have a negati ve (not detected)  result in this assay.   Pulmonary Function Test     Status: None   Collection Time: 08/20/19 10:00 AM  Result Value Ref Range   FEV1     FVC     FEV1/FVC     TLC     DLCO    UA/M w/rflx Culture, Routine     Status: None   Collection Time: 09/01/19  8:59 AM   Specimen: Urine   URINE  Result Value Ref Range   Specific Gravity, UA 1.020 1.005 - 1.030   pH, UA 5.5 5.0 - 7.5   Color, UA Yellow Yellow   Appearance Ur Clear Clear   Leukocytes,UA Negative Negative   Protein,UA Negative Negative/Trace   Glucose, UA Negative Negative   Ketones, UA Negative Negative   RBC, UA Negative Negative   Bilirubin, UA Negative Negative   Urobilinogen, Ur 0.2 0.2 - 1.0 mg/dL   Nitrite, UA Negative Negative   Microscopic Examination Comment     Comment: Microscopic follows if indicated.   Microscopic Examination See below:     Comment: Microscopic was indicated and was performed.   Urinalysis Reflex Comment     Comment: This specimen will not reflex to a Urine Culture.  Microalbumin, urine     Status: None   Collection Time: 09/01/19  8:59 AM  Result Value Ref Range   Microalbumin, Urine 4.1 Not Estab. ug/mL  Microscopic Examination     Status: None   Collection Time: 09/01/19  8:59 AM   URINE  Result Value Ref Range   WBC, UA 0-5 0 - 5 /hpf   RBC 0-2 0 - 2 /hpf   Epithelial Cells (non renal) 0-10 0 - 10 /hpf   Casts None seen None seen /lpf   Mucus, UA Present Not Estab.   Bacteria, UA Few None seen/Few  CBC with Differential/Platelet     Status: None   Collection Time: 09/24/19 11:36 AM  Result Value Ref Range   WBC 5.0 3.4 - 10.8 x10E3/uL   RBC 4.61 3.77 - 5.28 x10E6/uL   Hemoglobin 13.7 11.1 - 15.9 g/dL   Hematocrit 40.4 34.0 - 46.6 %   MCV 88 79 - 97 fL   MCH 29.7 26.6 - 33.0 pg   MCHC 33.9 31.5 - 35.7 g/dL  RDW 12.6 11.7 - 15.4 %   Platelets 321 150 - 450 x10E3/uL   Neutrophils 50 Not Estab. %   Lymphs 36 Not Estab. %   Monocytes 7 Not Estab. %   Eos 6 Not Estab. %    Basos 1 Not Estab. %   Neutrophils Absolute 2.5 1.4 - 7.0 x10E3/uL   Lymphocytes Absolute 1.8 0.7 - 3.1 x10E3/uL   Monocytes Absolute 0.4 0.1 - 0.9 x10E3/uL   EOS (ABSOLUTE) 0.3 0.0 - 0.4 x10E3/uL   Basophils Absolute 0.1 0.0 - 0.2 x10E3/uL   Immature Granulocytes 0 Not Estab. %   Immature Grans (Abs) 0.0 0.0 - 0.1 x10E3/uL  Lipid Panel With LDL/HDL Ratio     Status: Abnormal   Collection Time: 09/24/19 11:36 AM  Result Value Ref Range   Cholesterol, Total 239 (H) 100 - 199 mg/dL   Triglycerides 358 (H) 0 - 149 mg/dL   HDL 43 >39 mg/dL   VLDL Cholesterol Cal 64 (H) 5 - 40 mg/dL   LDL Chol Calc (NIH) 132 (H) 0 - 99 mg/dL   LDL/HDL Ratio 3.1 0.0 - 3.2 ratio    Comment:                                     LDL/HDL Ratio                                             Men  Women                               1/2 Avg.Risk  1.0    1.5                                   Avg.Risk  3.6    3.2                                2X Avg.Risk  6.2    5.0                                3X Avg.Risk  8.0    6.1   TSH     Status: Abnormal   Collection Time: 09/24/19 11:36 AM  Result Value Ref Range   TSH 5.210 (H) 0.450 - 4.500 uIU/mL  T4, free     Status: None   Collection Time: 09/24/19 11:36 AM  Result Value Ref Range   Free T4 0.94 0.82 - 1.77 ng/dL  Comprehensive metabolic panel     Status: Abnormal   Collection Time: 09/24/19 11:36 AM  Result Value Ref Range   Glucose 131 (H) 65 - 99 mg/dL   BUN 14 6 - 24 mg/dL   Creatinine, Ser 0.84 0.57 - 1.00 mg/dL   GFR calc non Af Amer 85 >59 mL/min/1.73   GFR calc Af Amer 98 >59 mL/min/1.73   BUN/Creatinine Ratio 17 9 - 23   Sodium 138 134 - 144 mmol/L   Potassium 4.2 3.5 - 5.2 mmol/L   Chloride 99 96 - 106 mmol/L  CO2 22 20 - 29 mmol/L   Calcium 9.4 8.7 - 10.2 mg/dL   Total Protein 6.8 6.0 - 8.5 g/dL   Albumin 4.3 3.8 - 4.8 g/dL   Globulin, Total 2.5 1.5 - 4.5 g/dL   Albumin/Globulin Ratio 1.7 1.2 - 2.2   Bilirubin Total 0.3 0.0 - 1.2 mg/dL    Alkaline Phosphatase 84 39 - 117 IU/L   AST 24 0 - 40 IU/L   ALT 26 0 - 32 IU/L  FSH/LH     Status: None   Collection Time: 09/24/19 11:36 AM  Result Value Ref Range   LH 3.8 mIU/mL    Comment:                     Adult Female:                       Follicular phase      2.4 -  12.6                       Ovulation phase      14.0 -  95.6                       Luteal phase          1.0 -  11.4                       Postmenopausal        7.7 -  58.5    FSH 8.6 mIU/mL    Comment:                     Adult Female:                       Follicular phase      3.5 -  12.5                       Ovulation phase       4.7 -  21.5                       Luteal phase          1.7 -   7.7                       Postmenopausal       25.8 - 134.8     Radiology: Dg Foot Complete Left  Result Date: 09/26/2014 CLINICAL DATA:  Foot run over by wheelchair with pain over medial and lateral dorsum of foot. EXAM: LEFT FOOT - COMPLETE 3+ VIEW COMPARISON:  None. FINDINGS: Minimal spurring of the dorsal navicular at the talonavicular joint. No evidence of acute fracture or dislocation. IMPRESSION: Negative. Electronically Signed   By: Marin Olp M.D.   On: 09/26/2014 15:45    No results found.  No results found.    Assessment and Plan: Patient Active Problem List   Diagnosis Date Noted  . Carpal tunnel syndrome of right wrist 07/22/2019  . Dysplastic nevus syndrome 10/09/2017  . Primary osteoarthritis of left knee 04/06/2016  . Chronic bilateral low back pain with bilateral sciatica 07/15/2015  . Patellar maltracking 04/25/2015  . Reflex sympathetic dystrophy 04/13/2015  . Acquired lymphedema of lower extremity 11/10/2014  . Hyperlipidemia 11/10/2014  . Chronic venous  insufficiency 11/09/2014  . Mild intermittent asthma with acute exacerbation 10/21/2014  . Patellofemoral joint pain 10/21/2014  . Radiculopathy, lumbar region 10/12/2014  . Bilateral lower extremity edema 10/01/2014  .  Myoclonus 04/07/2014  . Pain of upper extremity 06/19/2013  . Chest pain 04/28/2013  . Encounter for therapeutic drug monitoring 08/05/2012  . Opioid dependence with physiological dependence ([place]) 08/05/2012  . Postlaminectomy syndrome, lumbar region 08/05/2012  . COPD (chronic obstructive pulmonary disease) ([place]) 06/20/2012  . Essential hypertension 06/20/2012  . Panic attack 06/20/2012  . Type 2 diabetes mellitus without complication, without long-term current use of insulin ([place]) 06/20/2012  . Hypoglycemia 08/02/2007  . Anxiety 08/02/2007  . PANIC DISORDER 08/02/2007  . ASTHMA 08/02/2007  . Menopausal symptoms 08/02/2007  . FIBROMYALGIA 08/02/2007  . Abnormal liver function tests 08/02/2007  . TOBACCO ABUSE, HX OF 08/02/2007  . Asthma 08/02/2007    1. OSA (obstructive sleep apnea) We will get CPAP titration study for evaluation of optimal therapy patient's mild OSA. - Cpap titration; Future  2. Elevated BP without diagnosis of hypertension Slightly elevated today continue to monitor.   3. Mitral valve disorder Patient has echo scheduled in 1 week.  Continue to follow PCP.  General Counseling: I have discussed the findings of the evaluation and examination with Shauntell.  I have also discussed any further diagnostic evaluation thatmay be needed or ordered today. Rukaya verbalizes understanding of the findings of todays visit. We also reviewed her medications today and discussed drug interactions and side effects including but not limited excessive drowsiness and altered mental states. We also discussed that there is always a risk not just to her but also people around her. she has been encouraged to call the office with any questions or concerns that should arise related to todays visit.    Time spent: 25 This patient was seen by Orson Gear AGNP-C in Collaboration with Dr. Devona Konig as a part of collaborative care agreement.   I have personally obtained a history,  examined the patient, evaluated laboratory and imaging results, formulated the assessment and plan and placed orders.    Allyne Gee, MD [facility] Pulmonary and Critical Care Sleep medicine

## 2019-10-24 ENCOUNTER — Ambulatory Visit: Payer: Medicare Other

## 2019-10-24 DIAGNOSIS — I340 Nonrheumatic mitral (valve) insufficiency: Secondary | ICD-10-CM | POA: Diagnosis not present

## 2019-10-24 DIAGNOSIS — I059 Rheumatic mitral valve disease, unspecified: Secondary | ICD-10-CM

## 2019-10-27 DIAGNOSIS — M9903 Segmental and somatic dysfunction of lumbar region: Secondary | ICD-10-CM | POA: Diagnosis not present

## 2019-10-27 DIAGNOSIS — M5441 Lumbago with sciatica, right side: Secondary | ICD-10-CM | POA: Diagnosis not present

## 2019-10-27 DIAGNOSIS — M9904 Segmental and somatic dysfunction of sacral region: Secondary | ICD-10-CM | POA: Diagnosis not present

## 2019-10-27 DIAGNOSIS — M5442 Lumbago with sciatica, left side: Secondary | ICD-10-CM | POA: Diagnosis not present

## 2019-10-28 ENCOUNTER — Telehealth: Payer: Self-pay

## 2019-10-28 NOTE — Telephone Encounter (Signed)
Confirmed and screened patient for 10-30-19 ov. °

## 2019-10-30 ENCOUNTER — Other Ambulatory Visit: Payer: Self-pay

## 2019-10-30 ENCOUNTER — Ambulatory Visit (INDEPENDENT_AMBULATORY_CARE_PROVIDER_SITE_OTHER): Payer: Medicare Other | Admitting: Nurse Practitioner

## 2019-10-30 ENCOUNTER — Encounter: Payer: Self-pay | Admitting: Nurse Practitioner

## 2019-10-30 VITALS — BP 110/70 | HR 105 | Temp 98.0°F | Resp 16 | Ht 61.0 in | Wt 168.2 lb

## 2019-10-30 DIAGNOSIS — I059 Rheumatic mitral valve disease, unspecified: Secondary | ICD-10-CM | POA: Diagnosis not present

## 2019-10-30 DIAGNOSIS — E1165 Type 2 diabetes mellitus with hyperglycemia: Secondary | ICD-10-CM

## 2019-10-30 DIAGNOSIS — J0141 Acute recurrent pansinusitis: Secondary | ICD-10-CM | POA: Diagnosis not present

## 2019-10-30 DIAGNOSIS — J4520 Mild intermittent asthma, uncomplicated: Secondary | ICD-10-CM

## 2019-10-30 LAB — POCT GLYCOSYLATED HEMOGLOBIN (HGB A1C): Hemoglobin A1C: 5.8 % — AB (ref 4.0–5.6)

## 2019-10-30 MED ORDER — FARXIGA 5 MG PO TABS: 5.0000 mg | ORAL_TABLET | Freq: Every day | ORAL | 3 refills | Status: DC

## 2019-10-30 MED ORDER — SULFAMETHOXAZOLE-TRIMETHOPRIM 800-160 MG PO TABS: 1.0000 | ORAL_TABLET | Freq: Two times a day (BID) | ORAL | 0 refills | Status: DC

## 2019-10-30 NOTE — Progress Notes (Signed)
[facility] [place],  [address]  Internal MEDICINE  Office Visit Note  Patient Name: [name] [name]  [id number]  [id number]  Date of Service: 11/09/2019  Chief Complaint  Patient presents with  . Follow-up    echo results  . Sinusitis    fatigue, sinus pressure in head and eyes, blowing gree mucous out nose     The patient is here for routine follow up. Was changed from metformin to farxiga 5mg . She states that she is doing well. Continues to take victoza 1.8mg  daily. She reports no negative side effects from starting [place].  She states that she is not feeling well. Has sinus pressure, congestion, headache. Denies fever. Symptoms have been present for a few days. She states that her symptoms are similar to those she has had in the past with sinus infection.  She had echocardiogram since her last visit. She had normal LVEF and trace MR and TR.       Current Medication: Outpatient Encounter Medications as of 10/30/2019  Medication Sig  . ACCU-CHEK AVIVA PLUS test strip   . albuterol (VENTOLIN HFA) 108 (90 Base) MCG/ACT inhaler Inhale into the lungs.  . Blood Glucose Monitoring Suppl (ACCU-CHEK AVIVA) device Use as instructed to test blood sugar 1 time daily. DX: E11.9  . conjugated estrogens (PREMARIN) vaginal cream Place 1 Applicatorful vaginally daily. Twice a week  . cyclobenzaprine (FLEXERIL) 10 MG tablet Take 10 mg by mouth 3 (three) times daily as needed for muscle spasms.  Marland Kitchen FLUoxetine (PROZAC) 20 MG capsule Take 1 capsule (20 mg total) by mouth daily.  . fluticasone (FLONASE) 50 MCG/ACT nasal spray Place 2 sprays into both nostrils daily.  . furosemide (LASIX) 20 MG tablet Take 20 mg by mouth. Take 1/2 tab as needed  . HYDROmorphone (DILAUDID) 4 MG tablet Take by mouth every 6 (six) hours as needed for severe pain.  . Ipratropium-Albuterol (COMBIVENT RESPIMAT) 20-100 MCG/ACT AERS respimat Inhale 1 puff into the lungs every 6 (six)  hours.  Marland Kitchen levothyroxine (SYNTHROID) 50 MCG tablet Take 1 tablet (50 mcg total) by mouth daily.  Marland Kitchen liraglutide (VICTOZA) 18 MG/3ML SOPN Inject 1.2 mg into the skin.   . meloxicam (MOBIC) 15 MG tablet Take 15 mg by mouth daily.  . metFORMIN (GLUCOPHAGE) 500 MG tablet Take 1,000 mg by mouth 2 (two) times daily with a meal.  . Methylnaltrexone Bromide (RELISTOR) 150 MG TABS Take 3 tablets by mouth.  . morphine (MS CONTIN) 60 MG 12 hr tablet Take 60 mg by mouth every 12 (twelve) hours.  . pantoprazole (PROTONIX) 40 MG tablet Take 1 tablet (40 mg total) by mouth daily.  . pravastatin (PRAVACHOL) 10 MG tablet Take 10 mg by mouth daily.  . pregabalin (LYRICA) 150 MG capsule Take 150 mg by mouth 3 (three) times daily.   . promethazine (PHENERGAN) 25 MG tablet Take 1 tablet (25 mg total) by mouth every 6 (six) hours as needed for nausea or vomiting.  . SUMAtriptan (IMITREX) 100 MG tablet Take 100 mg by mouth every 2 (two) hours as needed for migraine. May repeat in 2 hours if headache persists or recurs.  . dapagliflozin propanediol (FARXIGA) 5 MG TABS tablet Take 5 mg by mouth daily before breakfast.  . sulfamethoxazole-trimethoprim (BACTRIM DS) 800-160 MG tablet Take 1 tablet by mouth 2 (two) times daily.   No facility-administered encounter medications on file as of 10/30/2019.     Surgical History: Past Surgical History:  Procedure Laterality  Date  . ABDOMINAL HYSTERECTOMY    . CARPAL TUNNEL RELEASE    . KNEE SURGERY    . SPINAL FUSION    . SPINAL FUSION      Medical History: Past Medical History:  Diagnosis Date  . Actinic keratitis   . Allergy   . Anxiety   . Asthma   . Basal cell carcinoma   . Chronic venous insufficiency   . DDD (degenerative disc disease), lumbar   . Diabetes mellitus without complication ([place])   . Fibromyalgia   . Fibromyalgia   . Hyperlipidemia   . Lymphedema   . Melanoma ([place])   . Migraine   . Mitral valve prolapse     Family History: Family History   Problem Relation Age of Onset  . Cancer Paternal Aunt   . Cancer Paternal Uncle   . Diabetes Paternal Grandfather   . Heart disease Paternal Grandfather     Social History   Socioeconomic History  . Marital status: Divorced    Spouse name: Not on file  . Number of children: Not on file  . Years of education: Not on file  . Highest education level: Not on file  Occupational History  . Not on file  Social Needs  . Financial resource strain: Not on file  . Food insecurity    Worry: Not on file    Inability: Not on file  . Transportation needs    Medical: Not on file    Non-medical: Not on file  Tobacco Use  . Smoking status: Never Smoker  . Smokeless tobacco: Never Used  Substance and Sexual Activity  . Alcohol use: No  . Drug use: No  . Sexual activity: Not on file  Lifestyle  . Physical activity    Days per week: Not on file    Minutes per session: Not on file  . Stress: Not on file  Relationships  . Social Herbalist on phone: Not on file    Gets together: Not on file    Attends religious service: Not on file    Active member of club or organization: Not on file    Attends meetings of clubs or organizations: Not on file    Relationship status: Not on file  . Intimate partner violence    Fear of current or ex partner: Not on file    Emotionally abused: Not on file    Physically abused: Not on file    Forced sexual activity: Not on file  Other Topics Concern  . Not on file  Social History Narrative  . Not on file      Review of Systems  Constitutional: Positive for fatigue. Negative for chills, diaphoresis and fever.  HENT: Positive for congestion, postnasal drip, sinus pressure and sinus pain. Negative for ear pain.   Respiratory: Negative for cough, shortness of breath and wheezing.   Cardiovascular: Positive for palpitations. Negative for chest pain and leg swelling.  Gastrointestinal: Negative for abdominal pain, constipation, diarrhea,  nausea and vomiting.  Endocrine: Negative for cold intolerance, heat intolerance, polydipsia and polyuria.       Blood sugars doing well   Musculoskeletal: Negative for arthralgias, back pain, gait problem and neck pain.  Skin: Negative for color change.  Allergic/Immunologic: Negative for environmental allergies and food allergies.  Neurological: Negative for dizziness and headaches.  Hematological: Does not bruise/bleed easily.  Psychiatric/Behavioral: Negative for agitation, behavioral problems (depression), hallucinations and sleep disturbance.  Excessive day time sleep    Today's Vitals   10/30/19 1427  BP: 110/70  Pulse: (!) 105  Resp: 16  Temp: 98 F (36.7 C)  SpO2: 97%  Weight: 168 lb 3.2 oz (76.3 kg)  Height: 5\' 1"  (1.549 m)   Body mass index is 31.78 kg/m.  Physical Exam Vitals signs and nursing note reviewed.  Constitutional:      General: She is not in acute distress.    Appearance: Normal appearance. She is well-developed. She is ill-appearing. She is not diaphoretic.  HENT:     Head: Normocephalic and atraumatic.     Ears:     Comments: Mildly edematous ear canals. No redness or evidence of infection present.     Nose: Congestion present.     Mouth/Throat:     Pharynx: No oropharyngeal exudate.  Eyes:     Extraocular Movements: Extraocular movements intact.     Pupils: Pupils are equal, round, and reactive to light.  Neck:     Musculoskeletal: Normal range of motion and neck supple.     Thyroid: No thyromegaly.     Vascular: No JVD.     Trachea: No tracheal deviation.  Cardiovascular:     Rate and Rhythm: Normal rate and regular rhythm.     Heart sounds: Normal heart sounds. No murmur. No friction rub. No gallop.   Pulmonary:     Effort: Pulmonary effort is normal. No respiratory distress.     Breath sounds: Normal breath sounds. No wheezing or rales.  Chest:     Chest wall: No tenderness.  Abdominal:     General: Bowel sounds are normal.      Palpations: Abdomen is soft.     Tenderness: There is no abdominal tenderness.  Musculoskeletal: Normal range of motion.  Lymphadenopathy:     Cervical: No cervical adenopathy.  Skin:    General: Skin is warm and dry.  Neurological:     Mental Status: She is alert and oriented to person, place, and time.     Cranial Nerves: No cranial nerve deficit.  Psychiatric:        Behavior: Behavior normal.        Thought Content: Thought content normal.        Judgment: Judgment normal.    Assessment/Plan: 1. Type 2 diabetes mellitus with hyperglycemia, without long-term current use of insulin (HCC) - POCT HgB A1C 5.8 today. Continue all diabetic medication as prescribed.  - dapagliflozin propanediol (FARXIGA) 5 MG TABS tablet; Take 5 mg by mouth daily before breakfast.  Dispense: 30 tablet; Refill: 3  2. Acute recurrent pansinusitis Start bactrim DS bid for 10 days. Rest and increase fluids .take OTC medication as needed and as prescribed to alleviate symptoms . - sulfamethoxazole-trimethoprim (BACTRIM DS) 800-160 MG tablet; Take 1 tablet by mouth 2 (two) times daily.  Dispense: 20 tablet; Refill: 0  3. Mild intermittent asthma, unspecified whether complicated Stable. Continue inhalers as prescribed   4. Mitral valve disorder Reviewed echocardiogram results with the patient. She had normal LVEF and trace MR and TR. Will continue to monitor closely.    General Counseling: Mada verbalizes understanding of the findings of todays visit and agrees with plan of treatment. I have discussed any further diagnostic evaluation that may be needed or ordered today. We also reviewed her medications today. she has been encouraged to call the office with any questions or concerns that should arise related to todays visit.  Diabetes Counseling:  1. Addition of  ACE inh/ ARB'S for nephroprotection. Microalbumin is updated  2. Diabetic foot care, prevention of complications. Podiatry consult 3. Exercise  and lose weight.  4. Diabetic eye examination, Diabetic eye exam is updated  5. Monitor blood sugar closlely. nutrition counseling.  6. Sign and symptoms of hypoglycemia including shaking sweating,confusion and headaches.  This patient was seen by Leretha Pol FNP Collaboration with Dr Lavera Guise as a part of collaborative care agreement  Orders Placed This Encounter  Procedures  . POCT HgB A1C    Meds ordered this encounter  Medications  . sulfamethoxazole-trimethoprim (BACTRIM DS) 800-160 MG tablet    Sig: Take 1 tablet by mouth 2 (two) times daily.    Dispense:  20 tablet    Refill:  0    Order Specific Question:   Supervising Provider    Answer:   Lavera Guise [id number]  . dapagliflozin propanediol (FARXIGA) 5 MG TABS tablet    Sig: Take 5 mg by mouth daily before breakfast.    Dispense:  30 tablet    Refill:  3    Patient has manufacturer copay care.    Order Specific Question:   Supervising Provider    Answer:   Lavera Guise [id number]    Time spent: 20 Minutes      Dr Lavera Guise Internal medicine

## 2019-11-03 DIAGNOSIS — M5442 Lumbago with sciatica, left side: Secondary | ICD-10-CM | POA: Diagnosis not present

## 2019-11-03 DIAGNOSIS — M5441 Lumbago with sciatica, right side: Secondary | ICD-10-CM | POA: Diagnosis not present

## 2019-11-03 DIAGNOSIS — M9904 Segmental and somatic dysfunction of sacral region: Secondary | ICD-10-CM | POA: Diagnosis not present

## 2019-11-03 DIAGNOSIS — M9903 Segmental and somatic dysfunction of lumbar region: Secondary | ICD-10-CM | POA: Diagnosis not present

## 2019-11-09 DIAGNOSIS — I059 Rheumatic mitral valve disease, unspecified: Secondary | ICD-10-CM | POA: Insufficient documentation

## 2019-11-09 DIAGNOSIS — J0141 Acute recurrent pansinusitis: Secondary | ICD-10-CM | POA: Insufficient documentation

## 2019-11-10 ENCOUNTER — Telehealth: Payer: Self-pay

## 2019-11-10 DIAGNOSIS — M25559 Pain in unspecified hip: Secondary | ICD-10-CM | POA: Diagnosis not present

## 2019-11-10 DIAGNOSIS — Z79891 Long term (current) use of opiate analgesic: Secondary | ICD-10-CM | POA: Diagnosis not present

## 2019-11-10 DIAGNOSIS — G894 Chronic pain syndrome: Secondary | ICD-10-CM | POA: Diagnosis not present

## 2019-11-10 DIAGNOSIS — M25569 Pain in unspecified knee: Secondary | ICD-10-CM | POA: Diagnosis not present

## 2019-11-10 DIAGNOSIS — M545 Low back pain: Secondary | ICD-10-CM | POA: Diagnosis not present

## 2019-11-10 NOTE — Telephone Encounter (Signed)
Confirmed appointment with patient.

## 2019-11-12 ENCOUNTER — Other Ambulatory Visit: Payer: Self-pay

## 2019-11-12 ENCOUNTER — Encounter: Payer: Medicare Other | Admitting: Internal Medicine

## 2019-11-12 DIAGNOSIS — Z20822 Contact with and (suspected) exposure to covid-19: Secondary | ICD-10-CM

## 2019-11-15 LAB — NOVEL CORONAVIRUS, NAA: SARS-CoV-2, NAA: NOT DETECTED

## 2019-11-18 ENCOUNTER — Telehealth: Payer: Self-pay

## 2019-11-18 DIAGNOSIS — E119 Type 2 diabetes mellitus without complications: Secondary | ICD-10-CM | POA: Diagnosis not present

## 2019-11-18 NOTE — Telephone Encounter (Signed)
Confirmed pt sleep study for 11/19/19 [name] [name]

## 2019-11-19 ENCOUNTER — Other Ambulatory Visit: Payer: Self-pay

## 2019-11-19 ENCOUNTER — Ambulatory Visit: Payer: Medicare Other | Admitting: Internal Medicine

## 2019-11-19 DIAGNOSIS — G4733 Obstructive sleep apnea (adult) (pediatric): Secondary | ICD-10-CM

## 2019-11-19 MED ORDER — ACCU-CHEK SOFTCLIX LANCETS MISC: 3 refills | Status: DC

## 2019-11-20 ENCOUNTER — Other Ambulatory Visit: Payer: Self-pay

## 2019-11-20 MED ORDER — ACCU-CHEK SOFTCLIX LANCETS MISC: 3 refills | Status: DC

## 2019-11-23 NOTE — Procedures (Signed)
[facility] [address] [place], [place] [address]  Patient Name: [name] [name] DOB: [birth year]   SLEEP STUDY INTERPRETATION  DATE OF SERVICE: November 19, 2019   SLEEP STUDY HISTORY: This patient is referred to the sleep lab for a baseline Polysomnography. Pertinent history includes a history of diagnosis of excessive daytime somnolence and snoring.  PROCEDURE: This overnight polysomnogram was performed using the Alice 5 acquisition system using the standard diagnostic protocol as outlined by the AASM. This includes 6 channels of EEG, 2 channelscannels of EOG, chin EMG, bilateral anterior tibialis EMG, nasal/oral thermister, PTAF, chest and abdominal wall movements, ECG and pulse oximetry. Apneas and Hypopneas were scored per AASM definition.  SLEEP ARCHITECHTURE: This is a baseline polysomnograph  study. The total recording time was 445.3 minutes and the patients total sleep time is noted to be 309.5 minutes. Sleep onset latency was 54.0 minutes and is prolonged.  Stage R sleep onset latency was 135.5 minutes. Sleep maintenance efficiency was 69.6% and is decreased.  Sleep staging expressed as a percentage of total sleep time demonstrated 8.2% N1, 64.9% N2 and 17.6% N3  sleep. Stage R represents 9.2% of total sleep time. This is decreased.  There were a total of 19 arousals  for an overall arousal index of 3.7 per hour of sleep. PLMS arousal were not noted. Arousals without respiratory events are  noted. This can contribute to sleep architechture disruption.  RESPIRATORY MONITORING:   Patient exhibits significant evidence of sleep disorderd breathing characterized by 4 central apneas, 6 obstructive apneas and 0 mixed apneas. There were 6 obstructive hypopneas and 0 RERAs. Most of the apneas/hypopneas were of obstructive variety. The total apnea hypopnea index (apneas and hypopneas per hour of sleep) is 3.1 respiratory events per hour and is within normal  limits.  Respiratory monitoring demonstrated mild snoring through the night. There are a total of 84 snoring episodes representing 4.8% of sleep.   Baseline oxygen saturation during wakefulness was 92% and during NREM sleep averaged 91% through the night. Arterial saturation during REM sleep was 90% through the night. There was significant  oxygen desaturation with the respiratory events. Arterial oxygen desaturation occurred of at least 4% was noted with a low saturation of 78%. The study was performed off oxygen.  CARDIAC MONITORING:   Average heart rate is 99 during sleep with a high of 124 beats per minute. Malignant arrhythmias were not noted.  CPAP titration: Patient was started on CPAP titration according to the sleep lab protocol.  The patient was started on a CPAP of 5 CWP and increased to a maximal pressure of 8 CWP.  Patient appeared to achieve adequate control of the respiratory events however still had some desaturations noted on the maximal pressure.  On a pressure of 8 CWP patient slept for 115.5 minutes and did exhibit stage R sleep.  The total AHI was 0.5/h and lowest oxygen saturation was 88%.  It would appear that the patient may need a pressure higher than 8 CWP and therefore a auto titrating device may be in order.  Along with this and overnight oximetry should be checked on the CPAP  IMPRESSIONS:  --This overnight polysomnogram demonstrates insignificant obstructive sleep apnea with an overall AHI 3.1 per hour. --The overall AHI was no worse  during Stage R. --There were associated significant arterial oxygen desaturations noted with a lowest saturation of 78% --There was no significant PLMS noted in this study. --There is mild snoring noted throughout the study.  RECOMMENDATIONS:  --CPAP titration study is adequate to control the patient's respiratory events however there is still ongoing oxygen desaturation and further titration should be considered with an auto  titrating device and a overnight pulse oximetry while on the device.. --Nasal decongestants and antihistamines may be of help for increased upper airways resistance when present. --Weight loss through dietary and lifestyle modification is recommended in the presence of obesity. --A search for and treatment of any underlying cardiopulmonary disease is      recommended in the presence of oxygen desaturations. --Alternative treatment options if the patient is not willing to use CPAP include oral   appliances as well as surgical intervention which may help in the appropriate patient. --Clinical correlation is recommended. Please feel free to call the office for any further  questions or assistance in the care of this patient.     Allyne Gee, MD [facility] Critical Care Medicine Sleep medicine

## 2019-11-24 DIAGNOSIS — M5442 Lumbago with sciatica, left side: Secondary | ICD-10-CM | POA: Diagnosis not present

## 2019-11-24 DIAGNOSIS — M5441 Lumbago with sciatica, right side: Secondary | ICD-10-CM | POA: Diagnosis not present

## 2019-11-24 DIAGNOSIS — M9903 Segmental and somatic dysfunction of lumbar region: Secondary | ICD-10-CM | POA: Diagnosis not present

## 2019-11-24 DIAGNOSIS — M9904 Segmental and somatic dysfunction of sacral region: Secondary | ICD-10-CM | POA: Diagnosis not present

## 2019-11-25 ENCOUNTER — Ambulatory Visit: Payer: Medicare Other | Admitting: Internal Medicine

## 2019-11-25 ENCOUNTER — Other Ambulatory Visit: Payer: Self-pay | Admitting: Adult Health

## 2019-12-01 ENCOUNTER — Ambulatory Visit: Payer: Medicare Other | Admitting: Internal Medicine

## 2019-12-01 ENCOUNTER — Telehealth: Payer: Self-pay

## 2019-12-01 NOTE — Telephone Encounter (Signed)
Confirmed appointment with patient. klh °

## 2019-12-03 ENCOUNTER — Other Ambulatory Visit: Payer: Self-pay

## 2019-12-03 ENCOUNTER — Telehealth: Payer: Self-pay

## 2019-12-03 ENCOUNTER — Encounter: Payer: Self-pay | Admitting: Internal Medicine

## 2019-12-03 ENCOUNTER — Ambulatory Visit: Payer: Medicare Other | Admitting: Internal Medicine

## 2019-12-03 VITALS — BP 116/76 | HR 104 | Temp 98.0°F | Resp 16 | Ht 61.0 in | Wt 170.0 lb

## 2019-12-03 DIAGNOSIS — J4520 Mild intermittent asthma, uncomplicated: Secondary | ICD-10-CM

## 2019-12-03 DIAGNOSIS — G4733 Obstructive sleep apnea (adult) (pediatric): Secondary | ICD-10-CM | POA: Diagnosis not present

## 2019-12-03 NOTE — Telephone Encounter (Signed)
Gave [facility] new orders for new cpap titration RX for new cpap set up. [name] [name]

## 2019-12-03 NOTE — Progress Notes (Signed)
[facility] [place], [place] [address]  Pulmonary Sleep Medicine   Office Visit Note  Patient Name: [name] [name] DOB: 1976-08-12 MRN [id number]  Date of Service: 12/03/2019  Complaints/HPI: Pt is here for follow up on cpap titration study. Her study shows "Adequate to control the patients respiratory events, however there is till ongoing oxygen desaturation and further titration should be considered with an auto titration device and overnight pulse oximetry while on the device. She continues to reports difficulty sleeping, and being very tired and fatigued during the day.   ROS  General: (-) fever, (-) chills, (-) night sweats, (-) weakness Skin: (-) rashes, (-) itching,. Eyes: (-) visual changes, (-) redness, (-) itching. Nose and Sinuses: (-) nasal stuffiness or itchiness, (-) postnasal drip, (-) nosebleeds, (-) sinus trouble. Mouth and Throat: (-) sore throat, (-) hoarseness. Neck: (-) swollen glands, (-) enlarged thyroid, (-) neck pain. Respiratory: - cough, (-) bloody sputum, - shortness of breath, - wheezing. Cardiovascular: - ankle swelling, (-) chest pain. Lymphatic: (-) lymph node enlargement. Neurologic: (-) numbness, (-) tingling. Psychiatric: (-) anxiety, (-) depression   Current Medication: Outpatient Encounter Medications as of 12/03/2019  Medication Sig  . ACCU-CHEK AVIVA PLUS test strip   . Accu-Chek Softclix Lancets lancets Use as instructed. DX E11.65  . albuterol (VENTOLIN HFA) 108 (90 Base) MCG/ACT inhaler Inhale into the lungs.  . Blood Glucose Monitoring Suppl (ACCU-CHEK AVIVA) device Use as instructed to test blood sugar 1 time daily. DX: E11.9  . conjugated estrogens (PREMARIN) vaginal cream Place 1 Applicatorful vaginally daily. Twice a week  . cyclobenzaprine (FLEXERIL) 10 MG tablet Take 10 mg by mouth 3 (three) times daily as needed for muscle spasms.  . dapagliflozin propanediol (FARXIGA) 5 MG TABS tablet Take 5 mg  by mouth daily before breakfast.  . FLUoxetine (PROZAC) 20 MG capsule Take 1 capsule (20 mg total) by mouth daily.  . fluticasone (FLONASE) 50 MCG/ACT nasal spray Place 2 sprays into both nostrils daily.  . furosemide (LASIX) 20 MG tablet Take 20 mg by mouth. Take 1/2 tab as needed  . HYDROmorphone (DILAUDID) 4 MG tablet Take by mouth every 6 (six) hours as needed for severe pain.  . Ipratropium-Albuterol (COMBIVENT RESPIMAT) 20-100 MCG/ACT AERS respimat Inhale 1 puff into the lungs every 6 (six) hours.  Marland Kitchen levothyroxine (SYNTHROID) 50 MCG tablet Take 1 tablet (50 mcg total) by mouth daily.  Marland Kitchen liraglutide (VICTOZA) 18 MG/3ML SOPN Inject 1.2 mg into the skin.   . meloxicam (MOBIC) 15 MG tablet Take 15 mg by mouth daily.  . metFORMIN (GLUCOPHAGE) 500 MG tablet Take 1,000 mg by mouth 2 (two) times daily with a meal.  . Methylnaltrexone Bromide (RELISTOR) 150 MG TABS Take 3 tablets by mouth.  . morphine (MS CONTIN) 60 MG 12 hr tablet Take 60 mg by mouth every 12 (twelve) hours.  . pantoprazole (PROTONIX) 40 MG tablet TAKE 1 TABLET BY MOUTH EVERY DAY  . pravastatin (PRAVACHOL) 10 MG tablet Take 10 mg by mouth daily.  . pregabalin (LYRICA) 150 MG capsule Take 150 mg by mouth 3 (three) times daily.   . promethazine (PHENERGAN) 25 MG tablet Take 1 tablet (25 mg total) by mouth every 6 (six) hours as needed for nausea or vomiting.  . sulfamethoxazole-trimethoprim (BACTRIM DS) 800-160 MG tablet Take 1 tablet by mouth 2 (two) times daily.  . SUMAtriptan (IMITREX) 100 MG tablet Take 100 mg by mouth every 2 (two) hours as needed for migraine. May  repeat in 2 hours if headache persists or recurs.   No facility-administered encounter medications on file as of 12/03/2019.    Surgical History: Past Surgical History:  Procedure Laterality Date  . ABDOMINAL HYSTERECTOMY    . CARPAL TUNNEL RELEASE    . KNEE SURGERY    . SPINAL FUSION    . SPINAL FUSION      Medical History: Past Medical History:   Diagnosis Date  . Actinic keratitis   . Allergy   . Anxiety   . Asthma   . Basal cell carcinoma   . Chronic venous insufficiency   . DDD (degenerative disc disease), lumbar   . Diabetes mellitus without complication ([place])   . Fibromyalgia   . Fibromyalgia   . Hyperlipidemia   . Lymphedema   . Melanoma ([place])   . Migraine   . Mitral valve prolapse     Family History: Family History  Problem Relation Age of Onset  . Cancer Paternal Aunt   . Cancer Paternal Uncle   . Diabetes Paternal Grandfather   . Heart disease Paternal Grandfather     Social History: Social History   Socioeconomic History  . Marital status: Divorced    Spouse name: Not on file  . Number of children: Not on file  . Years of education: Not on file  . Highest education level: Not on file  Occupational History  . Not on file  Tobacco Use  . Smoking status: Never Smoker  . Smokeless tobacco: Never Used  Substance and Sexual Activity  . Alcohol use: No  . Drug use: No  . Sexual activity: Not on file  Other Topics Concern  . Not on file  Social History Narrative  . Not on file   Social Determinants of Health   Financial Resource Strain:   . Difficulty of Paying Living Expenses: Not on file  Food Insecurity:   . Worried About Charity fundraiser in the Last Year: Not on file  . Ran Out of Food in the Last Year: Not on file  Transportation Needs:   . Lack of Transportation (Medical): Not on file  . Lack of Transportation (Non-Medical): Not on file  Physical Activity:   . Days of Exercise per Week: Not on file  . Minutes of Exercise per Session: Not on file  Stress:   . Feeling of Stress : Not on file  Social Connections:   . Frequency of Communication with Friends and Family: Not on file  . Frequency of Social Gatherings with Friends and Family: Not on file  . Attends Religious Services: Not on file  . Active Member of Clubs or Organizations: Not on file  . Attends Archivist  Meetings: Not on file  . Marital Status: Not on file  Intimate Partner Violence:   . Fear of Current or Ex-Partner: Not on file  . Emotionally Abused: Not on file  . Physically Abused: Not on file  . Sexually Abused: Not on file    Vital Signs: Blood pressure 116/76, pulse (!) 104, temperature 98 F (36.7 C), resp. rate 16, height 5\' 1"  (1.549 m), weight 170 lb (77.1 kg), last menstrual period 12/13/1991, SpO2 97 %.  Examination: General Appearance: The patient is well-developed, well-nourished, and in no distress. Skin: Gross inspection of skin unremarkable. Head: normocephalic, no gross deformities. Eyes: no gross deformities noted. ENT: ears appear grossly normal no exudates. Neck: Supple. No thyromegaly. No LAD. Respiratory: clear bilateraly. Cardiovascular: Normal S1 and S2  without murmur or rub. Extremities: No cyanosis. pulses are equal. Neurologic: Alert and oriented. No involuntary movements.  LABS: Recent Results (from the past 2160 hour(s))  CBC with Differential/Platelet     Status: None   Collection Time: 09/24/19 11:36 AM  Result Value Ref Range   WBC 5.0 3.4 - 10.8 x10E3/uL   RBC 4.61 3.77 - 5.28 x10E6/uL   Hemoglobin 13.7 11.1 - 15.9 g/dL   Hematocrit 40.4 34.0 - 46.6 %   MCV 88 79 - 97 fL   MCH 29.7 26.6 - 33.0 pg   MCHC 33.9 31.5 - 35.7 g/dL   RDW 12.6 11.7 - 15.4 %   Platelets 321 150 - 450 x10E3/uL   Neutrophils 50 Not Estab. %   Lymphs 36 Not Estab. %   Monocytes 7 Not Estab. %   Eos 6 Not Estab. %   Basos 1 Not Estab. %   Neutrophils Absolute 2.5 1.4 - 7.0 x10E3/uL   Lymphocytes Absolute 1.8 0.7 - 3.1 x10E3/uL   Monocytes Absolute 0.4 0.1 - 0.9 x10E3/uL   EOS (ABSOLUTE) 0.3 0.0 - 0.4 x10E3/uL   Basophils Absolute 0.1 0.0 - 0.2 x10E3/uL   Immature Granulocytes 0 Not Estab. %   Immature Grans (Abs) 0.0 0.0 - 0.1 x10E3/uL  Lipid Panel With LDL/HDL Ratio     Status: Abnormal   Collection Time: 09/24/19 11:36 AM  Result Value Ref Range    Cholesterol, Total 239 (H) 100 - 199 mg/dL   Triglycerides 358 (H) 0 - 149 mg/dL   HDL 43 >39 mg/dL   VLDL Cholesterol Cal 64 (H) 5 - 40 mg/dL   LDL Chol Calc (NIH) 132 (H) 0 - 99 mg/dL   LDL/HDL Ratio 3.1 0.0 - 3.2 ratio    Comment:                                     LDL/HDL Ratio                                             Men  Women                               1/2 Avg.Risk  1.0    1.5                                   Avg.Risk  3.6    3.2                                2X Avg.Risk  6.2    5.0                                3X Avg.Risk  8.0    6.1   TSH     Status: Abnormal   Collection Time: 09/24/19 11:36 AM  Result Value Ref Range   TSH 5.210 (H) 0.450 - 4.500 uIU/mL  T4, free     Status: None   Collection Time: 09/24/19 11:36 AM  Result Value Ref Range   Free  T4 0.94 0.82 - 1.77 ng/dL  Comprehensive metabolic panel     Status: Abnormal   Collection Time: 09/24/19 11:36 AM  Result Value Ref Range   Glucose 131 (H) 65 - 99 mg/dL   BUN 14 6 - 24 mg/dL   Creatinine, Ser 0.84 0.57 - 1.00 mg/dL   GFR calc non Af Amer 85 >59 mL/min/1.73   GFR calc Af Amer 98 >59 mL/min/1.73   BUN/Creatinine Ratio 17 9 - 23   Sodium 138 134 - 144 mmol/L   Potassium 4.2 3.5 - 5.2 mmol/L   Chloride 99 96 - 106 mmol/L   CO2 22 20 - 29 mmol/L   Calcium 9.4 8.7 - 10.2 mg/dL   Total Protein 6.8 6.0 - 8.5 g/dL   Albumin 4.3 3.8 - 4.8 g/dL   Globulin, Total 2.5 1.5 - 4.5 g/dL   Albumin/Globulin Ratio 1.7 1.2 - 2.2   Bilirubin Total 0.3 0.0 - 1.2 mg/dL   Alkaline Phosphatase 84 39 - 117 IU/L   AST 24 0 - 40 IU/L   ALT 26 0 - 32 IU/L  FSH/LH     Status: None   Collection Time: 09/24/19 11:36 AM  Result Value Ref Range   LH 3.8 mIU/mL    Comment:                     Adult Female:                       Follicular phase      2.4 -  12.6                       Ovulation phase      14.0 -  95.6                       Luteal phase          1.0 -  11.4                       Postmenopausal        7.7  -  58.5    FSH 8.6 mIU/mL    Comment:                     Adult Female:                       Follicular phase      3.5 -  12.5                       Ovulation phase       4.7 -  21.5                       Luteal phase          1.7 -   7.7                       Postmenopausal       25.8 - 134.8   POCT HgB A1C     Status: Abnormal   Collection Time: 10/30/19  3:03 PM  Result Value Ref Range   Hemoglobin A1C 5.8 (A) 4.0 - 5.6 %   HbA1c POC (<> result, manual entry)     HbA1c, POC (prediabetic range)  HbA1c, POC (controlled diabetic range)    Novel Coronavirus, NAA ([facility])     Status: None   Collection Time: 11/12/19  3:04 PM   Specimen: Nasopharyngeal(NP) swabs in vial transport medium   NASOPHARYNGE  TESTING  Result Value Ref Range   SARS-CoV-2, NAA Not Detected Not Detected    Comment: This nucleic acid amplification test was developed and its performance characteristics determined by [employer]. Nucleic acid amplification tests include PCR and TMA. This test has not been FDA cleared or approved. This test has been authorized by FDA under an Emergency Use Authorization (EUA). This test is only authorized for the duration of time the declaration that circumstances exist justifying the authorization of the emergency use of in vitro diagnostic tests for detection of SARS-CoV-2 virus and/or diagnosis of COVID-19 infection under section 564(b)(1) of the Act, 21 U.S.C. PT:[id number]) (1), unless the authorization is terminated or revoked sooner. When diagnostic testing is negative, the possibility of a false negative result should be considered in the context of a patient's recent exposures and the presence of clinical signs and symptoms consistent with COVID-19. An individual without symptoms of COVID-19 and who is not shedding SARS-CoV-2 virus would  expect to have a negative (not detected) result in this assay.     Radiology: DG Foot Complete Left  Result Date:  09/26/2014 CLINICAL DATA:  Foot run over by wheelchair with pain over medial and lateral dorsum of foot. EXAM: LEFT FOOT - COMPLETE 3+ VIEW COMPARISON:  None. FINDINGS: Minimal spurring of the dorsal navicular at the talonavicular joint. No evidence of acute fracture or dislocation. IMPRESSION: Negative. Electronically Signed   By: Marin Olp M.D.   On: 09/26/2014 15:45    No results found.  No results found.    Assessment and Plan: Patient Active Problem List   Diagnosis Date Noted  . Acute recurrent pansinusitis 11/09/2019  . Mitral valve disorder 11/09/2019  . Carpal tunnel syndrome of right wrist 07/22/2019  . Dysplastic nevus syndrome 10/09/2017  . Primary osteoarthritis of left knee 04/06/2016  . Chronic bilateral low back pain with bilateral sciatica 07/15/2015  . Patellar maltracking 04/25/2015  . Reflex sympathetic dystrophy 04/13/2015  . Acquired lymphedema of lower extremity 11/10/2014  . Hyperlipidemia 11/10/2014  . Chronic venous insufficiency 11/09/2014  . Mild intermittent asthma 10/21/2014  . Patellofemoral joint pain 10/21/2014  . Radiculopathy, lumbar region 10/12/2014  . Bilateral lower extremity edema 10/01/2014  . Myoclonus 04/07/2014  . Pain of upper extremity 06/19/2013  . Chest pain 04/28/2013  . Encounter for therapeutic drug monitoring 08/05/2012  . Opioid dependence with physiological dependence ([place]) 08/05/2012  . Postlaminectomy syndrome, lumbar region 08/05/2012  . COPD (chronic obstructive pulmonary disease) ([place]) 06/20/2012  . Essential hypertension 06/20/2012  . Panic attack 06/20/2012  . Type 2 diabetes mellitus with hyperglycemia, without long-term current use of insulin ([place]) 06/20/2012  . Hypoglycemia 08/02/2007  . Anxiety 08/02/2007  . PANIC DISORDER 08/02/2007  . ASTHMA 08/02/2007  . Menopausal symptoms 08/02/2007  . FIBROMYALGIA 08/02/2007  . Abnormal liver function tests 08/02/2007  . TOBACCO ABUSE, HX OF 08/02/2007  . Asthma  08/02/2007   1. OSA (obstructive sleep apnea) Will get auto-titration machine for patient.  - For home use only DME continuous positive airway pressure (CPAP)  2. Mild intermittent asthma, unspecified whether complicated Stable, continue present management at this time.    General Counseling: I have discussed the findings of the evaluation and examination with [name] [name].  I have also discussed any  further diagnostic evaluation thatmay be needed or ordered today. [name] [name] verbalizes understanding of the findings of todays visit. We also reviewed her medications today and discussed drug interactions and side effects including but not limited excessive drowsiness and altered mental states. We also discussed that there is always a risk not just to her but also people around her. she has been encouraged to call the office with any questions or concerns that should arise related to todays visit.  Orders Placed This Encounter  Procedures  . For home use only DME continuous positive airway pressure (CPAP)    Order Specific Question:   Length of Need    Answer:   Lifetime    Order Specific Question:   Patient has OSA or probable OSA    Answer:   Yes    Order Specific Question:   Settings    Answer:   Autotitration    Order Specific Question:   CPAP supplies needed    Answer:   Mask, headgear, cushions, filters, heated tubing and water chamber     Time spent: 15 This patient was seen by Orson Gear AGNP-C in Collaboration with Dr. Devona Konig as a part of collaborative care agreement.   I have personally obtained a history, examined the patient, evaluated laboratory and imaging results, formulated the assessment and plan and placed orders.    Allyne Gee, MD [facility] Pulmonary and Critical Care Sleep medicine

## 2019-12-08 DIAGNOSIS — M5442 Lumbago with sciatica, left side: Secondary | ICD-10-CM | POA: Diagnosis not present

## 2019-12-08 DIAGNOSIS — M25569 Pain in unspecified knee: Secondary | ICD-10-CM | POA: Diagnosis not present

## 2019-12-08 DIAGNOSIS — M5441 Lumbago with sciatica, right side: Secondary | ICD-10-CM | POA: Diagnosis not present

## 2019-12-08 DIAGNOSIS — M9904 Segmental and somatic dysfunction of sacral region: Secondary | ICD-10-CM | POA: Diagnosis not present

## 2019-12-08 DIAGNOSIS — M9903 Segmental and somatic dysfunction of lumbar region: Secondary | ICD-10-CM | POA: Diagnosis not present

## 2019-12-08 DIAGNOSIS — M545 Low back pain: Secondary | ICD-10-CM | POA: Diagnosis not present

## 2019-12-08 DIAGNOSIS — Z79891 Long term (current) use of opiate analgesic: Secondary | ICD-10-CM | POA: Diagnosis not present

## 2019-12-08 DIAGNOSIS — G894 Chronic pain syndrome: Secondary | ICD-10-CM | POA: Diagnosis not present

## 2019-12-08 DIAGNOSIS — M25559 Pain in unspecified hip: Secondary | ICD-10-CM | POA: Diagnosis not present

## 2019-12-15 ENCOUNTER — Telehealth: Payer: Self-pay

## 2019-12-15 NOTE — Telephone Encounter (Signed)
Confirmed appointment with patient. klh °

## 2019-12-17 ENCOUNTER — Ambulatory Visit (INDEPENDENT_AMBULATORY_CARE_PROVIDER_SITE_OTHER): Payer: Medicare Other

## 2019-12-17 ENCOUNTER — Other Ambulatory Visit: Payer: Self-pay

## 2019-12-17 DIAGNOSIS — G4733 Obstructive sleep apnea (adult) (pediatric): Secondary | ICD-10-CM

## 2019-12-17 NOTE — Progress Notes (Signed)
New CPAP setup  She was setup on resmed auto cpap S-10 at 8 cmH2o with climate line, heated humidifier, and a resmed N-20 nasal mask small. She had a good understanding of using cleaning and compliance of the cpap. Will follow up in clinic in 4 weeks and with Dr Humphrey Rolls in 6-8 weeks

## 2019-12-22 DIAGNOSIS — M9903 Segmental and somatic dysfunction of lumbar region: Secondary | ICD-10-CM | POA: Diagnosis not present

## 2019-12-22 DIAGNOSIS — M5442 Lumbago with sciatica, left side: Secondary | ICD-10-CM | POA: Diagnosis not present

## 2019-12-22 DIAGNOSIS — M5441 Lumbago with sciatica, right side: Secondary | ICD-10-CM | POA: Diagnosis not present

## 2019-12-22 DIAGNOSIS — M9904 Segmental and somatic dysfunction of sacral region: Secondary | ICD-10-CM | POA: Diagnosis not present

## 2019-12-23 ENCOUNTER — Ambulatory Visit: Payer: Medicare Other | Admitting: Internal Medicine

## 2020-01-05 DIAGNOSIS — M25559 Pain in unspecified hip: Secondary | ICD-10-CM | POA: Diagnosis not present

## 2020-01-05 DIAGNOSIS — G894 Chronic pain syndrome: Secondary | ICD-10-CM | POA: Diagnosis not present

## 2020-01-05 DIAGNOSIS — M545 Low back pain: Secondary | ICD-10-CM | POA: Diagnosis not present

## 2020-01-05 DIAGNOSIS — Z79891 Long term (current) use of opiate analgesic: Secondary | ICD-10-CM | POA: Diagnosis not present

## 2020-01-05 DIAGNOSIS — M25569 Pain in unspecified knee: Secondary | ICD-10-CM | POA: Diagnosis not present

## 2020-01-12 DIAGNOSIS — M9903 Segmental and somatic dysfunction of lumbar region: Secondary | ICD-10-CM | POA: Diagnosis not present

## 2020-01-12 DIAGNOSIS — M5442 Lumbago with sciatica, left side: Secondary | ICD-10-CM | POA: Diagnosis not present

## 2020-01-12 DIAGNOSIS — M5441 Lumbago with sciatica, right side: Secondary | ICD-10-CM | POA: Diagnosis not present

## 2020-01-12 DIAGNOSIS — M9904 Segmental and somatic dysfunction of sacral region: Secondary | ICD-10-CM | POA: Diagnosis not present

## 2020-01-14 ENCOUNTER — Ambulatory Visit: Payer: Medicare Other

## 2020-01-17 DIAGNOSIS — G4733 Obstructive sleep apnea (adult) (pediatric): Secondary | ICD-10-CM | POA: Diagnosis not present

## 2020-01-19 DIAGNOSIS — M5442 Lumbago with sciatica, left side: Secondary | ICD-10-CM | POA: Diagnosis not present

## 2020-01-19 DIAGNOSIS — M5441 Lumbago with sciatica, right side: Secondary | ICD-10-CM | POA: Diagnosis not present

## 2020-01-19 DIAGNOSIS — M9904 Segmental and somatic dysfunction of sacral region: Secondary | ICD-10-CM | POA: Diagnosis not present

## 2020-01-19 DIAGNOSIS — M9903 Segmental and somatic dysfunction of lumbar region: Secondary | ICD-10-CM | POA: Diagnosis not present

## 2020-01-20 ENCOUNTER — Telehealth: Payer: Self-pay

## 2020-01-20 NOTE — Telephone Encounter (Signed)
Confirmed and Screened patient in office for cpap.

## 2020-01-21 ENCOUNTER — Other Ambulatory Visit: Payer: Self-pay

## 2020-01-21 ENCOUNTER — Ambulatory Visit: Payer: Medicare Other

## 2020-01-21 DIAGNOSIS — G4733 Obstructive sleep apnea (adult) (pediatric): Secondary | ICD-10-CM | POA: Diagnosis not present

## 2020-01-21 NOTE — Progress Notes (Signed)
95 percentile pressure 8   95th percentile leak 12.0   apnea index 1.5 /hr  apnea-hypopnea index  1.6 /hr   total days used  >4 hr 27 days  total days used <4 hr 3 days  Total compliance 90 percent  She is doing great states feeling better and can tell it is helping. She is trying a different mask will let me know which one she likes.

## 2020-01-27 ENCOUNTER — Telehealth: Payer: Self-pay

## 2020-01-27 NOTE — Telephone Encounter (Signed)
CONFIRMATION OF VERBAL ORDER SIGNED AND PLACED IN [employer]OLDER. °

## 2020-01-27 NOTE — Telephone Encounter (Signed)
Confirmed virtual visit on 01/29/2020. klh

## 2020-01-28 DIAGNOSIS — M9903 Segmental and somatic dysfunction of lumbar region: Secondary | ICD-10-CM | POA: Diagnosis not present

## 2020-01-28 DIAGNOSIS — M9904 Segmental and somatic dysfunction of sacral region: Secondary | ICD-10-CM | POA: Diagnosis not present

## 2020-01-28 DIAGNOSIS — M5441 Lumbago with sciatica, right side: Secondary | ICD-10-CM | POA: Diagnosis not present

## 2020-01-28 DIAGNOSIS — M5442 Lumbago with sciatica, left side: Secondary | ICD-10-CM | POA: Diagnosis not present

## 2020-01-29 ENCOUNTER — Ambulatory Visit: Payer: Medicare Other | Admitting: Adult Health

## 2020-01-29 ENCOUNTER — Other Ambulatory Visit: Payer: Self-pay

## 2020-01-29 ENCOUNTER — Encounter: Payer: Self-pay | Admitting: Adult Health

## 2020-02-02 ENCOUNTER — Other Ambulatory Visit: Payer: Self-pay

## 2020-02-02 ENCOUNTER — Emergency Department
Admission: EM | Admit: 2020-02-02 | Discharge: 2020-02-03 | Disposition: A | Discharge status: Home / Self Care | Payer: Medicare Other | Attending: Emergency Medicine | Admitting: Emergency Medicine

## 2020-02-02 ENCOUNTER — Encounter: Payer: Self-pay | Admitting: Emergency Medicine

## 2020-02-02 DIAGNOSIS — Z5321 Procedure and treatment not carried out due to patient leaving prior to being seen by health care provider: Secondary | ICD-10-CM | POA: Diagnosis not present

## 2020-02-02 DIAGNOSIS — I10 Essential (primary) hypertension: Secondary | ICD-10-CM | POA: Diagnosis not present

## 2020-02-02 DIAGNOSIS — Z79891 Long term (current) use of opiate analgesic: Secondary | ICD-10-CM | POA: Diagnosis not present

## 2020-02-02 DIAGNOSIS — M25559 Pain in unspecified hip: Secondary | ICD-10-CM | POA: Diagnosis not present

## 2020-02-02 DIAGNOSIS — M545 Low back pain: Secondary | ICD-10-CM | POA: Diagnosis not present

## 2020-02-02 DIAGNOSIS — G894 Chronic pain syndrome: Secondary | ICD-10-CM | POA: Diagnosis not present

## 2020-02-02 DIAGNOSIS — M25569 Pain in unspecified knee: Secondary | ICD-10-CM | POA: Diagnosis not present

## 2020-02-02 LAB — CBC
HCT: 40.4 % (ref 36.0–46.0)
Hemoglobin: 13.7 g/dL (ref 12.0–15.0)
MCH: 29.2 pg (ref 26.0–34.0)
MCHC: 33.9 g/dL (ref 30.0–36.0)
MCV: 86.1 fL (ref 80.0–100.0)
Platelets: 325 10*3/uL (ref 150–400)
RBC: 4.69 MIL/uL (ref 3.87–5.11)
RDW: 12.2 % (ref 11.5–15.5)
WBC: 8.3 10*3/uL (ref 4.0–10.5)
nRBC: 0 % (ref 0.0–0.2)

## 2020-02-02 LAB — TROPONIN I (HIGH SENSITIVITY): Troponin I (High Sensitivity): <2 ng/L (ref <18)

## 2020-02-02 LAB — BASIC METABOLIC PANEL
Anion gap: 8 (ref 5–15)
BUN: 15 mg/dL (ref 6–20)
CO2: 27 mmol/L (ref 22–32)
Calcium: 8.7 mg/dL — ABNORMAL LOW (ref 8.9–10.3)
Chloride: 100 mmol/L (ref 98–111)
Creatinine, Ser: 0.82 mg/dL (ref 0.44–1.00)
GFR calc Af Amer: >60 mL/min (ref >60)
GFR calc non Af Amer: >60 mL/min (ref >60)
Glucose, Bld: 143 mg/dL — ABNORMAL HIGH (ref 70–99)
Potassium: 3.5 mmol/L (ref 3.5–5.1)
Sodium: 135 mmol/L (ref 135–145)

## 2020-02-02 MED ORDER — SODIUM CHLORIDE 0.9% FLUSH: 3.0000 mL | Freq: Once | INTRAVENOUS | Status: DC

## 2020-02-02 NOTE — ED Notes (Signed)
No answer when called several times from lobby 

## 2020-02-02 NOTE — ED Triage Notes (Signed)
Pt to ED from home c/o hypertension and dizziness that started yesterday.  States BP at home 156/109.  States some dizziness at this time, denies any new pain.  Pt A&Ox4, speaking in complete and coherent sentences, chest rise even and unlabored, in NAD at this time.

## 2020-02-03 ENCOUNTER — Telehealth: Payer: Self-pay | Admitting: Emergency Medicine

## 2020-02-03 NOTE — ED Notes (Signed)
No answer when called several times from lobby 

## 2020-02-03 NOTE — Telephone Encounter (Signed)
Called patient due to lwot to inquire about condition and follow up plans. She says her blood pressure was okay, so she didn't stay.  I encouraged her to call her pcp and let them know that she had the dizziness and they can look at the results of what was done here.  I told her the dizziness could have been caused by something other than blood pressure.

## 2020-02-05 ENCOUNTER — Ambulatory Visit: Payer: Medicare Other | Admitting: Adult Health

## 2020-02-09 DIAGNOSIS — M5442 Lumbago with sciatica, left side: Secondary | ICD-10-CM | POA: Diagnosis not present

## 2020-02-09 DIAGNOSIS — M9904 Segmental and somatic dysfunction of sacral region: Secondary | ICD-10-CM | POA: Diagnosis not present

## 2020-02-09 DIAGNOSIS — M5441 Lumbago with sciatica, right side: Secondary | ICD-10-CM | POA: Diagnosis not present

## 2020-02-09 DIAGNOSIS — M9903 Segmental and somatic dysfunction of lumbar region: Secondary | ICD-10-CM | POA: Diagnosis not present

## 2020-02-11 ENCOUNTER — Other Ambulatory Visit: Payer: Self-pay | Admitting: Adult Health

## 2020-02-11 DIAGNOSIS — F339 Major depressive disorder, recurrent, unspecified: Secondary | ICD-10-CM

## 2020-02-14 DIAGNOSIS — G4733 Obstructive sleep apnea (adult) (pediatric): Secondary | ICD-10-CM | POA: Diagnosis not present

## 2020-02-16 DIAGNOSIS — M9904 Segmental and somatic dysfunction of sacral region: Secondary | ICD-10-CM | POA: Diagnosis not present

## 2020-02-16 DIAGNOSIS — M5441 Lumbago with sciatica, right side: Secondary | ICD-10-CM | POA: Diagnosis not present

## 2020-02-16 DIAGNOSIS — M5442 Lumbago with sciatica, left side: Secondary | ICD-10-CM | POA: Diagnosis not present

## 2020-02-16 DIAGNOSIS — M9903 Segmental and somatic dysfunction of lumbar region: Secondary | ICD-10-CM | POA: Diagnosis not present

## 2020-02-20 ENCOUNTER — Telehealth: Payer: Self-pay

## 2020-02-20 NOTE — Telephone Encounter (Signed)
Confirmed appointment on 02/24/2020 and screened for covid. klh 

## 2020-02-24 ENCOUNTER — Encounter: Payer: Self-pay | Admitting: Adult Health

## 2020-02-24 ENCOUNTER — Ambulatory Visit (INDEPENDENT_AMBULATORY_CARE_PROVIDER_SITE_OTHER): Payer: Medicare Other | Admitting: Adult Health

## 2020-02-24 ENCOUNTER — Other Ambulatory Visit: Payer: Self-pay

## 2020-02-24 ENCOUNTER — Telehealth: Payer: Self-pay

## 2020-02-24 VITALS — BP 124/76 | HR 93 | Temp 97.6°F | Resp 16 | Ht 61.0 in | Wt 168.8 lb

## 2020-02-24 DIAGNOSIS — G4733 Obstructive sleep apnea (adult) (pediatric): Secondary | ICD-10-CM | POA: Diagnosis not present

## 2020-02-24 DIAGNOSIS — E039 Hypothyroidism, unspecified: Secondary | ICD-10-CM | POA: Diagnosis not present

## 2020-02-24 DIAGNOSIS — E1165 Type 2 diabetes mellitus with hyperglycemia: Secondary | ICD-10-CM | POA: Diagnosis not present

## 2020-02-24 DIAGNOSIS — J4520 Mild intermittent asthma, uncomplicated: Secondary | ICD-10-CM

## 2020-02-24 DIAGNOSIS — Z6831 Body mass index (BMI) 31.0-31.9, adult: Secondary | ICD-10-CM

## 2020-02-24 DIAGNOSIS — Z9989 Dependence on other enabling machines and devices: Secondary | ICD-10-CM

## 2020-02-24 LAB — POCT GLYCOSYLATED HEMOGLOBIN (HGB A1C): Hemoglobin A1C: 6.8 % — AB (ref 4.0–5.6)

## 2020-02-24 NOTE — Progress Notes (Signed)
[facility] [place], [place] [address]  Internal MEDICINE  Office Visit Note  Patient Name: [name] [name]  [id number]  [id number]  Date of Service: 02/24/2020  Chief Complaint  Patient presents with  . Follow-up  . Diabetes  . Hyperlipidemia    HPI  Pt is here for follow up on Hypothyroid, HLD, DM. She was started on synthroid 68mcg at last visit and has not had labs rechecked. TSH and FT4 ordered today. She is taking pravastatin for HLD.  Her A1C increased by 1 point since last check.  She stopped her metformin, and has been taking [place] and victoza.  She reports her blood sugars first thing the AM are generally around 130mg .   Current Medication: Outpatient Encounter Medications as of 02/24/2020  Medication Sig  . ACCU-CHEK AVIVA PLUS test strip   . Accu-Chek Softclix Lancets lancets Use as instructed. DX E11.65  . albuterol (VENTOLIN HFA) 108 (90 Base) MCG/ACT inhaler Inhale into the lungs.  . conjugated estrogens (PREMARIN) vaginal cream Place 1 Applicatorful vaginally daily. Twice a week  . cyclobenzaprine (FLEXERIL) 10 MG tablet Take 10 mg by mouth 3 (three) times daily as needed for muscle spasms.  . dapagliflozin propanediol (FARXIGA) 5 MG TABS tablet Take 5 mg by mouth daily before breakfast.  . FLUoxetine (PROZAC) 20 MG capsule TAKE 1 CAPSULE BY MOUTH EVERY DAY  . fluticasone (FLONASE) 50 MCG/ACT nasal spray Place 2 sprays into both nostrils daily.  . furosemide (LASIX) 20 MG tablet Take 20 mg by mouth. Take 1/2 tab as needed  . HYDROmorphone (DILAUDID) 4 MG tablet Take by mouth every 6 (six) hours as needed for severe pain.  . Ipratropium-Albuterol (COMBIVENT RESPIMAT) 20-100 MCG/ACT AERS respimat Inhale 1 puff into the lungs every 6 (six) hours.  Marland Kitchen levothyroxine (SYNTHROID) 50 MCG tablet Take 1 tablet (50 mcg total) by mouth daily.  Marland Kitchen liraglutide (VICTOZA) 18 MG/3ML SOPN Inject 1.2 mg into the skin.   . meloxicam (MOBIC) 15 MG tablet  Take 15 mg by mouth daily.  . metFORMIN (GLUCOPHAGE) 500 MG tablet Take 1,000 mg by mouth 2 (two) times daily with a meal.  . Methylnaltrexone Bromide (RELISTOR) 150 MG TABS Take 3 tablets by mouth.  . morphine (MS CONTIN) 60 MG 12 hr tablet Take 60 mg by mouth every 12 (twelve) hours.  . pantoprazole (PROTONIX) 40 MG tablet TAKE 1 TABLET BY MOUTH EVERY DAY  . pravastatin (PRAVACHOL) 10 MG tablet Take 10 mg by mouth daily.  . pregabalin (LYRICA) 150 MG capsule Take 150 mg by mouth 3 (three) times daily.   . promethazine (PHENERGAN) 25 MG tablet Take 1 tablet (25 mg total) by mouth every 6 (six) hours as needed for nausea or vomiting.  . sulfamethoxazole-trimethoprim (BACTRIM DS) 800-160 MG tablet Take 1 tablet by mouth 2 (two) times daily.  . SUMAtriptan (IMITREX) 100 MG tablet Take 100 mg by mouth every 2 (two) hours as needed for migraine. May repeat in 2 hours if headache persists or recurs.   No facility-administered encounter medications on file as of 02/24/2020.    Surgical History: Past Surgical History:  Procedure Laterality Date  . ABDOMINAL HYSTERECTOMY    . CARPAL TUNNEL RELEASE    . KNEE SURGERY    . SPINAL FUSION    . SPINAL FUSION      Medical History: Past Medical History:  Diagnosis Date  . Actinic keratitis   . Allergy   . Anxiety   . Asthma   .  Basal cell carcinoma   . Chronic venous insufficiency   . DDD (degenerative disc disease), lumbar   . Diabetes mellitus without complication ([place])   . Fibromyalgia   . Fibromyalgia   . Hyperlipidemia   . Lymphedema   . Melanoma ([place])   . Migraine   . Mitral valve prolapse     Family History: Family History  Problem Relation Age of Onset  . Cancer Paternal Aunt   . Cancer Paternal Uncle   . Diabetes Paternal Grandfather   . Heart disease Paternal Grandfather     Social History   Socioeconomic History  . Marital status: Divorced    Spouse name: Not on file  . Number of children: Not on file  . Years of  education: Not on file  . Highest education level: Not on file  Occupational History  . Not on file  Tobacco Use  . Smoking status: Never Smoker  . Smokeless tobacco: Never Used  Substance and Sexual Activity  . Alcohol use: No  . Drug use: No  . Sexual activity: Not on file  Other Topics Concern  . Not on file  Social History Narrative  . Not on file   Social Determinants of Health   Financial Resource Strain:   . Difficulty of Paying Living Expenses:   Food Insecurity:   . Worried About Charity fundraiser in the Last Year:   . Arboriculturist in the Last Year:   Transportation Needs:   . Film/video editor (Medical):   Marland Kitchen Lack of Transportation (Non-Medical):   Physical Activity:   . Days of Exercise per Week:   . Minutes of Exercise per Session:   Stress:   . Feeling of Stress :   Social Connections:   . Frequency of Communication with Friends and Family:   . Frequency of Social Gatherings with Friends and Family:   . Attends Religious Services:   . Active Member of Clubs or Organizations:   . Attends Archivist Meetings:   Marland Kitchen Marital Status:   Intimate Partner Violence:   . Fear of Current or Ex-Partner:   . Emotionally Abused:   Marland Kitchen Physically Abused:   . Sexually Abused:       Review of Systems  Constitutional: Negative for chills, fatigue and unexpected weight change.  HENT: Negative for congestion, rhinorrhea, sneezing and sore throat.   Eyes: Negative for photophobia, pain and redness.  Respiratory: Negative for cough, chest tightness and shortness of breath.   Cardiovascular: Negative for chest pain and palpitations.  Gastrointestinal: Negative for abdominal pain, constipation, diarrhea, nausea and vomiting.  Endocrine: Negative.   Genitourinary: Negative for dysuria and frequency.  Musculoskeletal: Negative for arthralgias, back pain, joint swelling and neck pain.  Skin: Negative for rash.  Allergic/Immunologic: Negative.    Neurological: Negative for tremors and numbness.  Hematological: Negative for adenopathy. Does not bruise/bleed easily.  Psychiatric/Behavioral: Negative for behavioral problems and sleep disturbance. The patient is not nervous/anxious.     Vital Signs: BP 124/76   Pulse 93   Temp 97.6 F (36.4 C)   Resp 16   Ht 5\' 1"  (1.549 m)   Wt 168 lb 12.8 oz (76.6 kg)   LMP 12/13/1991   SpO2 94%   BMI 31.89 kg/m    Physical Exam Vitals and nursing note reviewed.  Constitutional:      General: She is not in acute distress.    Appearance: She is well-developed. She is not diaphoretic.  HENT:     Head: Normocephalic and atraumatic.     Mouth/Throat:     Pharynx: No oropharyngeal exudate.  Eyes:     Pupils: Pupils are equal, round, and reactive to light.  Neck:     Thyroid: No thyromegaly.     Vascular: No JVD.     Trachea: No tracheal deviation.  Cardiovascular:     Rate and Rhythm: Normal rate and regular rhythm.     Heart sounds: Normal heart sounds. No murmur. No friction rub. No gallop.   Pulmonary:     Effort: Pulmonary effort is normal. No respiratory distress.     Breath sounds: Normal breath sounds. No wheezing or rales.  Chest:     Chest wall: No tenderness.  Abdominal:     Palpations: Abdomen is soft.     Tenderness: There is no abdominal tenderness. There is no guarding.  Musculoskeletal:        General: Normal range of motion.     Cervical back: Normal range of motion and neck supple.  Lymphadenopathy:     Cervical: No cervical adenopathy.  Skin:    General: Skin is warm and dry.  Neurological:     Mental Status: She is alert and oriented to person, place, and time.     Cranial Nerves: No cranial nerve deficit.  Psychiatric:        Behavior: Behavior normal.        Thought Content: Thought content normal.        Judgment: Judgment normal.    Assessment/Plan: 1. Uncontrolled type 2 diabetes mellitus with hyperglycemia (HCC) Discussed increased A1C,  patient will make dietary change and continue medications, follow up in 3 months.  - POCT HgB A1C  2. Hypothyroidism, unspecified type Have labs drawn - TSH + free T4  3. OSA on CPAP Continue to use cpap nightly as prescribed.   4. Mild intermittent asthma, unspecified whether complicated Stable, continue present management.  5. BMI 31.0-31.9,adult Obesity Counseling: Risk Assessment: An assessment of behavioral risk factors was made today and includes lack of exercise sedentary lifestyle, lack of portion control and poor dietary habits.  Risk Modification Advice: She was counseled on portion control guidelines. Restricting daily caloric intake to 1800. The detrimental long term effects of obesity on her health and ongoing poor compliance was also discussed with the patient.    General Counseling: Rogena verbalizes understanding of the findings of todays visit and agrees with plan of treatment. I have discussed any further diagnostic evaluation that may be needed or ordered today. We also reviewed her medications today. she has been encouraged to call the office with any questions or concerns that should arise related to todays visit.    Orders Placed This Encounter  Procedures  . TSH + free T4  . POCT HgB A1C    No orders of the defined types were placed in this encounter.   Time spent: 30 Minutes   This patient was seen by Orson Gear AGNP-C in Collaboration with Dr Lavera Guise as a part of collaborative care agreement     Kendell Bane AGNP-C Internal medicine

## 2020-02-24 NOTE — Telephone Encounter (Signed)
CONFIRMED AND SCREENED FOR 02-26-20 OV. 

## 2020-02-25 ENCOUNTER — Telehealth: Payer: Self-pay

## 2020-02-25 NOTE — Telephone Encounter (Signed)
lmom to pt to call us back as per adam she can take farxiga 10 mg

## 2020-02-26 ENCOUNTER — Telehealth: Payer: Self-pay

## 2020-02-26 ENCOUNTER — Other Ambulatory Visit: Payer: Self-pay

## 2020-02-26 ENCOUNTER — Encounter: Payer: Self-pay | Admitting: Adult Health

## 2020-02-26 ENCOUNTER — Ambulatory Visit: Payer: Medicare Other

## 2020-02-26 ENCOUNTER — Ambulatory Visit: Payer: Medicare Other | Admitting: Internal Medicine

## 2020-02-26 VITALS — BP 136/74 | HR 108 | Resp 16 | Ht 61.0 in | Wt 168.0 lb

## 2020-02-26 DIAGNOSIS — R21 Rash and other nonspecific skin eruption: Secondary | ICD-10-CM | POA: Diagnosis not present

## 2020-02-26 DIAGNOSIS — J4520 Mild intermittent asthma, uncomplicated: Secondary | ICD-10-CM | POA: Diagnosis not present

## 2020-02-26 DIAGNOSIS — G4733 Obstructive sleep apnea (adult) (pediatric): Secondary | ICD-10-CM | POA: Diagnosis not present

## 2020-02-26 DIAGNOSIS — Z9989 Dependence on other enabling machines and devices: Secondary | ICD-10-CM

## 2020-02-26 MED ORDER — AMMONIUM LACTATE 12 % EX LOTN: 1.0000 "application " | TOPICAL_LOTION | CUTANEOUS | 0 refills | Status: DC | PRN

## 2020-02-26 MED ORDER — VICTOZA 18 MG/3ML ~~LOC~~ SOPN: 1.8000 mg | PEN_INJECTOR | Freq: Every day | SUBCUTANEOUS | 3 refills | Status: DC

## 2020-02-26 MED ORDER — FARXIGA 10 MG PO TABS: 10.0000 mg | ORAL_TABLET | Freq: Every day | ORAL | 5 refills | Status: DC

## 2020-02-26 NOTE — Telephone Encounter (Signed)
As per adam pt advised we going to increase farxiga to 10 mg and send new pres

## 2020-02-26 NOTE — Progress Notes (Signed)
[facility] [place], [place] [address]  Internal MEDICINE  Telephone Visit  Patient Name: [name] [name]  [id number]  [id number]  Date of Service: 02/26/2020  I connected with the patient at [phone] by telephone and verified the patients identity using two identifiers.   I discussed the limitations, risks, security and privacy concerns of performing an evaluation and management service by telephone and the availability of in person appointments. I also discussed with the patient that there may be a patient responsible charge related to the service.  The patient expressed understanding and agrees to proceed.    Chief Complaint  Patient presents with  . Telephone Assessment  . Telephone Screen  . Follow-up    cpap compliance  . Asthma    HPI  Pt is seen via telephone. She is seen today for follow up on asthma and osa.  Her CPAP compliance at last record is 90%. She does report some issues keeping the mask on tight.  She would like to talk to [facility] patient about a different device.  I have left a message for kellie to call her.  She denies any other issues. Pt reports good compliance with CPAP therapy. Cleaning machine by hand, and changing filters and tubing as directed. Denies headaches, sinus issues, palpitations, or hemoptysis. She reports her asthma is generally well controlled.  She uses inhalers as needed.     Current Medication: Outpatient Encounter Medications as of 02/26/2020  Medication Sig  . ACCU-CHEK AVIVA PLUS test strip   . Accu-Chek Softclix Lancets lancets Use as instructed. DX E11.65  . albuterol (VENTOLIN HFA) 108 (90 Base) MCG/ACT inhaler Inhale into the lungs.  . conjugated estrogens (PREMARIN) vaginal cream Place 1 Applicatorful vaginally daily. Twice a week  . cyclobenzaprine (FLEXERIL) 10 MG tablet Take 10 mg by mouth 3 (three) times daily as needed for muscle spasms.  Marland Kitchen FLUoxetine (PROZAC) 20 MG capsule TAKE 1 CAPSULE BY MOUTH  EVERY DAY  . fluticasone (FLONASE) 50 MCG/ACT nasal spray Place 2 sprays into both nostrils daily.  . furosemide (LASIX) 20 MG tablet Take 20 mg by mouth. Take 1/2 tab as needed  . HYDROmorphone (DILAUDID) 4 MG tablet Take by mouth every 6 (six) hours as needed for severe pain.  . Ipratropium-Albuterol (COMBIVENT RESPIMAT) 20-100 MCG/ACT AERS respimat Inhale 1 puff into the lungs every 6 (six) hours.  Marland Kitchen levothyroxine (SYNTHROID) 50 MCG tablet Take 1 tablet (50 mcg total) by mouth daily.  Marland Kitchen liraglutide (VICTOZA) 18 MG/3ML SOPN Inject 0.3 mLs (1.8 mg total) into the skin daily.  . meloxicam (MOBIC) 15 MG tablet Take 15 mg by mouth daily.  . Methylnaltrexone Bromide (RELISTOR) 150 MG TABS Take 3 tablets by mouth.  . morphine (MS CONTIN) 60 MG 12 hr tablet Take 60 mg by mouth every 12 (twelve) hours.  . pantoprazole (PROTONIX) 40 MG tablet TAKE 1 TABLET BY MOUTH EVERY DAY  . pravastatin (PRAVACHOL) 10 MG tablet Take 10 mg by mouth daily.  . pregabalin (LYRICA) 150 MG capsule Take 150 mg by mouth 3 (three) times daily.   . promethazine (PHENERGAN) 25 MG tablet Take 1 tablet (25 mg total) by mouth every 6 (six) hours as needed for nausea or vomiting.  . sulfamethoxazole-trimethoprim (BACTRIM DS) 800-160 MG tablet Take 1 tablet by mouth 2 (two) times daily.  . SUMAtriptan (IMITREX) 100 MG tablet Take 100 mg by mouth every 2 (two) hours as needed for migraine. May repeat in 2 hours if  headache persists or recurs.  . [DISCONTINUED] dapagliflozin propanediol (FARXIGA) 5 MG TABS tablet Take 5 mg by mouth daily before breakfast.  . [DISCONTINUED] liraglutide (VICTOZA) 18 MG/3ML SOPN Inject 1.2 mg into the skin.   Marland Kitchen ammonium lactate (LAC-HYDRIN) 12 % lotion Apply 1 application topically as needed for dry skin.   No facility-administered encounter medications on file as of 02/26/2020.    Surgical History: Past Surgical History:  Procedure Laterality Date  . ABDOMINAL HYSTERECTOMY    . CARPAL TUNNEL  RELEASE    . KNEE SURGERY    . SPINAL FUSION    . SPINAL FUSION      Medical History: Past Medical History:  Diagnosis Date  . Actinic keratitis   . Allergy   . Anxiety   . Asthma   . Basal cell carcinoma   . Chronic venous insufficiency   . DDD (degenerative disc disease), lumbar   . Diabetes mellitus without complication ([place])   . Fibromyalgia   . Fibromyalgia   . Hyperlipidemia   . Lymphedema   . Melanoma ([place])   . Migraine   . Mitral valve prolapse     Family History: Family History  Problem Relation Age of Onset  . Cancer Paternal Aunt   . Cancer Paternal Uncle   . Diabetes Paternal Grandfather   . Heart disease Paternal Grandfather     Social History   Socioeconomic History  . Marital status: Divorced    Spouse name: Not on file  . Number of children: Not on file  . Years of education: Not on file  . Highest education level: Not on file  Occupational History  . Not on file  Tobacco Use  . Smoking status: Never Smoker  . Smokeless tobacco: Never Used  Substance and Sexual Activity  . Alcohol use: No  . Drug use: No  . Sexual activity: Not on file  Other Topics Concern  . Not on file  Social History Narrative  . Not on file   Social Determinants of Health   Financial Resource Strain:   . Difficulty of Paying Living Expenses:   Food Insecurity:   . Worried About Charity fundraiser in the Last Year:   . Arboriculturist in the Last Year:   Transportation Needs:   . Film/video editor (Medical):   Marland Kitchen Lack of Transportation (Non-Medical):   Physical Activity:   . Days of Exercise per Week:   . Minutes of Exercise per Session:   Stress:   . Feeling of Stress :   Social Connections:   . Frequency of Communication with Friends and Family:   . Frequency of Social Gatherings with Friends and Family:   . Attends Religious Services:   . Active Member of Clubs or Organizations:   . Attends Archivist Meetings:   Marland Kitchen Marital Status:    Intimate Partner Violence:   . Fear of Current or Ex-Partner:   . Emotionally Abused:   Marland Kitchen Physically Abused:   . Sexually Abused:       Review of Systems  Constitutional: Negative for chills, fatigue and unexpected weight change.  HENT: Negative for congestion, rhinorrhea, sneezing and sore throat.   Eyes: Negative for photophobia, pain and redness.  Respiratory: Negative for cough, chest tightness and shortness of breath.   Cardiovascular: Negative for chest pain and palpitations.  Gastrointestinal: Negative for abdominal pain, constipation, diarrhea, nausea and vomiting.  Endocrine: Negative.   Genitourinary: Negative for dysuria and frequency.  Musculoskeletal: Negative for arthralgias, back pain, joint swelling and neck pain.  Skin: Negative for rash.  Allergic/Immunologic: Negative.   Neurological: Negative for tremors and numbness.  Hematological: Negative for adenopathy. Does not bruise/bleed easily.  Psychiatric/Behavioral: Negative for behavioral problems and sleep disturbance. The patient is not nervous/anxious.     Vital Signs: BP 136/74   Pulse (!) 108   Resp 16   Ht 5\' 1"  (1.549 m)   Wt 168 lb (76.2 kg)   LMP 12/13/1991   SpO2 98%   BMI 31.74 kg/m    Observation/Objective:  Well sounding, NAD noted.    Assessment/Plan: 1. OSA on CPAP Continue to use cpap as directed  2. Mild intermittent asthma, unspecified whether complicated Stable, continue present management.   3. Rash and nonspecific skin eruption - ammonium lactate (LAC-HYDRIN) 12 % lotion; Apply 1 application topically as needed for dry skin.  Dispense: 400 g; Refill: 0  General Counseling: [name] [name] verbalizes understanding of the findings of today's phone visit and agrees with plan of treatment. I have discussed any further diagnostic evaluation that may be needed or ordered today. We also reviewed her medications today. she has been encouraged to call the office with any questions or  concerns that should arise related to todays visit.    No orders of the defined types were placed in this encounter.   Meds ordered this encounter  Medications  . liraglutide (VICTOZA) 18 MG/3ML SOPN    Sig: Inject 0.3 mLs (1.8 mg total) into the skin daily.    Dispense:  3 pen    Refill:  3  . ammonium lactate (LAC-HYDRIN) 12 % lotion    Sig: Apply 1 application topically as needed for dry skin.    Dispense:  400 g    Refill:  0    Time spent: 25 Minutes    Orson Gear AGNP-C Pulmonary medicine.

## 2020-03-01 ENCOUNTER — Telehealth: Payer: Self-pay

## 2020-03-01 DIAGNOSIS — Z79891 Long term (current) use of opiate analgesic: Secondary | ICD-10-CM | POA: Diagnosis not present

## 2020-03-01 DIAGNOSIS — M25559 Pain in unspecified hip: Secondary | ICD-10-CM | POA: Diagnosis not present

## 2020-03-01 DIAGNOSIS — M25569 Pain in unspecified knee: Secondary | ICD-10-CM | POA: Diagnosis not present

## 2020-03-01 DIAGNOSIS — M545 Low back pain: Secondary | ICD-10-CM | POA: Diagnosis not present

## 2020-03-01 DIAGNOSIS — G894 Chronic pain syndrome: Secondary | ICD-10-CM | POA: Diagnosis not present

## 2020-03-01 NOTE — Telephone Encounter (Signed)
Patient rescheduled appointment on 03/03/2020 to 03/15/2020. klh

## 2020-03-03 ENCOUNTER — Ambulatory Visit: Payer: Medicare Other | Admitting: Adult Health

## 2020-03-09 DIAGNOSIS — E039 Hypothyroidism, unspecified: Secondary | ICD-10-CM | POA: Diagnosis not present

## 2020-03-10 ENCOUNTER — Telehealth: Payer: Self-pay

## 2020-03-10 ENCOUNTER — Encounter: Payer: Self-pay | Admitting: Internal Medicine

## 2020-03-10 LAB — TSH+FREE T4
Free T4: 1.18 ng/dL (ref 0.82–1.77)
TSH: 1.13 u[IU]/mL (ref 0.450–4.500)

## 2020-03-10 NOTE — Telephone Encounter (Signed)
Pt advised thyroid looks great we can discuss in details in next visit

## 2020-03-11 ENCOUNTER — Encounter: Payer: Self-pay | Admitting: Adult Health

## 2020-03-11 ENCOUNTER — Ambulatory Visit (INDEPENDENT_AMBULATORY_CARE_PROVIDER_SITE_OTHER): Payer: Medicare Other | Admitting: Adult Health

## 2020-03-11 DIAGNOSIS — Z9989 Dependence on other enabling machines and devices: Secondary | ICD-10-CM | POA: Diagnosis not present

## 2020-03-11 DIAGNOSIS — G4733 Obstructive sleep apnea (adult) (pediatric): Secondary | ICD-10-CM

## 2020-03-11 DIAGNOSIS — E1165 Type 2 diabetes mellitus with hyperglycemia: Secondary | ICD-10-CM

## 2020-03-11 DIAGNOSIS — R5383 Other fatigue: Secondary | ICD-10-CM | POA: Diagnosis not present

## 2020-03-11 NOTE — Progress Notes (Signed)
[facility] [place], [place] [address]  Internal MEDICINE  Telephone Visit  Patient Name: [name] [name]  [id number]  [id number]  Date of Service: 03/14/2020  I connected with the patient at 1242 by telephone and verified the patients identity using two identifiers.   I discussed the limitations, risks, security and privacy concerns of performing an evaluation and management service by telephone and the availability of in person appointments. I also discussed with the patient that there may be a patient responsible charge related to the service.  The patient expressed understanding and agrees to proceed.    Chief Complaint  Patient presents with  . Telephone Screen  . Telephone Assessment  . Diabetes  . Fatigue  . Follow-up    labs     HPI Pt is seen via telephone for follow up on DM and Fatigue.  Her thyroid levels were tested and found to be normal.  She continues to report fatigue. She does have OSA, and has been using her cpap machine. She also reports her glucometer is broken, and she needs a new RX at this time.     Current Medication: Outpatient Encounter Medications as of 03/11/2020  Medication Sig  . ACCU-CHEK AVIVA PLUS test strip   . Accu-Chek Softclix Lancets lancets Use as instructed. DX E11.65  . albuterol (VENTOLIN HFA) 108 (90 Base) MCG/ACT inhaler Inhale into the lungs.  Marland Kitchen ammonium lactate (LAC-HYDRIN) 12 % lotion Apply 1 application topically as needed for dry skin.  Marland Kitchen conjugated estrogens (PREMARIN) vaginal cream Place 1 Applicatorful vaginally daily. Twice a week  . cyclobenzaprine (FLEXERIL) 10 MG tablet Take 10 mg by mouth 3 (three) times daily as needed for muscle spasms.  . dapagliflozin propanediol (FARXIGA) 10 MG TABS tablet Take 10 mg by mouth daily.  Marland Kitchen FLUoxetine (PROZAC) 20 MG capsule TAKE 1 CAPSULE BY MOUTH EVERY DAY  . fluticasone (FLONASE) 50 MCG/ACT nasal spray Place 2 sprays into both nostrils daily.  . furosemide  (LASIX) 20 MG tablet Take 20 mg by mouth. Take 1/2 tab as needed  . HYDROmorphone (DILAUDID) 4 MG tablet Take by mouth every 6 (six) hours as needed for severe pain.  . Ipratropium-Albuterol (COMBIVENT RESPIMAT) 20-100 MCG/ACT AERS respimat Inhale 1 puff into the lungs every 6 (six) hours.  Marland Kitchen levothyroxine (SYNTHROID) 50 MCG tablet Take 1 tablet (50 mcg total) by mouth daily.  Marland Kitchen liraglutide (VICTOZA) 18 MG/3ML SOPN Inject 0.3 mLs (1.8 mg total) into the skin daily.  . meloxicam (MOBIC) 15 MG tablet Take 15 mg by mouth daily.  . Methylnaltrexone Bromide (RELISTOR) 150 MG TABS Take 3 tablets by mouth.  . morphine (MS CONTIN) 60 MG 12 hr tablet Take 60 mg by mouth every 12 (twelve) hours.  . pantoprazole (PROTONIX) 40 MG tablet TAKE 1 TABLET BY MOUTH EVERY DAY  . pravastatin (PRAVACHOL) 10 MG tablet Take 10 mg by mouth daily.  . pregabalin (LYRICA) 150 MG capsule Take 150 mg by mouth 3 (three) times daily.   . promethazine (PHENERGAN) 25 MG tablet Take 1 tablet (25 mg total) by mouth every 6 (six) hours as needed for nausea or vomiting.  . sulfamethoxazole-trimethoprim (BACTRIM DS) 800-160 MG tablet Take 1 tablet by mouth 2 (two) times daily.  . SUMAtriptan (IMITREX) 100 MG tablet Take 100 mg by mouth every 2 (two) hours as needed for migraine. May repeat in 2 hours if headache persists or recurs.   No facility-administered encounter medications on file as of 03/11/2020.  Surgical History: Past Surgical History:  Procedure Laterality Date  . ABDOMINAL HYSTERECTOMY    . CARPAL TUNNEL RELEASE    . KNEE SURGERY    . SPINAL FUSION    . SPINAL FUSION      Medical History: Past Medical History:  Diagnosis Date  . Actinic keratitis   . Allergy   . Anxiety   . Asthma   . Basal cell carcinoma   . Chronic venous insufficiency   . DDD (degenerative disc disease), lumbar   . Diabetes mellitus without complication ([place])   . Fibromyalgia   . Fibromyalgia   . Hyperlipidemia   . Lymphedema    . Melanoma ([place])   . Migraine   . Mitral valve prolapse     Family History: Family History  Problem Relation Age of Onset  . Cancer Paternal Aunt   . Cancer Paternal Uncle   . Diabetes Paternal Grandfather   . Heart disease Paternal Grandfather     Social History   Socioeconomic History  . Marital status: Divorced    Spouse name: Not on file  . Number of children: Not on file  . Years of education: Not on file  . Highest education level: Not on file  Occupational History  . Not on file  Tobacco Use  . Smoking status: Never Smoker  . Smokeless tobacco: Never Used  Substance and Sexual Activity  . Alcohol use: No  . Drug use: No  . Sexual activity: Not on file  Other Topics Concern  . Not on file  Social History Narrative  . Not on file   Social Determinants of Health   Financial Resource Strain:   . Difficulty of Paying Living Expenses:   Food Insecurity:   . Worried About Charity fundraiser in the Last Year:   . Arboriculturist in the Last Year:   Transportation Needs:   . Film/video editor (Medical):   Marland Kitchen Lack of Transportation (Non-Medical):   Physical Activity:   . Days of Exercise per Week:   . Minutes of Exercise per Session:   Stress:   . Feeling of Stress :   Social Connections:   . Frequency of Communication with Friends and Family:   . Frequency of Social Gatherings with Friends and Family:   . Attends Religious Services:   . Active Member of Clubs or Organizations:   . Attends Archivist Meetings:   Marland Kitchen Marital Status:   Intimate Partner Violence:   . Fear of Current or Ex-Partner:   . Emotionally Abused:   Marland Kitchen Physically Abused:   . Sexually Abused:       Review of Systems  Constitutional: Negative for chills, fatigue and unexpected weight change.  HENT: Negative for congestion, rhinorrhea, sneezing and sore throat.   Eyes: Negative for photophobia, pain and redness.  Respiratory: Negative for cough, chest tightness and  shortness of breath.   Cardiovascular: Negative for chest pain and palpitations.  Gastrointestinal: Negative for abdominal pain, constipation, diarrhea, nausea and vomiting.  Endocrine: Negative.   Genitourinary: Negative for dysuria and frequency.  Musculoskeletal: Negative for arthralgias, back pain, joint swelling and neck pain.  Skin: Negative for rash.  Allergic/Immunologic: Negative.   Neurological: Negative for tremors and numbness.  Hematological: Negative for adenopathy. Does not bruise/bleed easily.  Psychiatric/Behavioral: Negative for behavioral problems and sleep disturbance. The patient is not nervous/anxious.     Vital Signs: LMP 12/13/1991    Observation/Objective: Well sounding, NAD noted.  Assessment/Plan: 1. Uncontrolled type 2 diabetes mellitus with hyperglycemia ([place]) RX for new glucometer sent to Pharmacy. Strips and lancets provided.  - POCT Glucose (Device for Home Use)  2. Other fatigue Will check fatigue labs to monitor levels.  - Vitamin D (25 hydroxy) - B12 and Folate Panel - Fe+TIBC+Fer  3. OSA on CPAP Continue to use cpap as directed  General Counseling: Sanaai verbalizes understanding of the findings of today's phone visit and agrees with plan of treatment. I have discussed any further diagnostic evaluation that may be needed or ordered today. We also reviewed her medications today. she has been encouraged to call the office with any questions or concerns that should arise related to todays visit.    Orders Placed This Encounter  Procedures  . Vitamin D (25 hydroxy)  . B12 and Folate Panel  . Fe+TIBC+Fer  . POCT Glucose (Device for Home Use)    No orders of the defined types were placed in this encounter.   Time spent: [facility] Internal medicine

## 2020-03-15 ENCOUNTER — Ambulatory Visit: Payer: Medicare Other | Admitting: Adult Health

## 2020-03-16 DIAGNOSIS — G4733 Obstructive sleep apnea (adult) (pediatric): Secondary | ICD-10-CM | POA: Diagnosis not present

## 2020-03-18 ENCOUNTER — Other Ambulatory Visit: Payer: Self-pay

## 2020-03-18 DIAGNOSIS — M9904 Segmental and somatic dysfunction of sacral region: Secondary | ICD-10-CM | POA: Diagnosis not present

## 2020-03-18 DIAGNOSIS — M5442 Lumbago with sciatica, left side: Secondary | ICD-10-CM | POA: Diagnosis not present

## 2020-03-18 DIAGNOSIS — M9903 Segmental and somatic dysfunction of lumbar region: Secondary | ICD-10-CM | POA: Diagnosis not present

## 2020-03-18 DIAGNOSIS — M5441 Lumbago with sciatica, right side: Secondary | ICD-10-CM | POA: Diagnosis not present

## 2020-03-22 DIAGNOSIS — M5441 Lumbago with sciatica, right side: Secondary | ICD-10-CM | POA: Diagnosis not present

## 2020-03-22 DIAGNOSIS — M5442 Lumbago with sciatica, left side: Secondary | ICD-10-CM | POA: Diagnosis not present

## 2020-03-22 DIAGNOSIS — M9903 Segmental and somatic dysfunction of lumbar region: Secondary | ICD-10-CM | POA: Diagnosis not present

## 2020-03-22 DIAGNOSIS — M9904 Segmental and somatic dysfunction of sacral region: Secondary | ICD-10-CM | POA: Diagnosis not present

## 2020-03-25 DIAGNOSIS — M1712 Unilateral primary osteoarthritis, left knee: Secondary | ICD-10-CM | POA: Diagnosis not present

## 2020-03-30 DIAGNOSIS — M9904 Segmental and somatic dysfunction of sacral region: Secondary | ICD-10-CM | POA: Diagnosis not present

## 2020-03-30 DIAGNOSIS — M5442 Lumbago with sciatica, left side: Secondary | ICD-10-CM | POA: Diagnosis not present

## 2020-03-30 DIAGNOSIS — M9903 Segmental and somatic dysfunction of lumbar region: Secondary | ICD-10-CM | POA: Diagnosis not present

## 2020-03-30 DIAGNOSIS — G4733 Obstructive sleep apnea (adult) (pediatric): Secondary | ICD-10-CM | POA: Diagnosis not present

## 2020-03-30 DIAGNOSIS — M5441 Lumbago with sciatica, right side: Secondary | ICD-10-CM | POA: Diagnosis not present

## 2020-04-02 DIAGNOSIS — E559 Vitamin D deficiency, unspecified: Secondary | ICD-10-CM | POA: Diagnosis not present

## 2020-04-02 DIAGNOSIS — E611 Iron deficiency: Secondary | ICD-10-CM | POA: Diagnosis not present

## 2020-04-02 DIAGNOSIS — D519 Vitamin B12 deficiency anemia, unspecified: Secondary | ICD-10-CM | POA: Diagnosis not present

## 2020-04-02 DIAGNOSIS — D510 Vitamin B12 deficiency anemia due to intrinsic factor deficiency: Secondary | ICD-10-CM | POA: Diagnosis not present

## 2020-04-02 DIAGNOSIS — R5383 Other fatigue: Secondary | ICD-10-CM | POA: Diagnosis not present

## 2020-04-02 DIAGNOSIS — D508 Other iron deficiency anemias: Secondary | ICD-10-CM | POA: Diagnosis not present

## 2020-04-03 LAB — VITAMIN D 25 HYDROXY (VIT D DEFICIENCY, FRACTURES): Vit D, 25-Hydroxy: 21.2 ng/mL — ABNORMAL LOW (ref 30.0–100.0)

## 2020-04-03 LAB — IRON,TIBC AND FERRITIN PANEL
Ferritin: 39 ng/mL (ref 15–150)
Iron Saturation: 28 % (ref 15–55)
Iron: 88 ug/dL (ref 27–159)
Total Iron Binding Capacity: 314 ug/dL (ref 250–450)
UIBC: 226 ug/dL (ref 131–425)

## 2020-04-03 LAB — B12 AND FOLATE PANEL
Folate: 5.1 ng/mL (ref >3.0)
Vitamin B-12: 268 pg/mL (ref 232–1245)

## 2020-04-06 ENCOUNTER — Encounter: Payer: Self-pay | Admitting: Internal Medicine

## 2020-04-06 DIAGNOSIS — M5441 Lumbago with sciatica, right side: Secondary | ICD-10-CM | POA: Diagnosis not present

## 2020-04-06 DIAGNOSIS — M9903 Segmental and somatic dysfunction of lumbar region: Secondary | ICD-10-CM | POA: Diagnosis not present

## 2020-04-06 DIAGNOSIS — M9904 Segmental and somatic dysfunction of sacral region: Secondary | ICD-10-CM | POA: Diagnosis not present

## 2020-04-06 DIAGNOSIS — M5442 Lumbago with sciatica, left side: Secondary | ICD-10-CM | POA: Diagnosis not present

## 2020-04-07 ENCOUNTER — Other Ambulatory Visit: Payer: Self-pay | Admitting: Adult Health

## 2020-04-07 MED ORDER — VITAMIN D (ERGOCALCIFEROL) 1.25 MG (50000 UNIT) PO CAPS: 50000.0000 [IU] | ORAL_CAPSULE | ORAL | 0 refills | Status: DC

## 2020-04-07 NOTE — Progress Notes (Signed)
Vit D sent to patients pharmacy.  Will have her come in for B12 injections also.

## 2020-04-12 DIAGNOSIS — M25569 Pain in unspecified knee: Secondary | ICD-10-CM | POA: Diagnosis not present

## 2020-04-12 DIAGNOSIS — G894 Chronic pain syndrome: Secondary | ICD-10-CM | POA: Diagnosis not present

## 2020-04-12 DIAGNOSIS — M25559 Pain in unspecified hip: Secondary | ICD-10-CM | POA: Diagnosis not present

## 2020-04-12 DIAGNOSIS — M545 Low back pain: Secondary | ICD-10-CM | POA: Diagnosis not present

## 2020-04-12 DIAGNOSIS — Z79891 Long term (current) use of opiate analgesic: Secondary | ICD-10-CM | POA: Diagnosis not present

## 2020-04-13 DIAGNOSIS — M9903 Segmental and somatic dysfunction of lumbar region: Secondary | ICD-10-CM | POA: Diagnosis not present

## 2020-04-13 DIAGNOSIS — M9904 Segmental and somatic dysfunction of sacral region: Secondary | ICD-10-CM | POA: Diagnosis not present

## 2020-04-13 DIAGNOSIS — M5442 Lumbago with sciatica, left side: Secondary | ICD-10-CM | POA: Diagnosis not present

## 2020-04-13 DIAGNOSIS — M5441 Lumbago with sciatica, right side: Secondary | ICD-10-CM | POA: Diagnosis not present

## 2020-04-15 ENCOUNTER — Encounter: Payer: Self-pay | Admitting: Adult Health

## 2020-04-15 ENCOUNTER — Other Ambulatory Visit: Payer: Self-pay

## 2020-04-15 ENCOUNTER — Ambulatory Visit (INDEPENDENT_AMBULATORY_CARE_PROVIDER_SITE_OTHER): Payer: Medicare Other | Admitting: Adult Health

## 2020-04-15 VITALS — BP 125/86 | HR 82 | Temp 97.6°F | Resp 16 | Ht 61.0 in | Wt 167.0 lb

## 2020-04-15 DIAGNOSIS — E559 Vitamin D deficiency, unspecified: Secondary | ICD-10-CM

## 2020-04-15 DIAGNOSIS — M5442 Lumbago with sciatica, left side: Secondary | ICD-10-CM

## 2020-04-15 DIAGNOSIS — G4733 Obstructive sleep apnea (adult) (pediatric): Secondary | ICD-10-CM

## 2020-04-15 DIAGNOSIS — E782 Mixed hyperlipidemia: Secondary | ICD-10-CM | POA: Diagnosis not present

## 2020-04-15 DIAGNOSIS — M5441 Lumbago with sciatica, right side: Secondary | ICD-10-CM

## 2020-04-15 DIAGNOSIS — Z9989 Dependence on other enabling machines and devices: Secondary | ICD-10-CM

## 2020-04-15 DIAGNOSIS — E538 Deficiency of other specified B group vitamins: Secondary | ICD-10-CM | POA: Diagnosis not present

## 2020-04-15 DIAGNOSIS — J4520 Mild intermittent asthma, uncomplicated: Secondary | ICD-10-CM

## 2020-04-15 DIAGNOSIS — E1165 Type 2 diabetes mellitus with hyperglycemia: Secondary | ICD-10-CM

## 2020-04-15 DIAGNOSIS — G8929 Other chronic pain: Secondary | ICD-10-CM

## 2020-04-15 MED ORDER — VITAMIN D (ERGOCALCIFEROL) 1.25 MG (50000 UNIT) PO CAPS: 50000.0000 [IU] | ORAL_CAPSULE | ORAL | 0 refills | Status: DC

## 2020-04-15 MED ORDER — PRAVASTATIN SODIUM 10 MG PO TABS: 10.0000 mg | ORAL_TABLET | Freq: Every day | ORAL | 2 refills | Status: DC

## 2020-04-15 MED ORDER — CYANOCOBALAMIN 1000 MCG/ML IJ SOLN
1000.0000 ug | Freq: Once | INTRAMUSCULAR | Status: AC
  Administered 2020-04-15: 1000 ug via INTRAMUSCULAR

## 2020-04-15 NOTE — Progress Notes (Signed)
[facility] [place], [place] [address]  Internal MEDICINE  Office Visit Note  Patient Name: [name] [name]  [id number]  [id number]  Date of Service: 04/15/2020  Chief Complaint  Patient presents with  . Follow-up    discuss labwork  . Diabetes  . Hyperlipidemia    HPI  Pt is here for follow up on labs as well as DM, and HLD. Last A1C was 6.8, patient continues to work on lifestyle modifications.  She also is treated for asthma and osa. She continues to use cpap nightly with good results. She is taking pravastatin for her HLD.  Denis any issues.  requesting refill.   She has a history of chronic back pain, and is requesting a referral to pain management at this time.    Current Medication: Outpatient Encounter Medications as of 04/15/2020  Medication Sig  . ACCU-CHEK AVIVA PLUS test strip   . Accu-Chek Softclix Lancets lancets Use as instructed. DX E11.65  . albuterol (VENTOLIN HFA) 108 (90 Base) MCG/ACT inhaler Inhale into the lungs.  Marland Kitchen ammonium lactate (LAC-HYDRIN) 12 % lotion Apply 1 application topically as needed for dry skin.  Marland Kitchen conjugated estrogens (PREMARIN) vaginal cream Place 1 Applicatorful vaginally daily. Twice a week  . cyclobenzaprine (FLEXERIL) 10 MG tablet Take 10 mg by mouth 3 (three) times daily as needed for muscle spasms.  . dapagliflozin propanediol (FARXIGA) 10 MG TABS tablet Take 10 mg by mouth daily.  Marland Kitchen FLUoxetine (PROZAC) 20 MG capsule TAKE 1 CAPSULE BY MOUTH EVERY DAY  . fluticasone (FLONASE) 50 MCG/ACT nasal spray Place 2 sprays into both nostrils daily.  . furosemide (LASIX) 20 MG tablet Take 20 mg by mouth. Take 1/2 tab as needed  . HYDROmorphone (DILAUDID) 4 MG tablet Take by mouth every 6 (six) hours as needed for severe pain.  . Ipratropium-Albuterol (COMBIVENT RESPIMAT) 20-100 MCG/ACT AERS respimat Inhale 1 puff into the lungs every 6 (six) hours.  Marland Kitchen levothyroxine (SYNTHROID) 50 MCG tablet Take 1 tablet (50 mcg total) by  mouth daily.  Marland Kitchen liraglutide (VICTOZA) 18 MG/3ML SOPN Inject 0.3 mLs (1.8 mg total) into the skin daily.  . meloxicam (MOBIC) 15 MG tablet Take 15 mg by mouth daily.  . Methylnaltrexone Bromide (RELISTOR) 150 MG TABS Take 3 tablets by mouth.  . morphine (MS CONTIN) 60 MG 12 hr tablet Take 60 mg by mouth every 12 (twelve) hours.  . pantoprazole (PROTONIX) 40 MG tablet TAKE 1 TABLET BY MOUTH EVERY DAY  . pravastatin (PRAVACHOL) 10 MG tablet Take 10 mg by mouth daily.  . pregabalin (LYRICA) 150 MG capsule Take 150 mg by mouth 3 (three) times daily.   . promethazine (PHENERGAN) 25 MG tablet Take 1 tablet (25 mg total) by mouth every 6 (six) hours as needed for nausea or vomiting.  . sulfamethoxazole-trimethoprim (BACTRIM DS) 800-160 MG tablet Take 1 tablet by mouth 2 (two) times daily.  . SUMAtriptan (IMITREX) 100 MG tablet Take 100 mg by mouth every 2 (two) hours as needed for migraine. May repeat in 2 hours if headache persists or recurs.  . Vitamin D, Ergocalciferol, (DRISDOL) 1.25 MG (50000 UNIT) CAPS capsule Take 1 capsule (50,000 Units total) by mouth every 7 (seven) days.  . [DISCONTINUED] Vitamin D, Ergocalciferol, (DRISDOL) 1.25 MG (50000 UNIT) CAPS capsule Take 1 capsule (50,000 Units total) by mouth every 7 (seven) days.  . [EXPIRED] cyanocobalamin ((VITAMIN B-12)) injection 1,000 mcg    No facility-administered encounter medications on file as of 04/15/2020.  Surgical History: Past Surgical History:  Procedure Laterality Date  . ABDOMINAL HYSTERECTOMY    . CARPAL TUNNEL RELEASE    . KNEE SURGERY    . SPINAL FUSION    . SPINAL FUSION      Medical History: Past Medical History:  Diagnosis Date  . Actinic keratitis   . Allergy   . Anxiety   . Asthma   . Basal cell carcinoma   . Chronic venous insufficiency   . DDD (degenerative disc disease), lumbar   . Diabetes mellitus without complication ([place])   . Fibromyalgia   . Fibromyalgia   . Hyperlipidemia   . Lymphedema   .  Melanoma ([place])   . Migraine   . Mitral valve prolapse     Family History: Family History  Problem Relation Age of Onset  . Cancer Paternal Aunt   . Cancer Paternal Uncle   . Diabetes Paternal Grandfather   . Heart disease Paternal Grandfather     Social History   Socioeconomic History  . Marital status: Divorced    Spouse name: Not on file  . Number of children: Not on file  . Years of education: Not on file  . Highest education level: Not on file  Occupational History  . Not on file  Tobacco Use  . Smoking status: Never Smoker  . Smokeless tobacco: Never Used  Substance and Sexual Activity  . Alcohol use: No  . Drug use: No  . Sexual activity: Not on file  Other Topics Concern  . Not on file  Social History Narrative  . Not on file   Social Determinants of Health   Financial Resource Strain:   . Difficulty of Paying Living Expenses:   Food Insecurity:   . Worried About Charity fundraiser in the Last Year:   . Arboriculturist in the Last Year:   Transportation Needs:   . Film/video editor (Medical):   Marland Kitchen Lack of Transportation (Non-Medical):   Physical Activity:   . Days of Exercise per Week:   . Minutes of Exercise per Session:   Stress:   . Feeling of Stress :   Social Connections:   . Frequency of Communication with Friends and Family:   . Frequency of Social Gatherings with Friends and Family:   . Attends Religious Services:   . Active Member of Clubs or Organizations:   . Attends Archivist Meetings:   Marland Kitchen Marital Status:   Intimate Partner Violence:   . Fear of Current or Ex-Partner:   . Emotionally Abused:   Marland Kitchen Physically Abused:   . Sexually Abused:       Review of Systems  Constitutional: Negative for chills, fatigue and unexpected weight change.  HENT: Negative for congestion, rhinorrhea, sneezing and sore throat.   Eyes: Negative for photophobia, pain and redness.  Respiratory: Negative for cough, chest tightness and  shortness of breath.   Cardiovascular: Negative for chest pain and palpitations.  Gastrointestinal: Negative for abdominal pain, constipation, diarrhea, nausea and vomiting.  Endocrine: Negative.   Genitourinary: Negative for dysuria and frequency.  Musculoskeletal: Negative for arthralgias, back pain, joint swelling and neck pain.  Skin: Negative for rash.  Allergic/Immunologic: Negative.   Neurological: Negative for tremors and numbness.  Hematological: Negative for adenopathy. Does not bruise/bleed easily.  Psychiatric/Behavioral: Negative for behavioral problems and sleep disturbance. The patient is not nervous/anxious.     Vital Signs: BP 125/86   Pulse 82   Temp 97.6 F (  36.4 C)   Resp 16   Ht 5\' 1"  (1.549 m)   Wt 167 lb (75.8 kg)   LMP 12/13/1991   SpO2 95%   BMI 31.55 kg/m    Physical Exam Vitals and nursing note reviewed.  Constitutional:      General: She is not in acute distress.    Appearance: She is well-developed. She is not diaphoretic.  HENT:     Head: Normocephalic and atraumatic.     Mouth/Throat:     Pharynx: No oropharyngeal exudate.  Eyes:     Pupils: Pupils are equal, round, and reactive to light.  Neck:     Thyroid: No thyromegaly.     Vascular: No JVD.     Trachea: No tracheal deviation.  Cardiovascular:     Rate and Rhythm: Normal rate and regular rhythm.     Heart sounds: Normal heart sounds. No murmur. No friction rub. No gallop.   Pulmonary:     Effort: Pulmonary effort is normal. No respiratory distress.     Breath sounds: Normal breath sounds. No wheezing or rales.  Chest:     Chest wall: No tenderness.  Abdominal:     Palpations: Abdomen is soft.     Tenderness: There is no abdominal tenderness. There is no guarding.  Musculoskeletal:        General: Normal range of motion.     Cervical back: Normal range of motion and neck supple.  Lymphadenopathy:     Cervical: No cervical adenopathy.  Skin:    General: Skin is warm and dry.   Neurological:     Mental Status: She is alert and oriented to person, place, and time.     Cranial Nerves: No cranial nerve deficit.  Psychiatric:        Behavior: Behavior normal.        Thought Content: Thought content normal.        Judgment: Judgment normal.    Assessment/Plan: 1. B12 deficiency b12 injection today, continue to monitor.  - cyanocobalamin ((VITAMIN B-12)) injection 1,000 mcg  2. Uncontrolled type 2 diabetes mellitus with hyperglycemia ([facility]) Continue lifestyle modifications and dietary changes.    3. Vitamin D deficiency Take Vit D as directed.  - Vitamin D, Ergocalciferol, (DRISDOL) 1.25 MG (50000 UNIT) CAPS capsule; Take 1 capsule (50,000 Units total) by mouth every 7 (seven) days.  Dispense: 8 capsule; Refill: 0  4. Chronic bilateral low back pain with bilateral sciatica Referral to pain management for evaluation.  - Ambulatory referral to Pain Clinic  5. Mixed hyperlipidemia Continue pravastatin as directed.  - pravastatin (PRAVACHOL) 10 MG tablet; Take 1 tablet (10 mg total) by mouth daily.  Dispense: 90 tablet; Refill: 2  6. OSA on CPAP Continue to wear cpap at night, or while napping during the day.    7. Mild intermittent asthma, unspecified whether complicated Stable, continue with inhalers as prescribed.    General Counseling: Charee verbalizes understanding of the findings of todays visit and agrees with plan of treatment. I have discussed any further diagnostic evaluation that may be needed or ordered today. We also reviewed her medications today. she has been encouraged to call the office with any questions or concerns that should arise related to todays visit.    Orders Placed This Encounter  Procedures  . Ambulatory referral to Pain Clinic    Meds ordered this encounter  Medications  . cyanocobalamin ((VITAMIN B-12)) injection 1,000 mcg  . Vitamin D, Ergocalciferol, (DRISDOL) 1.25 MG (50000  UNIT) CAPS capsule    Sig: Take 1 capsule  (50,000 Units total) by mouth every 7 (seven) days.    Dispense:  8 capsule    Refill:  0    Time spent: 30 Minutes   This patient was seen by Orson Gear AGNP-C in Collaboration with Dr Lavera Guise as a part of collaborative care agreement     Kendell Bane AGNP-C Internal medicine

## 2020-04-20 DIAGNOSIS — M5442 Lumbago with sciatica, left side: Secondary | ICD-10-CM | POA: Diagnosis not present

## 2020-04-20 DIAGNOSIS — M5441 Lumbago with sciatica, right side: Secondary | ICD-10-CM | POA: Diagnosis not present

## 2020-04-20 DIAGNOSIS — M9903 Segmental and somatic dysfunction of lumbar region: Secondary | ICD-10-CM | POA: Diagnosis not present

## 2020-04-20 DIAGNOSIS — M9904 Segmental and somatic dysfunction of sacral region: Secondary | ICD-10-CM | POA: Diagnosis not present

## 2020-04-22 ENCOUNTER — Ambulatory Visit
Admission: RE | Admit: 2020-04-22 | Discharge: 2020-04-22 | Disposition: A | Discharge status: Home / Self Care | Payer: Medicare Other | Source: Ambulatory Visit | Attending: Adult Health | Admitting: Adult Health

## 2020-04-22 ENCOUNTER — Ambulatory Visit: Payer: Medicare Other

## 2020-04-22 ENCOUNTER — Ambulatory Visit (INDEPENDENT_AMBULATORY_CARE_PROVIDER_SITE_OTHER): Payer: Medicare Other | Admitting: Adult Health

## 2020-04-22 ENCOUNTER — Ambulatory Visit
Admission: RE | Admit: 2020-04-22 | Discharge: 2020-04-22 | Disposition: A | Discharge status: Home / Self Care | Payer: Medicare Other | Attending: Adult Health | Admitting: Adult Health

## 2020-04-22 ENCOUNTER — Other Ambulatory Visit: Payer: Self-pay

## 2020-04-22 VITALS — BP 124/80 | HR 92 | Temp 97.5°F | Resp 16 | Ht 61.0 in | Wt 168.2 lb

## 2020-04-22 DIAGNOSIS — M25572 Pain in left ankle and joints of left foot: Secondary | ICD-10-CM

## 2020-04-22 DIAGNOSIS — E538 Deficiency of other specified B group vitamins: Secondary | ICD-10-CM

## 2020-04-22 DIAGNOSIS — M7989 Other specified soft tissue disorders: Secondary | ICD-10-CM | POA: Diagnosis not present

## 2020-04-22 MED ORDER — CYANOCOBALAMIN 1000 MCG/ML IJ SOLN
1000.0000 ug | Freq: Once | INTRAMUSCULAR | Status: AC
  Administered 2020-04-22: 1000 ug via INTRAMUSCULAR

## 2020-04-22 NOTE — Progress Notes (Signed)
[facility] [place], [place] [address]  Internal MEDICINE  Office Visit Note  Patient Name: [name] [name]  [id number]  [id number]  Date of Service: 04/22/2020  Chief Complaint  Patient presents with  . Acute Visit    pulled tendon in left ankle     HPI Pt is here for a sick visit. She  Reports just over a week ago she noticed a burning pain in her left ankle.  She does not recall an injury or trauma.  Although, she does report having to pull herself up onto her deck because there are no stairs.  She reports over the last week it has progress to an aching and stabbing pain.      Current Medication:  Outpatient Encounter Medications as of 04/22/2020  Medication Sig  . ACCU-CHEK AVIVA PLUS test strip   . Accu-Chek Softclix Lancets lancets Use as instructed. DX E11.65  . albuterol (VENTOLIN HFA) 108 (90 Base) MCG/ACT inhaler Inhale into the lungs.  Marland Kitchen ammonium lactate (LAC-HYDRIN) 12 % lotion Apply 1 application topically as needed for dry skin.  Marland Kitchen conjugated estrogens (PREMARIN) vaginal cream Place 1 Applicatorful vaginally daily. Twice a week  . cyclobenzaprine (FLEXERIL) 10 MG tablet Take 10 mg by mouth 3 (three) times daily as needed for muscle spasms.  . dapagliflozin propanediol (FARXIGA) 10 MG TABS tablet Take 10 mg by mouth daily.  Marland Kitchen FLUoxetine (PROZAC) 20 MG capsule TAKE 1 CAPSULE BY MOUTH EVERY DAY  . fluticasone (FLONASE) 50 MCG/ACT nasal spray Place 2 sprays into both nostrils daily.  . furosemide (LASIX) 20 MG tablet Take 20 mg by mouth. Take 1/2 tab as needed  . HYDROmorphone (DILAUDID) 4 MG tablet Take by mouth every 6 (six) hours as needed for severe pain.  . Ipratropium-Albuterol (COMBIVENT RESPIMAT) 20-100 MCG/ACT AERS respimat Inhale 1 puff into the lungs every 6 (six) hours.  Marland Kitchen levothyroxine (SYNTHROID) 50 MCG tablet Take 1 tablet (50 mcg total) by mouth daily.  Marland Kitchen liraglutide (VICTOZA) 18 MG/3ML SOPN Inject 0.3 mLs (1.8 mg total)  into the skin daily.  . meloxicam (MOBIC) 15 MG tablet Take 15 mg by mouth daily.  . Methylnaltrexone Bromide (RELISTOR) 150 MG TABS Take 3 tablets by mouth.  . morphine (MS CONTIN) 60 MG 12 hr tablet Take 60 mg by mouth every 12 (twelve) hours.  . pantoprazole (PROTONIX) 40 MG tablet TAKE 1 TABLET BY MOUTH EVERY DAY  . pravastatin (PRAVACHOL) 10 MG tablet Take 1 tablet (10 mg total) by mouth daily.  . pregabalin (LYRICA) 150 MG capsule Take 150 mg by mouth 3 (three) times daily.   . promethazine (PHENERGAN) 25 MG tablet Take 1 tablet (25 mg total) by mouth every 6 (six) hours as needed for nausea or vomiting.  . sulfamethoxazole-trimethoprim (BACTRIM DS) 800-160 MG tablet Take 1 tablet by mouth 2 (two) times daily.  . SUMAtriptan (IMITREX) 100 MG tablet Take 100 mg by mouth every 2 (two) hours as needed for migraine. May repeat in 2 hours if headache persists or recurs.  . Vitamin D, Ergocalciferol, (DRISDOL) 1.25 MG (50000 UNIT) CAPS capsule Take 1 capsule (50,000 Units total) by mouth every 7 (seven) days.  . [EXPIRED] cyanocobalamin ((VITAMIN B-12)) injection 1,000 mcg    No facility-administered encounter medications on file as of 04/22/2020.      Medical History: Past Medical History:  Diagnosis Date  . Actinic keratitis   . Allergy   . Anxiety   . Asthma   .  Basal cell carcinoma   . Chronic venous insufficiency   . DDD (degenerative disc disease), lumbar   . Diabetes mellitus without complication ([place])   . Fibromyalgia   . Fibromyalgia   . Hyperlipidemia   . Lymphedema   . Melanoma ([place])   . Migraine   . Mitral valve prolapse      Vital Signs: BP 124/80   Pulse 92   Temp (!) 97.5 F (36.4 C)   Resp 16   Ht 5\' 1"  (1.549 m)   Wt 168 lb 3.2 oz (76.3 kg)   LMP 12/13/1991   SpO2 93%   BMI 31.78 kg/m    Review of Systems  Constitutional: Negative for chills, fatigue and unexpected weight change.  HENT: Negative for congestion, rhinorrhea, sneezing and sore  throat.   Eyes: Negative for photophobia, pain and redness.  Respiratory: Negative for cough, chest tightness and shortness of breath.   Cardiovascular: Negative for chest pain and palpitations.  Gastrointestinal: Negative for abdominal pain, constipation, diarrhea, nausea and vomiting.  Endocrine: Negative.   Genitourinary: Negative for dysuria and frequency.  Musculoskeletal: Negative for arthralgias, back pain, joint swelling and neck pain.  Skin: Negative for rash.  Allergic/Immunologic: Negative.   Neurological: Negative for tremors and numbness.  Hematological: Negative for adenopathy. Does not bruise/bleed easily.  Psychiatric/Behavioral: Negative for behavioral problems and sleep disturbance. The patient is not nervous/anxious.     Physical Exam Vitals and nursing note reviewed.  Constitutional:      General: She is not in acute distress.    Appearance: She is well-developed. She is not diaphoretic.  HENT:     Head: Normocephalic and atraumatic.     Mouth/Throat:     Pharynx: No oropharyngeal exudate.  Eyes:     Pupils: Pupils are equal, round, and reactive to light.  Neck:     Thyroid: No thyromegaly.     Vascular: No JVD.     Trachea: No tracheal deviation.  Cardiovascular:     Rate and Rhythm: Normal rate and regular rhythm.     Heart sounds: Normal heart sounds. No murmur. No friction rub. No gallop.   Pulmonary:     Effort: Pulmonary effort is normal. No respiratory distress.     Breath sounds: Normal breath sounds. No wheezing or rales.  Chest:     Chest wall: No tenderness.  Abdominal:     Palpations: Abdomen is soft.     Tenderness: There is no abdominal tenderness. There is no guarding.  Musculoskeletal:        General: Normal range of motion.     Cervical back: Normal range of motion and neck supple.  Lymphadenopathy:     Cervical: No cervical adenopathy.  Skin:    General: Skin is warm and dry.  Neurological:     Mental Status: She is alert and  oriented to person, place, and time.     Cranial Nerves: No cranial nerve deficit.  Psychiatric:        Behavior: Behavior normal.        Thought Content: Thought content normal.        Judgment: Judgment normal.    Assessment/Plan: 1. Acute left ankle pain Have x ray completed, and follow up when results are available.  Use R/I.C.E. method.  Ice for 20 minutes on Two hours off.  Use nsaids and keep foot elevated.  - DG Ankle Complete Left; Future  2. B12 deficiency Given B12 injection today. - cyanocobalamin ((VITAMIN B-12)) injection  1,000 mcg  General Counseling: Anzleigh verbalizes understanding of the findings of todays visit and agrees with plan of treatment. I have discussed any further diagnostic evaluation that may be needed or ordered today. We also reviewed her medications today. she has been encouraged to call the office with any questions or concerns that should arise related to todays visit.   No orders of the defined types were placed in this encounter.   Meds ordered this encounter  Medications  . cyanocobalamin ((VITAMIN B-12)) injection 1,000 mcg    Time spent: 25 Minutes  This patient was seen by Orson Gear AGNP-C in Collaboration with Dr Lavera Guise as a part of collaborative care agreement.  Kendell Bane AGNP-C Internal Medicine

## 2020-04-28 DIAGNOSIS — M9903 Segmental and somatic dysfunction of lumbar region: Secondary | ICD-10-CM | POA: Diagnosis not present

## 2020-04-28 DIAGNOSIS — M5442 Lumbago with sciatica, left side: Secondary | ICD-10-CM | POA: Diagnosis not present

## 2020-04-28 DIAGNOSIS — M5441 Lumbago with sciatica, right side: Secondary | ICD-10-CM | POA: Diagnosis not present

## 2020-04-28 DIAGNOSIS — M9904 Segmental and somatic dysfunction of sacral region: Secondary | ICD-10-CM | POA: Diagnosis not present

## 2020-04-29 ENCOUNTER — Encounter: Payer: Self-pay | Admitting: Adult Health

## 2020-04-29 ENCOUNTER — Ambulatory Visit (INDEPENDENT_AMBULATORY_CARE_PROVIDER_SITE_OTHER): Payer: Medicare Other | Admitting: Adult Health

## 2020-04-29 ENCOUNTER — Ambulatory Visit: Payer: Medicare Other

## 2020-04-29 ENCOUNTER — Other Ambulatory Visit: Payer: Self-pay

## 2020-04-29 VITALS — BP 136/87 | HR 101 | Temp 98.0°F | Resp 16 | Ht 61.0 in | Wt 167.8 lb

## 2020-04-29 DIAGNOSIS — E538 Deficiency of other specified B group vitamins: Secondary | ICD-10-CM

## 2020-04-29 DIAGNOSIS — M25572 Pain in left ankle and joints of left foot: Secondary | ICD-10-CM

## 2020-04-29 MED ORDER — PREDNISONE 10 MG PO TABS: ORAL_TABLET | ORAL | 0 refills | Status: DC

## 2020-04-29 NOTE — Progress Notes (Signed)
[facility] [place], [place] [address]  Internal MEDICINE  Office Visit Note  Patient Name: [name] [name]  [id number]  [id number]  Date of Service: 04/29/2020  Chief Complaint  Patient presents with  . Acute Visit    still having issues with foot, problem sending referral to pain dr, sinus problems     HPI Pt is here for a sick visit. She continues to reports left foot pain.  She has been using an Ace bandage, and taking ibuprofen. Her Xray showed soft tissue swelling, without fracture or other issue.       Current Medication:  Outpatient Encounter Medications as of 04/29/2020  Medication Sig  . ACCU-CHEK AVIVA PLUS test strip   . Accu-Chek Softclix Lancets lancets Use as instructed. DX E11.65  . albuterol (VENTOLIN HFA) 108 (90 Base) MCG/ACT inhaler Inhale into the lungs.  Marland Kitchen ammonium lactate (LAC-HYDRIN) 12 % lotion Apply 1 application topically as needed for dry skin.  Marland Kitchen conjugated estrogens (PREMARIN) vaginal cream Place 1 Applicatorful vaginally daily. Twice a week  . cyclobenzaprine (FLEXERIL) 10 MG tablet Take 10 mg by mouth 3 (three) times daily as needed for muscle spasms.  . dapagliflozin propanediol (FARXIGA) 10 MG TABS tablet Take 10 mg by mouth daily.  Marland Kitchen FLUoxetine (PROZAC) 20 MG capsule TAKE 1 CAPSULE BY MOUTH EVERY DAY  . fluticasone (FLONASE) 50 MCG/ACT nasal spray Place 2 sprays into both nostrils daily.  . furosemide (LASIX) 20 MG tablet Take 20 mg by mouth. Take 1/2 tab as needed  . HYDROmorphone (DILAUDID) 4 MG tablet Take by mouth every 6 (six) hours as needed for severe pain.  . Ipratropium-Albuterol (COMBIVENT RESPIMAT) 20-100 MCG/ACT AERS respimat Inhale 1 puff into the lungs every 6 (six) hours.  Marland Kitchen levothyroxine (SYNTHROID) 50 MCG tablet Take 1 tablet (50 mcg total) by mouth daily.  Marland Kitchen liraglutide (VICTOZA) 18 MG/3ML SOPN Inject 0.3 mLs (1.8 mg total) into the skin daily.  . meloxicam (MOBIC) 15 MG tablet Take 15 mg by  mouth daily.  . Methylnaltrexone Bromide (RELISTOR) 150 MG TABS Take 3 tablets by mouth.  . morphine (MS CONTIN) 60 MG 12 hr tablet Take 60 mg by mouth every 12 (twelve) hours.  . pantoprazole (PROTONIX) 40 MG tablet TAKE 1 TABLET BY MOUTH EVERY DAY  . pravastatin (PRAVACHOL) 10 MG tablet Take 1 tablet (10 mg total) by mouth daily.  . pregabalin (LYRICA) 150 MG capsule Take 150 mg by mouth 3 (three) times daily.   . promethazine (PHENERGAN) 25 MG tablet Take 1 tablet (25 mg total) by mouth every 6 (six) hours as needed for nausea or vomiting.  . sulfamethoxazole-trimethoprim (BACTRIM DS) 800-160 MG tablet Take 1 tablet by mouth 2 (two) times daily.  . SUMAtriptan (IMITREX) 100 MG tablet Take 100 mg by mouth every 2 (two) hours as needed for migraine. May repeat in 2 hours if headache persists or recurs.  . Vitamin D, Ergocalciferol, (DRISDOL) 1.25 MG (50000 UNIT) CAPS capsule Take 1 capsule (50,000 Units total) by mouth every 7 (seven) days.   No facility-administered encounter medications on file as of 04/29/2020.      Medical History: Past Medical History:  Diagnosis Date  . Actinic keratitis   . Allergy   . Anxiety   . Asthma   . Basal cell carcinoma   . Chronic venous insufficiency   . DDD (degenerative disc disease), lumbar   . Diabetes mellitus without complication ([place])   . Fibromyalgia   .  Fibromyalgia   . Hyperlipidemia   . Lymphedema   . Melanoma ([place])   . Migraine   . Mitral valve prolapse      Vital Signs: BP 136/87   Pulse (!) 101   Temp 98 F (36.7 C)   Resp 16   Ht 5\' 1"  (1.549 m)   Wt 167 lb 12.8 oz (76.1 kg)   LMP 12/13/1991   SpO2 97%   BMI 31.71 kg/m    Review of Systems  Constitutional: Negative for chills, fatigue and unexpected weight change.  HENT: Negative for congestion, rhinorrhea, sneezing and sore throat.   Eyes: Negative for photophobia, pain and redness.  Respiratory: Negative for cough, chest tightness and shortness of breath.    Cardiovascular: Negative for chest pain and palpitations.  Gastrointestinal: Negative for abdominal pain, constipation, diarrhea, nausea and vomiting.  Endocrine: Negative.   Genitourinary: Negative for dysuria and frequency.  Musculoskeletal: Negative for arthralgias, back pain, joint swelling and neck pain.  Skin: Negative for rash.  Allergic/Immunologic: Negative.   Neurological: Negative for tremors and numbness.  Hematological: Negative for adenopathy. Does not bruise/bleed easily.  Psychiatric/Behavioral: Negative for behavioral problems and sleep disturbance. The patient is not nervous/anxious.     Physical Exam Vitals and nursing note reviewed.  Constitutional:      General: She is not in acute distress.    Appearance: She is well-developed. She is not diaphoretic.  HENT:     Head: Normocephalic and atraumatic.     Mouth/Throat:     Pharynx: No oropharyngeal exudate.  Eyes:     Pupils: Pupils are equal, round, and reactive to light.  Neck:     Thyroid: No thyromegaly.     Vascular: No JVD.     Trachea: No tracheal deviation.  Cardiovascular:     Rate and Rhythm: Normal rate and regular rhythm.     Heart sounds: Normal heart sounds. No murmur. No friction rub. No gallop.   Pulmonary:     Effort: Pulmonary effort is normal. No respiratory distress.     Breath sounds: Normal breath sounds. No wheezing or rales.  Chest:     Chest wall: No tenderness.  Abdominal:     Palpations: Abdomen is soft.     Tenderness: There is no abdominal tenderness. There is no guarding.  Musculoskeletal:        General: Normal range of motion.     Cervical back: Normal range of motion and neck supple.  Lymphadenopathy:     Cervical: No cervical adenopathy.  Skin:    General: Skin is warm and dry.  Neurological:     Mental Status: She is alert and oriented to person, place, and time.     Cranial Nerves: No cranial nerve deficit.  Psychiatric:        Behavior: Behavior normal.         Thought Content: Thought content normal.        Judgment: Judgment normal.     Assessment/Plan: 1. Acute left ankle pain Pt will try continued RICE, and use prednisone as discussed. If no improvement will refer to Ortho for eval. - predniSONE (DELTASONE) 10 MG tablet; Use per dose pack  Dispense: 21 tablet; Refill: 0  2. B12 deficiency Replace B12 as discussed.   General Counseling: Lovelyn verbalizes understanding of the findings of todays visit and agrees with plan of treatment. I have discussed any further diagnostic evaluation that may be needed or ordered today. We also reviewed her medications today.  she has been encouraged to call the office with any questions or concerns that should arise related to todays visit.   No orders of the defined types were placed in this encounter.   No orders of the defined types were placed in this encounter.   Time spent: 25 Minutes  This patient was seen by Orson Gear AGNP-C in Collaboration with Dr Lavera Guise as a part of collaborative care agreement.  Kendell Bane AGNP-C Internal Medicine

## 2020-05-03 ENCOUNTER — Telehealth: Payer: Self-pay

## 2020-05-03 NOTE — Telephone Encounter (Signed)
Pt advised labs came back normal and potassium is little low eat banana and drink Gatorade

## 2020-05-16 DIAGNOSIS — G4733 Obstructive sleep apnea (adult) (pediatric): Secondary | ICD-10-CM | POA: Diagnosis not present

## 2020-05-21 ENCOUNTER — Other Ambulatory Visit: Payer: Self-pay | Admitting: Internal Medicine

## 2020-05-21 DIAGNOSIS — G894 Chronic pain syndrome: Secondary | ICD-10-CM | POA: Diagnosis not present

## 2020-05-25 ENCOUNTER — Other Ambulatory Visit: Payer: Self-pay

## 2020-05-26 ENCOUNTER — Other Ambulatory Visit: Payer: Self-pay

## 2020-05-26 DIAGNOSIS — E559 Vitamin D deficiency, unspecified: Secondary | ICD-10-CM

## 2020-05-26 MED ORDER — VITAMIN D (ERGOCALCIFEROL) 1.25 MG (50000 UNIT) PO CAPS: 50000.0000 [IU] | ORAL_CAPSULE | ORAL | 0 refills | Status: DC

## 2020-05-27 ENCOUNTER — Other Ambulatory Visit: Payer: Self-pay

## 2020-05-27 ENCOUNTER — Ambulatory Visit (INDEPENDENT_AMBULATORY_CARE_PROVIDER_SITE_OTHER): Payer: Medicare Other

## 2020-05-27 DIAGNOSIS — E538 Deficiency of other specified B group vitamins: Secondary | ICD-10-CM | POA: Diagnosis not present

## 2020-05-27 MED ORDER — CYANOCOBALAMIN 1000 MCG/ML IJ SOLN
1000.0000 ug | Freq: Once | INTRAMUSCULAR | Status: AC
  Administered 2020-05-27: 1000 ug via INTRAMUSCULAR

## 2020-05-28 ENCOUNTER — Other Ambulatory Visit: Payer: Self-pay | Admitting: Adult Health

## 2020-05-28 ENCOUNTER — Encounter: Payer: Self-pay | Admitting: Internal Medicine

## 2020-05-28 DIAGNOSIS — M25572 Pain in left ankle and joints of left foot: Secondary | ICD-10-CM

## 2020-05-28 NOTE — Progress Notes (Signed)
Pt continues to have left ankle/foot pain.  R.I.C.E. helped briefly.  X-ray negative.  Referral to podiatry.

## 2020-06-02 ENCOUNTER — Other Ambulatory Visit: Payer: Self-pay

## 2020-06-10 ENCOUNTER — Ambulatory Visit: Payer: Medicare Other | Admitting: Adult Health

## 2020-06-15 DIAGNOSIS — G4733 Obstructive sleep apnea (adult) (pediatric): Secondary | ICD-10-CM | POA: Diagnosis not present

## 2020-06-16 ENCOUNTER — Telehealth: Payer: Self-pay

## 2020-06-16 NOTE — Telephone Encounter (Signed)
Per patient will call back regarding missed appt on 06/10/20 for gap in care. Beth

## 2020-06-18 ENCOUNTER — Other Ambulatory Visit: Payer: Self-pay

## 2020-06-18 MED ORDER — AZITHROMYCIN 250 MG PO TABS: ORAL_TABLET | ORAL | 0 refills | Status: DC

## 2020-06-21 ENCOUNTER — Telehealth: Payer: Self-pay | Admitting: Adult Health

## 2020-06-22 ENCOUNTER — Other Ambulatory Visit: Payer: Self-pay

## 2020-06-22 DIAGNOSIS — M9904 Segmental and somatic dysfunction of sacral region: Secondary | ICD-10-CM | POA: Diagnosis not present

## 2020-06-22 DIAGNOSIS — M5441 Lumbago with sciatica, right side: Secondary | ICD-10-CM | POA: Diagnosis not present

## 2020-06-22 DIAGNOSIS — M9903 Segmental and somatic dysfunction of lumbar region: Secondary | ICD-10-CM | POA: Diagnosis not present

## 2020-06-22 DIAGNOSIS — M5442 Lumbago with sciatica, left side: Secondary | ICD-10-CM | POA: Diagnosis not present

## 2020-06-22 MED ORDER — AMOXICILLIN-POT CLAVULANATE 875-125 MG PO TABS: 1.0000 | ORAL_TABLET | Freq: Two times a day (BID) | ORAL | 0 refills | Status: DC

## 2020-06-24 ENCOUNTER — Ambulatory Visit: Payer: Medicare Other

## 2020-06-25 ENCOUNTER — Telehealth: Payer: Self-pay

## 2020-06-25 NOTE — Telephone Encounter (Signed)
Patient cancelled appointment on 06/28/2020 will call back to reschedule. klh

## 2020-06-28 ENCOUNTER — Ambulatory Visit: Payer: Medicare Other | Admitting: Internal Medicine

## 2020-06-30 ENCOUNTER — Other Ambulatory Visit: Payer: Self-pay

## 2020-06-30 MED ORDER — INSULIN PEN NEEDLE 31G X 5 MM MISC: 3 refills | Status: DC

## 2020-07-03 DIAGNOSIS — M545 Low back pain: Secondary | ICD-10-CM | POA: Diagnosis not present

## 2020-07-03 DIAGNOSIS — Z79899 Other long term (current) drug therapy: Secondary | ICD-10-CM | POA: Diagnosis not present

## 2020-07-03 DIAGNOSIS — G8929 Other chronic pain: Secondary | ICD-10-CM | POA: Diagnosis not present

## 2020-07-03 DIAGNOSIS — M25562 Pain in left knee: Secondary | ICD-10-CM | POA: Diagnosis not present

## 2020-07-03 DIAGNOSIS — M79672 Pain in left foot: Secondary | ICD-10-CM | POA: Diagnosis not present

## 2020-07-04 ENCOUNTER — Other Ambulatory Visit: Payer: Self-pay | Admitting: Adult Health

## 2020-07-06 ENCOUNTER — Ambulatory Visit (INDEPENDENT_AMBULATORY_CARE_PROVIDER_SITE_OTHER): Payer: Medicare Other | Admitting: Adult Health

## 2020-07-06 ENCOUNTER — Other Ambulatory Visit: Payer: Self-pay

## 2020-07-06 VITALS — BP 136/80 | HR 99 | Temp 97.6°F | Resp 16 | Ht 61.0 in | Wt 167.0 lb

## 2020-07-06 DIAGNOSIS — E538 Deficiency of other specified B group vitamins: Secondary | ICD-10-CM | POA: Diagnosis not present

## 2020-07-06 DIAGNOSIS — F339 Major depressive disorder, recurrent, unspecified: Secondary | ICD-10-CM

## 2020-07-06 DIAGNOSIS — R21 Rash and other nonspecific skin eruption: Secondary | ICD-10-CM

## 2020-07-06 DIAGNOSIS — E1165 Type 2 diabetes mellitus with hyperglycemia: Secondary | ICD-10-CM

## 2020-07-06 LAB — POCT GLYCOSYLATED HEMOGLOBIN (HGB A1C): Hemoglobin A1C: 9.7 % — AB (ref 4.0–5.6)

## 2020-07-06 MED ORDER — AMMONIUM LACTATE 12 % EX LOTN: 1.0000 "application " | TOPICAL_LOTION | CUTANEOUS | 0 refills | Status: AC | PRN

## 2020-07-06 MED ORDER — FLUOXETINE HCL 20 MG PO CAPS: 40.0000 mg | ORAL_CAPSULE | Freq: Every day | ORAL | 1 refills | Status: DC

## 2020-07-06 MED ORDER — CYANOCOBALAMIN 1000 MCG/ML IJ SOLN
1000.0000 ug | Freq: Once | INTRAMUSCULAR | Status: AC
  Administered 2020-07-06: 1000 ug via INTRAMUSCULAR

## 2020-07-06 NOTE — Progress Notes (Signed)
[facility] [place],  [id number]  Internal MEDICINE  Office Visit Note  Patient Name: [name] [name]  [id number]  [id number]  Date of Service: 07/06/2020  Chief Complaint  Patient presents with  . Follow-up    anxiety    HPI  Pt is here for follow up.  She has a lot going on right now.  Her daughter had weight loss surgery a week ago and has a serious infection at this time.  She also just received word that her grandmother in [place] is dying. She reports she is having lots of anxiety.  She is on Prozac 20mg  daily.  She sees pain mgmt and take multiple other narcotics at this time. Her blood sugars have been elevated.  She is taking farxiga and victoza.     Current Medication: Outpatient Encounter Medications as of 07/06/2020  Medication Sig  . ACCU-CHEK AVIVA PLUS test strip   . Accu-Chek Softclix Lancets lancets Use as instructed. DX E11.65  . albuterol (VENTOLIN HFA) 108 (90 Base) MCG/ACT inhaler Inhale into the lungs.  Marland Kitchen ammonium lactate (LAC-HYDRIN) 12 % lotion Apply 1 application topically as needed for dry skin.  Marland Kitchen amoxicillin-clavulanate (AUGMENTIN) 875-125 MG tablet Take 1 tablet by mouth 2 (two) times daily.  Marland Kitchen azithromycin (ZITHROMAX) 250 MG tablet Take as directed  . conjugated estrogens (PREMARIN) vaginal cream Place 1 Applicatorful vaginally daily. Twice a week  . cyclobenzaprine (FLEXERIL) 10 MG tablet Take 10 mg by mouth 3 (three) times daily as needed for muscle spasms.  . dapagliflozin propanediol (FARXIGA) 10 MG TABS tablet Take 10 mg by mouth daily.  Marland Kitchen FLUoxetine (PROZAC) 20 MG capsule TAKE 1 CAPSULE BY MOUTH EVERY DAY  . fluticasone (FLONASE) 50 MCG/ACT nasal spray Place 2 sprays into both nostrils daily.  . furosemide (LASIX) 20 MG tablet Take 20 mg by mouth. Take 1/2 tab as needed  . HYDROmorphone (DILAUDID) 4 MG tablet Take by mouth every 6 (six) hours as needed for severe pain.  . Insulin Pen Needle 31G X 5 MM MISC Use  as directed  Once a day Dx e11.65  . Ipratropium-Albuterol (COMBIVENT RESPIMAT) 20-100 MCG/ACT AERS respimat Inhale 1 puff into the lungs every 6 (six) hours.  Marland Kitchen levothyroxine (SYNTHROID) 50 MCG tablet Take 1 tablet (50 mcg total) by mouth daily.  . meloxicam (MOBIC) 15 MG tablet Take 15 mg by mouth daily.  . Methylnaltrexone Bromide (RELISTOR) 150 MG TABS Take 3 tablets by mouth.  . morphine (MS CONTIN) 60 MG 12 hr tablet Take 60 mg by mouth every 12 (twelve) hours.  . pantoprazole (PROTONIX) 40 MG tablet TAKE 1 TABLET BY MOUTH EVERY DAY  . pravastatin (PRAVACHOL) 10 MG tablet Take 1 tablet (10 mg total) by mouth daily.  . predniSONE (DELTASONE) 10 MG tablet Use per dose pack  . pregabalin (LYRICA) 150 MG capsule Take 150 mg by mouth 3 (three) times daily.   . promethazine (PHENERGAN) 25 MG tablet Take 1 tablet (25 mg total) by mouth every 6 (six) hours as needed for nausea or vomiting.  . sulfamethoxazole-trimethoprim (BACTRIM DS) 800-160 MG tablet Take 1 tablet by mouth 2 (two) times daily.  . SUMAtriptan (IMITREX) 100 MG tablet Take 100 mg by mouth every 2 (two) hours as needed for migraine. May repeat in 2 hours if headache persists or recurs.  Marland Kitchen VICTOZA 18 MG/3ML SOPN INJECT 1.8 MG UNDER THE SKIN ONCE DAILY  . Vitamin D, Ergocalciferol, (DRISDOL) 1.25 MG (50000 UNIT) CAPS  capsule Take 1 capsule (50,000 Units total) by mouth every 7 (seven) days.  . [EXPIRED] cyanocobalamin ((VITAMIN B-12)) injection 1,000 mcg    No facility-administered encounter medications on file as of 07/06/2020.    Surgical History: Past Surgical History:  Procedure Laterality Date  . ABDOMINAL HYSTERECTOMY    . CARPAL TUNNEL RELEASE    . KNEE SURGERY    . SPINAL FUSION    . SPINAL FUSION      Medical History: Past Medical History:  Diagnosis Date  . Actinic keratitis   . Allergy   . Anxiety   . Asthma   . Basal cell carcinoma   . Chronic venous insufficiency   . DDD (degenerative disc disease),  lumbar   . Diabetes mellitus without complication ([place])   . Fibromyalgia   . Fibromyalgia   . Hyperlipidemia   . Lymphedema   . Melanoma ([place])   . Migraine   . Mitral valve prolapse     Family History: Family History  Problem Relation Age of Onset  . Cancer Paternal Aunt   . Cancer Paternal Uncle   . Diabetes Paternal Grandfather   . Heart disease Paternal Grandfather     Social History   Socioeconomic History  . Marital status: Divorced    Spouse name: Not on file  . Number of children: Not on file  . Years of education: Not on file  . Highest education level: Not on file  Occupational History  . Not on file  Tobacco Use  . Smoking status: Never Smoker  . Smokeless tobacco: Never Used  Vaping Use  . Vaping Use: Never used  Substance and Sexual Activity  . Alcohol use: No  . Drug use: No  . Sexual activity: Not on file  Other Topics Concern  . Not on file  Social History Narrative  . Not on file   Social Determinants of Health   Financial Resource Strain:   . Difficulty of Paying Living Expenses:   Food Insecurity:   . Worried About Charity fundraiser in the Last Year:   . Arboriculturist in the Last Year:   Transportation Needs:   . Film/video editor (Medical):   Marland Kitchen Lack of Transportation (Non-Medical):   Physical Activity:   . Days of Exercise per Week:   . Minutes of Exercise per Session:   Stress:   . Feeling of Stress :   Social Connections:   . Frequency of Communication with Friends and Family:   . Frequency of Social Gatherings with Friends and Family:   . Attends Religious Services:   . Active Member of Clubs or Organizations:   . Attends Archivist Meetings:   Marland Kitchen Marital Status:   Intimate Partner Violence:   . Fear of Current or Ex-Partner:   . Emotionally Abused:   Marland Kitchen Physically Abused:   . Sexually Abused:       Review of Systems  Constitutional: Positive for fatigue. Negative for chills and unexpected weight  change.  HENT: Negative for congestion, rhinorrhea, sneezing and sore throat.   Eyes: Negative for photophobia, pain and redness.  Respiratory: Negative for cough, chest tightness and shortness of breath.   Cardiovascular: Negative for chest pain and palpitations.  Gastrointestinal: Negative for abdominal pain, constipation, diarrhea, nausea and vomiting.  Endocrine: Negative.   Genitourinary: Negative for dysuria and frequency.  Musculoskeletal: Negative for arthralgias, back pain, joint swelling and neck pain.  Skin: Negative for rash.  Allergic/Immunologic: Negative.  Neurological: Negative for tremors and numbness.  Hematological: Negative for adenopathy. Does not bruise/bleed easily.  Psychiatric/Behavioral: Positive for dysphoric mood and sleep disturbance. Negative for behavioral problems. The patient is nervous/anxious.     Vital Signs: BP (!) 136/80   Pulse 99   Temp 97.6 F (36.4 C)   Resp 16   Ht 5\' 1"  (1.549 m)   Wt 167 lb (75.8 kg)   LMP 12/13/1991   SpO2 96%   BMI 31.55 kg/m    Physical Exam Vitals and nursing note reviewed.  Constitutional:      General: She is not in acute distress.    Appearance: She is well-developed. She is not diaphoretic.  HENT:     Head: Normocephalic and atraumatic.     Mouth/Throat:     Pharynx: No oropharyngeal exudate.  Eyes:     Pupils: Pupils are equal, round, and reactive to light.  Neck:     Thyroid: No thyromegaly.     Vascular: No JVD.     Trachea: No tracheal deviation.  Cardiovascular:     Rate and Rhythm: Normal rate and regular rhythm.     Heart sounds: Normal heart sounds. No murmur heard.  No friction rub. No gallop.   Pulmonary:     Effort: Pulmonary effort is normal. No respiratory distress.     Breath sounds: Normal breath sounds. No wheezing or rales.  Chest:     Chest wall: No tenderness.  Abdominal:     Palpations: Abdomen is soft.     Tenderness: There is no abdominal tenderness. There is no  guarding.  Musculoskeletal:        General: Normal range of motion.     Cervical back: Normal range of motion and neck supple.  Lymphadenopathy:     Cervical: No cervical adenopathy.  Skin:    General: Skin is warm and dry.  Neurological:     Mental Status: She is alert and oriented to person, place, and time.     Cranial Nerves: No cranial nerve deficit.  Psychiatric:        Behavior: Behavior normal.        Thought Content: Thought content normal.        Judgment: Judgment normal.    Assessment/Plan: 1. Uncontrolled type 2 diabetes mellitus with hyperglycemia ([place]) Add Tujeo, start at 10 units daily, and increase by 2 units daily as long as blood sugar is over 150mg /dl for a max of 20 units.  - POCT glycosylated hemoglobin (Hb A1C)  2. B12 deficiency - cyanocobalamin ((VITAMIN B-12)) injection 1,000 mcg  3. Depression, recurrent ([place]) Stable, continue current management and medications.   4. Rash and nonspecific skin eruption - ammonium lactate (LAC-HYDRIN) 12 % lotion; Apply 1 application topically as needed for dry skin.  Dispense: 400 g; Refill: 0 General Counseling: Terica verbalizes understanding of the findings of todays visit and agrees with plan of treatment. I have discussed any further diagnostic evaluation that may be needed or ordered today. We also reviewed her medications today. she has been encouraged to call the office with any questions or concerns that should arise related to todays visit.    Orders Placed This Encounter  Procedures  . POCT glycosylated hemoglobin (Hb A1C)    Meds ordered this encounter  Medications  . cyanocobalamin ((VITAMIN B-12)) injection 1,000 mcg    Time spent: 30 Minutes   This patient was seen by Orson Gear AGNP-C in Collaboration with Dr Lavera Guise as a  part of collaborative care agreement     Kendell Bane AGNP-C Internal medicine

## 2020-07-14 ENCOUNTER — Other Ambulatory Visit: Payer: Self-pay | Admitting: Adult Health

## 2020-07-14 DIAGNOSIS — E559 Vitamin D deficiency, unspecified: Secondary | ICD-10-CM

## 2020-07-15 ENCOUNTER — Telehealth: Payer: Self-pay

## 2020-07-15 NOTE — Telephone Encounter (Signed)
Confirmed and screened for office visit on 8/9

## 2020-07-16 DIAGNOSIS — G4733 Obstructive sleep apnea (adult) (pediatric): Secondary | ICD-10-CM | POA: Diagnosis not present

## 2020-07-19 ENCOUNTER — Other Ambulatory Visit: Payer: Self-pay

## 2020-07-19 ENCOUNTER — Encounter: Payer: Self-pay | Admitting: Adult Health

## 2020-07-19 ENCOUNTER — Ambulatory Visit (INDEPENDENT_AMBULATORY_CARE_PROVIDER_SITE_OTHER): Payer: Medicare Other | Admitting: Adult Health

## 2020-07-19 VITALS — BP 104/65 | HR 87 | Temp 97.3°F | Resp 16 | Ht 61.0 in | Wt 165.6 lb

## 2020-07-19 DIAGNOSIS — F339 Major depressive disorder, recurrent, unspecified: Secondary | ICD-10-CM

## 2020-07-19 DIAGNOSIS — R5383 Other fatigue: Secondary | ICD-10-CM | POA: Diagnosis not present

## 2020-07-19 DIAGNOSIS — Z79899 Other long term (current) drug therapy: Secondary | ICD-10-CM | POA: Diagnosis not present

## 2020-07-19 DIAGNOSIS — E1165 Type 2 diabetes mellitus with hyperglycemia: Secondary | ICD-10-CM

## 2020-07-19 MED ORDER — FLUOXETINE HCL 20 MG PO CAPS: 40.0000 mg | ORAL_CAPSULE | Freq: Every day | ORAL | 1 refills | Status: DC

## 2020-07-19 MED ORDER — TOUJEO MAX SOLOSTAR 300 UNIT/ML ~~LOC~~ SOPN: 35.0000 [IU] | PEN_INJECTOR | Freq: Every day | SUBCUTANEOUS | 1 refills | Status: DC

## 2020-07-19 NOTE — Progress Notes (Signed)
[facility] [place], [place] [address]  Internal MEDICINE  Office Visit Note  Patient Name: [name] [name]  [id number]  [id number]  Date of Service: 07/19/2020  Chief Complaint  Patient presents with  . Follow-up    lumps on right hand   . Hyperlipidemia  . Diabetes  . Quality Metric Gaps    HIV screening    HPI  PT is here for follow up on DM, and anxiety.  She reports her anxiety is better controlled at this time. Her blood sugars have been between 120 and 150mg /dl.  She is up to 32 units of Toujeo daily. Her depression and anxiety are current controlled with no recent issues to report.     Current Medication: Outpatient Encounter Medications as of 07/19/2020  Medication Sig  . ACCU-CHEK AVIVA PLUS test strip   . Accu-Chek Softclix Lancets lancets Use as instructed. DX E11.65  . albuterol (VENTOLIN HFA) 108 (90 Base) MCG/ACT inhaler Inhale into the lungs.  Marland Kitchen ammonium lactate (LAC-HYDRIN) 12 % lotion Apply 1 application topically as needed for dry skin.  Marland Kitchen amoxicillin-clavulanate (AUGMENTIN) 875-125 MG tablet Take 1 tablet by mouth 2 (two) times daily.  Marland Kitchen azithromycin (ZITHROMAX) 250 MG tablet Take as directed  . conjugated estrogens (PREMARIN) vaginal cream Place 1 Applicatorful vaginally daily. Twice a week  . cyclobenzaprine (FLEXERIL) 10 MG tablet Take 10 mg by mouth 3 (three) times daily as needed for muscle spasms.  . dapagliflozin propanediol (FARXIGA) 10 MG TABS tablet Take 10 mg by mouth daily.  Marland Kitchen FLUoxetine (PROZAC) 20 MG capsule Take 2 capsules (40 mg total) by mouth daily.  . fluticasone (FLONASE) 50 MCG/ACT nasal spray Place 2 sprays into both nostrils daily.  . furosemide (LASIX) 20 MG tablet Take 20 mg by mouth. Take 1/2 tab as needed  . HYDROmorphone (DILAUDID) 4 MG tablet Take by mouth every 6 (six) hours as needed for severe pain.  . Insulin Pen Needle 31G X 5 MM MISC Use as directed  Once a day Dx e11.65  . Ipratropium-Albuterol  (COMBIVENT RESPIMAT) 20-100 MCG/ACT AERS respimat Inhale 1 puff into the lungs every 6 (six) hours.  Marland Kitchen levothyroxine (SYNTHROID) 50 MCG tablet Take 1 tablet (50 mcg total) by mouth daily.  . meloxicam (MOBIC) 15 MG tablet Take 15 mg by mouth daily.  . Methylnaltrexone Bromide (RELISTOR) 150 MG TABS Take 3 tablets by mouth.  . morphine (MS CONTIN) 60 MG 12 hr tablet Take 60 mg by mouth every 12 (twelve) hours.  . pantoprazole (PROTONIX) 40 MG tablet TAKE 1 TABLET BY MOUTH EVERY DAY  . pravastatin (PRAVACHOL) 10 MG tablet Take 1 tablet (10 mg total) by mouth daily.  . predniSONE (DELTASONE) 10 MG tablet Use per dose pack  . pregabalin (LYRICA) 150 MG capsule Take 150 mg by mouth 3 (three) times daily.   . promethazine (PHENERGAN) 25 MG tablet Take 1 tablet (25 mg total) by mouth every 6 (six) hours as needed for nausea or vomiting.  . sulfamethoxazole-trimethoprim (BACTRIM DS) 800-160 MG tablet Take 1 tablet by mouth 2 (two) times daily.  . SUMAtriptan (IMITREX) 100 MG tablet Take 100 mg by mouth every 2 (two) hours as needed for migraine. May repeat in 2 hours if headache persists or recurs.  Marland Kitchen VICTOZA 18 MG/3ML SOPN INJECT 1.8 MG UNDER THE SKIN ONCE DAILY  . Vitamin D, Ergocalciferol, (DRISDOL) 1.25 MG (50000 UNIT) CAPS capsule TAKE 1 CAPSULE (50,000 UNITS TOTAL) BY MOUTH EVERY 7 (SEVEN)  DAYS.   No facility-administered encounter medications on file as of 07/19/2020.    Surgical History: Past Surgical History:  Procedure Laterality Date  . ABDOMINAL HYSTERECTOMY    . CARPAL TUNNEL RELEASE    . KNEE SURGERY    . SPINAL FUSION    . SPINAL FUSION      Medical History: Past Medical History:  Diagnosis Date  . Actinic keratitis   . Allergy   . Anxiety   . Asthma   . Basal cell carcinoma   . Chronic venous insufficiency   . DDD (degenerative disc disease), lumbar   . Diabetes mellitus without complication ([place])   . Fibromyalgia   . Fibromyalgia   . Hyperlipidemia   . Lymphedema   .  Melanoma ([place])   . Migraine   . Mitral valve prolapse     Family History: Family History  Problem Relation Age of Onset  . Cancer Paternal Aunt   . Cancer Paternal Uncle   . Diabetes Paternal Grandfather   . Heart disease Paternal Grandfather     Social History   Socioeconomic History  . Marital status: Divorced    Spouse name: Not on file  . Number of children: Not on file  . Years of education: Not on file  . Highest education level: Not on file  Occupational History  . Not on file  Tobacco Use  . Smoking status: Never Smoker  . Smokeless tobacco: Never Used  Vaping Use  . Vaping Use: Never used  Substance and Sexual Activity  . Alcohol use: No  . Drug use: No  . Sexual activity: Not on file  Other Topics Concern  . Not on file  Social History Narrative  . Not on file   Social Determinants of Health   Financial Resource Strain:   . Difficulty of Paying Living Expenses:   Food Insecurity:   . Worried About Charity fundraiser in the Last Year:   . Arboriculturist in the Last Year:   Transportation Needs:   . Film/video editor (Medical):   Marland Kitchen Lack of Transportation (Non-Medical):   Physical Activity:   . Days of Exercise per Week:   . Minutes of Exercise per Session:   Stress:   . Feeling of Stress :   Social Connections:   . Frequency of Communication with Friends and Family:   . Frequency of Social Gatherings with Friends and Family:   . Attends Religious Services:   . Active Member of Clubs or Organizations:   . Attends Archivist Meetings:   Marland Kitchen Marital Status:   Intimate Partner Violence:   . Fear of Current or Ex-Partner:   . Emotionally Abused:   Marland Kitchen Physically Abused:   . Sexually Abused:       Review of Systems  Constitutional: Negative for chills, fatigue and unexpected weight change.  HENT: Negative for congestion, rhinorrhea, sneezing and sore throat.   Eyes: Negative for photophobia, pain and redness.  Respiratory:  Negative for cough, chest tightness and shortness of breath.   Cardiovascular: Negative for chest pain and palpitations.  Gastrointestinal: Negative for abdominal pain, constipation, diarrhea, nausea and vomiting.  Endocrine: Negative.   Genitourinary: Negative for dysuria and frequency.  Musculoskeletal: Negative for arthralgias, back pain, joint swelling and neck pain.  Skin: Negative for rash.  Allergic/Immunologic: Negative.   Neurological: Negative for tremors and numbness.  Hematological: Negative for adenopathy. Does not bruise/bleed easily.  Psychiatric/Behavioral: Negative for behavioral problems and  sleep disturbance. The patient is not nervous/anxious.     Vital Signs: BP 104/65   Pulse 87   Temp (!) 97.3 F (36.3 C)   Resp 16   Ht 5\' 1"  (1.549 m)   Wt 165 lb 9.6 oz (75.1 kg)   LMP 12/13/1991   SpO2 95%   BMI 31.29 kg/m    Physical Exam Vitals and nursing note reviewed.  Constitutional:      General: She is not in acute distress.    Appearance: She is well-developed. She is not diaphoretic.  HENT:     Head: Normocephalic and atraumatic.     Mouth/Throat:     Pharynx: No oropharyngeal exudate.  Eyes:     Pupils: Pupils are equal, round, and reactive to light.  Neck:     Thyroid: No thyromegaly.     Vascular: No JVD.     Trachea: No tracheal deviation.  Cardiovascular:     Rate and Rhythm: Normal rate and regular rhythm.     Heart sounds: Normal heart sounds. No murmur heard.  No friction rub. No gallop.   Pulmonary:     Effort: Pulmonary effort is normal. No respiratory distress.     Breath sounds: Normal breath sounds. No wheezing or rales.  Chest:     Chest wall: No tenderness.  Abdominal:     Palpations: Abdomen is soft.     Tenderness: There is no abdominal tenderness. There is no guarding.  Musculoskeletal:        General: Normal range of motion.     Cervical back: Normal range of motion and neck supple.  Lymphadenopathy:     Cervical: No  cervical adenopathy.  Skin:    General: Skin is warm and dry.  Neurological:     Mental Status: She is alert and oriented to person, place, and time.     Cranial Nerves: No cranial nerve deficit.  Psychiatric:        Behavior: Behavior normal.        Thought Content: Thought content normal.        Judgment: Judgment normal.    Assessment/Plan: 1. Uncontrolled type 2 diabetes mellitus with hyperglycemia ([facility]) Increase toujeo to 35 units daily, and follow up in 3 weeks.  Continues to encourage dietary modification.  - insulin glargine, 2 Unit Dial, (TOUJEO MAX SOLOSTAR) 300 UNIT/ML Solostar Pen; Inject 35 Units into the skin daily.  Dispense: 12 mL; Refill: 1  2. Long-term use of high-risk medication - CBC with Differential/Platelet - Lipid Panel With LDL/HDL Ratio - TSH - T4, free - Comprehensive metabolic panel  3. Depression, recurrent ([place]) Use prozac as directed.  - FLUoxetine (PROZAC) 20 MG capsule; Take 2 capsules (40 mg total) by mouth daily.  Dispense: 60 capsule; Refill: 1  4. Other fatigue - B12 and Folate Panel - Vitamin D (25 hydroxy) - Fe+TIBC+Fer  General Counseling: Angeliyah verbalizes understanding of the findings of todays visit and agrees with plan of treatment. I have discussed any further diagnostic evaluation that may be needed or ordered today. We also reviewed her medications today. she has been encouraged to call the office with any questions or concerns that should arise related to todays visit.    No orders of the defined types were placed in this encounter.   No orders of the defined types were placed in this encounter.   Time spent: 30 Minutes   This patient was seen by Orson Gear AGNP-C in Collaboration with Dr Timoteo Gaul  Humphrey Rolls as a part of collaborative care agreement     Kendell Bane AGNP-C Internal medicine

## 2020-07-21 ENCOUNTER — Ambulatory Visit: Payer: Medicare Other

## 2020-07-27 ENCOUNTER — Encounter: Payer: Self-pay | Admitting: Internal Medicine

## 2020-07-27 NOTE — Telephone Encounter (Signed)
Hey. This message came to me, but I am unfamiliar with her.

## 2020-07-29 DIAGNOSIS — G8929 Other chronic pain: Secondary | ICD-10-CM | POA: Diagnosis not present

## 2020-07-29 DIAGNOSIS — M545 Low back pain: Secondary | ICD-10-CM | POA: Diagnosis not present

## 2020-07-29 DIAGNOSIS — Z79899 Other long term (current) drug therapy: Secondary | ICD-10-CM | POA: Diagnosis not present

## 2020-07-29 DIAGNOSIS — M25562 Pain in left knee: Secondary | ICD-10-CM | POA: Diagnosis not present

## 2020-07-30 DIAGNOSIS — G4733 Obstructive sleep apnea (adult) (pediatric): Secondary | ICD-10-CM | POA: Diagnosis not present

## 2020-08-09 ENCOUNTER — Telehealth: Payer: Self-pay

## 2020-08-09 NOTE — Telephone Encounter (Signed)
Pt called that she exposure to covid she like  To go test first and then pt is called back for appt we can make video appt

## 2020-08-11 ENCOUNTER — Other Ambulatory Visit: Payer: Self-pay | Admitting: Internal Medicine

## 2020-08-11 ENCOUNTER — Ambulatory Visit: Payer: Medicare Other

## 2020-08-11 DIAGNOSIS — F339 Major depressive disorder, recurrent, unspecified: Secondary | ICD-10-CM

## 2020-08-16 DIAGNOSIS — G4733 Obstructive sleep apnea (adult) (pediatric): Secondary | ICD-10-CM | POA: Diagnosis not present

## 2020-08-18 ENCOUNTER — Telehealth: Payer: Self-pay

## 2020-08-26 ENCOUNTER — Other Ambulatory Visit: Payer: Self-pay | Admitting: Adult Health

## 2020-08-27 DIAGNOSIS — G8929 Other chronic pain: Secondary | ICD-10-CM | POA: Diagnosis not present

## 2020-08-27 DIAGNOSIS — M545 Low back pain: Secondary | ICD-10-CM | POA: Diagnosis not present

## 2020-08-27 DIAGNOSIS — Z79899 Other long term (current) drug therapy: Secondary | ICD-10-CM | POA: Diagnosis not present

## 2020-08-27 DIAGNOSIS — M25562 Pain in left knee: Secondary | ICD-10-CM | POA: Diagnosis not present

## 2020-09-07 ENCOUNTER — Encounter: Payer: Self-pay | Admitting: Podiatry

## 2020-09-07 ENCOUNTER — Ambulatory Visit: Payer: Medicare Other | Admitting: Podiatry

## 2020-09-07 ENCOUNTER — Other Ambulatory Visit: Payer: Self-pay

## 2020-09-07 DIAGNOSIS — R5383 Other fatigue: Secondary | ICD-10-CM | POA: Diagnosis not present

## 2020-09-07 DIAGNOSIS — M7662 Achilles tendinitis, left leg: Secondary | ICD-10-CM

## 2020-09-07 DIAGNOSIS — E559 Vitamin D deficiency, unspecified: Secondary | ICD-10-CM | POA: Diagnosis not present

## 2020-09-07 DIAGNOSIS — D529 Folate deficiency anemia, unspecified: Secondary | ICD-10-CM | POA: Diagnosis not present

## 2020-09-07 DIAGNOSIS — R790 Abnormal level of blood mineral: Secondary | ICD-10-CM | POA: Diagnosis not present

## 2020-09-07 DIAGNOSIS — D510 Vitamin B12 deficiency anemia due to intrinsic factor deficiency: Secondary | ICD-10-CM | POA: Diagnosis not present

## 2020-09-08 ENCOUNTER — Ambulatory Visit (INDEPENDENT_AMBULATORY_CARE_PROVIDER_SITE_OTHER): Payer: Medicare Other

## 2020-09-08 ENCOUNTER — Encounter: Payer: Self-pay | Admitting: Podiatry

## 2020-09-08 DIAGNOSIS — G4733 Obstructive sleep apnea (adult) (pediatric): Secondary | ICD-10-CM | POA: Diagnosis not present

## 2020-09-08 NOTE — Progress Notes (Signed)
Subjective:  Patient ID: [name] [name], female    DOB: 10-25-1976,  MRN: [id number]  Chief Complaint  Patient presents with  . Ankle Pain    Patient presents today for left ankle/achilles pain and swelling since May    44 y.o. female presents with the above complaint.  Patient presents with complaint of left posterior tendinitis/Achilles pain.  Patient states this has been going on since last May.  Patient states her left ankle swells up and there is been a lot of pain as well as tightness.  It tends to hurt in the morning especially getting out of bed.  There is shooting pain bruising pain burning pain tender to touch.  No injuries.  She states pain scale 7 out of 10.  She has tried icing it heating soaking Epson salt Ace bandage but none of that has helped.  She would like to discuss various treatment options.  She has not seen anyone else prior to seeing me.  She had x-rays done in epic.   Review of Systems: Negative except as noted in the HPI. Denies N/V/F/Ch.  Past Medical History:  Diagnosis Date  . Actinic keratitis   . Allergy   . Anxiety   . Asthma   . Basal cell carcinoma   . Chronic venous insufficiency   . DDD (degenerative disc disease), lumbar   . Diabetes mellitus without complication ([place])   . Fibromyalgia   . Fibromyalgia   . Hyperlipidemia   . Lymphedema   . Melanoma ([place])   . Migraine   . Mitral valve prolapse     Current Outpatient Medications:  .  cetirizine (ZYRTEC) 10 MG tablet, Take by mouth., Disp: , Rfl:  .  Docusate Sodium (DSS) 100 MG CAPS, , Disp: , Rfl:  .  naloxegol oxalate (MOVANTIK) 12.5 MG TABS tablet, TAKE 1 TABLET(S) BY MOUTH DAILY ON EMPTY STOMACH 1 HOUR BEFORE OR 2 HOURS AFTER FIRST MEAL., Disp: , Rfl:  .  triamcinolone ointment (KENALOG) 0.1 %, Apply to affected area daily for two weeks, Disp: , Rfl:  .  ACCU-CHEK AVIVA PLUS test strip, , Disp: , Rfl:  .  Accu-Chek Softclix Lancets lancets, Use as instructed. DX E11.65, Disp: 100  each, Rfl: 3 .  albuterol (VENTOLIN HFA) 108 (90 Base) MCG/ACT inhaler, Inhale into the lungs., Disp: , Rfl:  .  ammonium lactate (LAC-HYDRIN) 12 % lotion, Apply 1 application topically as needed for dry skin., Disp: 400 g, Rfl: 0 .  Cholecalciferol 25 MCG (1000 UT) tablet, Take by mouth., Disp: , Rfl:  .  conjugated estrogens (PREMARIN) vaginal cream, Place 1 Applicatorful vaginally daily. Twice a week, Disp: 90 g, Rfl: 1 .  cyclobenzaprine (FLEXERIL) 10 MG tablet, Take 10 mg by mouth 3 (three) times daily as needed for muscle spasms., Disp: , Rfl:  .  FARXIGA 10 MG TABS tablet, TAKE 1 TABLET BY MOUTH EVERY DAY, Disp: 30 tablet, Rfl: 5 .  fenofibrate (TRICOR) 145 MG tablet, Take by mouth., Disp: , Rfl:  .  FLUoxetine (PROZAC) 20 MG capsule, TAKE 1 CAPSULE BY MOUTH EVERY DAY, Disp: 90 capsule, Rfl: 1 .  fluticasone (FLONASE) 50 MCG/ACT nasal spray, Place 2 sprays into both nostrils daily., Disp: , Rfl:  .  furosemide (LASIX) 20 MG tablet, Take 20 mg by mouth. Take 1/2 tab as needed, Disp: , Rfl:  .  HYDROmorphone (DILAUDID) 4 MG tablet, Take by mouth every 6 (six) hours as needed for severe pain., Disp: , Rfl:  .  insulin glargine, 2 Unit Dial, (TOUJEO MAX SOLOSTAR) 300 UNIT/ML Solostar Pen, Inject 35 Units into the skin daily., Disp: 12 mL, Rfl: 1 .  Insulin Pen Needle 31G X 5 MM MISC, Use as directed  Once a day Dx e11.65, Disp: 100 each, Rfl: 3 .  Ipratropium-Albuterol (COMBIVENT RESPIMAT) 20-100 MCG/ACT AERS respimat, Inhale 1 puff into the lungs every 6 (six) hours., Disp: , Rfl:  .  levothyroxine (SYNTHROID) 50 MCG tablet, Take 1 tablet (50 mcg total) by mouth daily., Disp: 90 tablet, Rfl: 3 .  meloxicam (MOBIC) 15 MG tablet, Take 15 mg by mouth daily., Disp: , Rfl:  .  Methylnaltrexone Bromide (RELISTOR) 150 MG TABS, Take 3 tablets by mouth., Disp: , Rfl:  .  morphine (MS CONTIN) 60 MG 12 hr tablet, Take 60 mg by mouth every 12 (twelve) hours., Disp: , Rfl:  .  Multiple Vitamin  (MULTI-VITAMIN) tablet, Take 1 tablet by mouth daily., Disp: , Rfl:  .  pantoprazole (PROTONIX) 40 MG tablet, TAKE 1 TABLET BY MOUTH EVERY DAY, Disp: 90 tablet, Rfl: 1 .  pravastatin (PRAVACHOL) 10 MG tablet, Take 1 tablet (10 mg total) by mouth daily., Disp: 90 tablet, Rfl: 2 .  pregabalin (LYRICA) 150 MG capsule, Take 150 mg by mouth 3 (three) times daily. , Disp: , Rfl:  .  promethazine (PHENERGAN) 25 MG tablet, Take 1 tablet (25 mg total) by mouth every 6 (six) hours as needed for nausea or vomiting., Disp: 30 tablet, Rfl: 0 .  SUMAtriptan (IMITREX) 100 MG tablet, Take 100 mg by mouth every 2 (two) hours as needed for migraine. May repeat in 2 hours if headache persists or recurs., Disp: , Rfl:  .  VICTOZA 18 MG/3ML SOPN, INJECT 1.8 MG UNDER THE SKIN ONCE DAILY, Disp: 3 pen, Rfl: 3 .  Vitamin D, Ergocalciferol, (DRISDOL) 1.25 MG (50000 UNIT) CAPS capsule, TAKE 1 CAPSULE (50,000 UNITS TOTAL) BY MOUTH EVERY 7 (SEVEN) DAYS., Disp: 8 capsule, Rfl: 0  Social History   Tobacco Use  Smoking Status Never Smoker  Smokeless Tobacco Never Used    Allergies  Allergen Reactions  . Oxycodone Hcl Nausea And Vomiting  . Pollen Extract Other (See Comments)    Congestion, sneezing Congestion, sneezing   . Sulfa Antibiotics Other (See Comments)  . Topiramate Other (See Comments)    Jerking  Jerking  Jerking  Other reaction(s): Other (See Comments) Jerking    . Bee Venom Hives, Itching and Swelling  . Crestor [Rosuvastatin] Other (See Comments)    Muscle spasms  . Doxycycline Nausea Only  . Oxycontin [Oxycodone] Nausea Only   Objective:  There were no vitals filed for this visit. There is no height or weight on file to calculate BMI. Constitutional Well developed. Well nourished.  Vascular Dorsalis pedis pulses palpable bilaterally. Posterior tibial pulses palpable bilaterally. Capillary refill normal to all digits.  No cyanosis or clubbing noted. Pedal hair growth normal.  Neurologic  Normal speech. Oriented to person, place, and time. Epicritic sensation to light touch grossly present bilaterally.  Dermatologic Nails well groomed and normal in appearance. No open wounds. No skin lesions.  Orthopedic:  Pain on palpation to the posterior heel at the level of the insertion of the Achilles tendon.  Pain with dorsiflexion of the ankle joint no pain with plantarflexion of the ankle joint.  No pain at the ATFL, peroneal tendon, posterior tibial tendon.   Radiographs: Previous x-rays were reviewed and last March Stable dorsal navicular spurring/fused ossification center or  old fracture fragment. Mildly progressive calcaneal enthesophyte formation. Unfused ossicle adjacent to the distal medial malleolus. Diffuse soft tissue swelling. No visible effusion. No fracture or dislocation. Assessment:   1. Achilles tendinitis, left leg    Plan:  Patient was evaluated and treated and all questions answered.  Left Achilles tendinitis -I explained patient the etiology of tendinitis and various treatment options were discussed.  Given the amount of pain that she is having I believe she will benefit from cam boot immobilization.  I discussed this with the patient extensive detail that she agrees with the plan. -Cam boot was dispensed for immobilization. -If there is no improvement we will discuss MRI versus steroid injection.  No follow-ups on file.

## 2020-09-08 NOTE — Progress Notes (Signed)
95 percentile pressure 8   95th percentile leak 12.0   apnea index 3.0 /hr  apnea-hypopnea index  3.1 /hr   total days used  >4 hr 68 days  total days used <4 hr 13 days  Total compliance 76 percent  Doing good on cpap wearing much more states cant sleep without now. No problems or questions at this time

## 2020-09-10 LAB — B12 AND FOLATE PANEL
Folate: 9.2 ng/mL (ref >3.0)
Vitamin B-12: 396 pg/mL (ref 232–1245)

## 2020-09-10 LAB — IRON,TIBC AND FERRITIN PANEL
Ferritin: 17 ng/mL (ref 15–150)
Iron Saturation: 20 % (ref 15–55)
Iron: 62 ug/dL (ref 27–159)
Total Iron Binding Capacity: 315 ug/dL (ref 250–450)
UIBC: 253 ug/dL (ref 131–425)

## 2020-09-10 LAB — VITAMIN D 25 HYDROXY (VIT D DEFICIENCY, FRACTURES): Vit D, 25-Hydroxy: 37.7 ng/mL (ref 30.0–100.0)

## 2020-09-14 ENCOUNTER — Ambulatory Visit: Payer: Medicare Other | Admitting: Internal Medicine

## 2020-09-15 DIAGNOSIS — G4733 Obstructive sleep apnea (adult) (pediatric): Secondary | ICD-10-CM | POA: Diagnosis not present

## 2020-09-16 ENCOUNTER — Ambulatory Visit: Payer: Medicare Other | Admitting: Internal Medicine

## 2020-09-25 ENCOUNTER — Other Ambulatory Visit: Payer: Self-pay | Admitting: Internal Medicine

## 2020-09-27 DIAGNOSIS — Z79899 Other long term (current) drug therapy: Secondary | ICD-10-CM | POA: Diagnosis not present

## 2020-09-27 DIAGNOSIS — M25562 Pain in left knee: Secondary | ICD-10-CM | POA: Diagnosis not present

## 2020-09-27 DIAGNOSIS — G8929 Other chronic pain: Secondary | ICD-10-CM | POA: Diagnosis not present

## 2020-09-27 DIAGNOSIS — M545 Low back pain, unspecified: Secondary | ICD-10-CM | POA: Diagnosis not present

## 2020-09-29 ENCOUNTER — Other Ambulatory Visit: Payer: Self-pay

## 2020-09-29 DIAGNOSIS — Z20822 Contact with and (suspected) exposure to covid-19: Secondary | ICD-10-CM | POA: Diagnosis not present

## 2020-09-29 MED ORDER — AMOXICILLIN-POT CLAVULANATE 875-125 MG PO TABS: 1.0000 | ORAL_TABLET | Freq: Two times a day (BID) | ORAL | 0 refills | Status: DC

## 2020-09-29 NOTE — Telephone Encounter (Signed)
Pt called that she having sinus infection ,no fever ,bodyaches and runny nose as per adam send Augmentin and also advised pt to go for covid test she is going today and going to call us back

## 2020-10-04 ENCOUNTER — Ambulatory Visit (INDEPENDENT_AMBULATORY_CARE_PROVIDER_SITE_OTHER): Payer: Medicare Other | Admitting: Nurse Practitioner

## 2020-10-04 ENCOUNTER — Encounter: Payer: Self-pay | Admitting: Nurse Practitioner

## 2020-10-04 VITALS — BP 120/82 | HR 90 | Temp 97.4°F | Resp 16 | Ht 61.0 in | Wt 175.6 lb

## 2020-10-04 DIAGNOSIS — M064 Inflammatory polyarthropathy: Secondary | ICD-10-CM

## 2020-10-04 DIAGNOSIS — R300 Dysuria: Secondary | ICD-10-CM | POA: Diagnosis not present

## 2020-10-04 DIAGNOSIS — R5383 Other fatigue: Secondary | ICD-10-CM | POA: Diagnosis not present

## 2020-10-04 DIAGNOSIS — E1165 Type 2 diabetes mellitus with hyperglycemia: Secondary | ICD-10-CM

## 2020-10-04 DIAGNOSIS — Z0001 Encounter for general adult medical examination with abnormal findings: Secondary | ICD-10-CM

## 2020-10-04 DIAGNOSIS — Z1231 Encounter for screening mammogram for malignant neoplasm of breast: Secondary | ICD-10-CM

## 2020-10-04 DIAGNOSIS — E538 Deficiency of other specified B group vitamins: Secondary | ICD-10-CM

## 2020-10-04 MED ORDER — CYANOCOBALAMIN 1000 MCG/ML IJ SOLN
1000.0000 ug | Freq: Once | INTRAMUSCULAR | Status: AC
  Administered 2020-10-04: 1000 ug via INTRAMUSCULAR

## 2020-10-04 NOTE — Progress Notes (Signed)
[facility] [place], [place] [address]  Internal MEDICINE  Office Visit Note  Patient Name: [name] [name]  [id number]  [id number]  Date of Service: 10/24/2020   Pt is here for routine health maintenance examination  Chief Complaint  Patient presents with  . Medicare Wellness  . Diabetes  . Anxiety  . Hyperlipidemia  . Asthma  . policy update form    received  . Quality Metric Gaps    pneumovax     Patient is here for health maintenance exam. . Blood sugars are doing well.  Has generalized joint pain. Due to have routine, fasting labs Due to have mammogram.     Current Medication: Outpatient Encounter Medications as of 10/04/2020  Medication Sig  . ACCU-CHEK AVIVA PLUS test strip   . Accu-Chek Softclix Lancets lancets Use as instructed. DX E11.65  . albuterol (VENTOLIN HFA) 108 (90 Base) MCG/ACT inhaler Inhale into the lungs.  Marland Kitchen ammonium lactate (LAC-HYDRIN) 12 % lotion Apply 1 application topically as needed for dry skin.  Marland Kitchen amoxicillin-clavulanate (AUGMENTIN) 875-125 MG tablet Take 1 tablet by mouth 2 (two) times daily.  . cetirizine (ZYRTEC) 10 MG tablet Take by mouth.  . Cholecalciferol 25 MCG (1000 UT) tablet Take by mouth.  . conjugated estrogens (PREMARIN) vaginal cream Place 1 Applicatorful vaginally daily. Twice a week  . cyclobenzaprine (FLEXERIL) 10 MG tablet Take 10 mg by mouth 3 (three) times daily as needed for muscle spasms.  Marland Kitchen FARXIGA 10 MG TABS tablet TAKE 1 TABLET BY MOUTH EVERY DAY  . fenofibrate (TRICOR) 145 MG tablet Take by mouth.  Marland Kitchen FLUoxetine (PROZAC) 20 MG capsule TAKE 1 CAPSULE BY MOUTH EVERY DAY  . fluticasone (FLONASE) 50 MCG/ACT nasal spray Place 2 sprays into both nostrils daily.  . furosemide (LASIX) 20 MG tablet Take 20 mg by mouth. Take 1/2 tab as needed  . HYDROmorphone (DILAUDID) 4 MG tablet Take by mouth every 6 (six) hours as needed for severe pain.  Marland Kitchen insulin glargine, 2 Unit Dial, (TOUJEO MAX  SOLOSTAR) 300 UNIT/ML Solostar Pen Inject 35 Units into the skin daily.  . Insulin Pen Needle 31G X 5 MM MISC Use as directed  Once a day Dx e11.65  . Ipratropium-Albuterol (COMBIVENT RESPIMAT) 20-100 MCG/ACT AERS respimat Inhale 1 puff into the lungs every 6 (six) hours.  Marland Kitchen levothyroxine (SYNTHROID) 50 MCG tablet TAKE 1 TABLET BY MOUTH EVERY DAY  . meloxicam (MOBIC) 15 MG tablet Take 15 mg by mouth daily.  . Methylnaltrexone Bromide (RELISTOR) 150 MG TABS Take 3 tablets by mouth.  . morphine (MS CONTIN) 60 MG 12 hr tablet Take 60 mg by mouth every 12 (twelve) hours.  . Multiple Vitamin (MULTI-VITAMIN) tablet Take 1 tablet by mouth daily.  . pantoprazole (PROTONIX) 40 MG tablet TAKE 1 TABLET BY MOUTH EVERY DAY  . pravastatin (PRAVACHOL) 10 MG tablet Take 1 tablet (10 mg total) by mouth daily.  . pregabalin (LYRICA) 150 MG capsule Take 150 mg by mouth 3 (three) times daily.   . promethazine (PHENERGAN) 25 MG tablet Take 1 tablet (25 mg total) by mouth every 6 (six) hours as needed for nausea or vomiting.  . SUMAtriptan (IMITREX) 100 MG tablet Take 100 mg by mouth every 2 (two) hours as needed for migraine. May repeat in 2 hours if headache persists or recurs.  . triamcinolone ointment (KENALOG) 0.1 % Apply to affected area daily for two weeks  . VICTOZA 18 MG/3ML SOPN INJECT 1.8 MG UNDER  THE SKIN ONCE DAILY  . Vitamin D, Ergocalciferol, (DRISDOL) 1.25 MG (50000 UNIT) CAPS capsule TAKE 1 CAPSULE (50,000 UNITS TOTAL) BY MOUTH EVERY 7 (SEVEN) DAYS.  Mariane Baumgarten Sodium (DSS) 100 MG CAPS  (Patient not taking: Reported on 10/04/2020)  . naloxegol oxalate (MOVANTIK) 12.5 MG TABS tablet TAKE 1 TABLET(S) BY MOUTH DAILY ON EMPTY STOMACH 1 HOUR BEFORE OR 2 HOURS AFTER FIRST MEAL. (Patient not taking: Reported on 10/04/2020)  . [EXPIRED] cyanocobalamin ((VITAMIN B-12)) injection 1,000 mcg    No facility-administered encounter medications on file as of 10/04/2020.    Surgical History: Past Surgical History:   Procedure Laterality Date  . ABDOMINAL HYSTERECTOMY    . CARPAL TUNNEL RELEASE    . KNEE SURGERY    . SPINAL FUSION    . SPINAL FUSION      Medical History: Past Medical History:  Diagnosis Date  . Actinic keratitis   . Allergy   . Anxiety   . Asthma   . Basal cell carcinoma   . Chronic venous insufficiency   . DDD (degenerative disc disease), lumbar   . Diabetes mellitus without complication ([place])   . Fibromyalgia   . Fibromyalgia   . Hyperlipidemia   . Lymphedema   . Melanoma ([place])   . Migraine   . Mitral valve prolapse   . Tendonitis of foot    left    Family History: Family History  Problem Relation Age of Onset  . Cancer Paternal Aunt   . Cancer Paternal Uncle   . Diabetes Paternal Grandfather   . Heart disease Paternal Grandfather       Review of Systems  Constitutional: Positive for fatigue. Negative for activity change, chills and unexpected weight change.  HENT: Negative for congestion, postnasal drip, rhinorrhea, sneezing and sore throat.   Respiratory: Negative for cough, chest tightness and shortness of breath.   Cardiovascular: Negative for chest pain and palpitations.  Gastrointestinal: Negative for abdominal pain, constipation, diarrhea, nausea and vomiting.  Endocrine: Negative for cold intolerance, heat intolerance, polydipsia and polyuria.       Blood sugars doing well   Genitourinary: Negative for dysuria and frequency.  Musculoskeletal: Positive for arthralgias and myalgias. Negative for back pain, joint swelling and neck pain.       Patient having generalized joint tenderness  Skin: Negative for rash.  Allergic/Immunologic: Positive for environmental allergies.  Neurological: Negative for dizziness, tremors, numbness and headaches.  Hematological: Negative for adenopathy. Does not bruise/bleed easily.  Psychiatric/Behavioral: Positive for dysphoric mood. Negative for behavioral problems (Depression), sleep disturbance and suicidal ideas.  The patient is nervous/anxious.        Well managed with current medication.     Today's Vitals   10/04/20 1534  BP: 120/82  Pulse: 90  Resp: 16  Temp: (!) 97.4 F (36.3 C)  SpO2: 98%  Weight: 175 lb 9.6 oz (79.7 kg)  Height: 5\' 1"  (1.549 m)   Body mass index is 33.18 kg/m.  Physical Exam Vitals and nursing note reviewed.  Constitutional:      General: She is not in acute distress.    Appearance: Normal appearance. She is well-developed. She is not diaphoretic.  HENT:     Head: Normocephalic and atraumatic.     Mouth/Throat:     Mouth: Mucous membranes are moist.     Pharynx: No oropharyngeal exudate.  Eyes:     Pupils: Pupils are equal, round, and reactive to light.  Neck:     Thyroid: No  thyromegaly.     Vascular: No carotid bruit or JVD.     Trachea: No tracheal deviation.  Cardiovascular:     Rate and Rhythm: Normal rate and regular rhythm.     Pulses:          Dorsalis pedis pulses are 1+ on the right side and 1+ on the left side.       Posterior tibial pulses are 1+ on the right side and 1+ on the left side.     Heart sounds: Normal heart sounds. No murmur heard.  No friction rub. No gallop.   Pulmonary:     Effort: Pulmonary effort is normal. No respiratory distress.     Breath sounds: Normal breath sounds. No wheezing or rales.  Chest:     Chest wall: No tenderness.  Abdominal:     General: Bowel sounds are normal.     Palpations: Abdomen is soft.     Tenderness: There is no abdominal tenderness.  Musculoskeletal:        General: Normal range of motion.     Cervical back: Normal range of motion and neck supple.     Right foot: Normal range of motion. No deformity or bunion.     Left foot: Normal range of motion. No deformity or bunion.     Comments: generalized joint pain without point tenderness present.   Feet:     Right foot:     Protective Sensation: 10 sites tested. 10 sites sensed.     Skin integrity: Skin integrity normal.     Toenail  Condition: Right toenails are normal.     Left foot:     Protective Sensation: 10 sites tested. 10 sites sensed.     Skin integrity: Skin integrity normal.     Toenail Condition: Left toenails are normal.  Lymphadenopathy:     Cervical: No cervical adenopathy.  Skin:    General: Skin is warm and dry.     Capillary Refill: Capillary refill takes less than 2 seconds.  Neurological:     General: No focal deficit present.     Mental Status: She is alert and oriented to person, place, and time.     Cranial Nerves: No cranial nerve deficit.  Psychiatric:        Mood and Affect: Mood normal.        Behavior: Behavior normal.        Thought Content: Thought content normal.        Judgment: Judgment normal.    Depression screen [facility] 2/9 10/04/2020 07/19/2020 07/19/2020 09/01/2019 07/22/2019  Decreased Interest 3 1 0 0 0  Down, Depressed, Hopeless 3 1 0 0 0  PHQ - 2 Score 6 2 0 0 0  Altered sleeping 3 - - - -  Tired, decreased energy 3 - - - -  Change in appetite 3 - - - -  Feeling bad or failure about yourself  0 - - - -  Trouble concentrating 0 - - - -  Moving slowly or fidgety/restless 0 - - - -  Suicidal thoughts 0 - - - -  PHQ-9 Score 15 - - - -    Functional Status Survey: Is the patient deaf or have difficulty hearing?: No Does the patient have difficulty seeing, even when wearing glasses/contacts?: No Does the patient have difficulty concentrating, remembering, or making decisions?: No Does the patient have difficulty walking or climbing stairs?: No Does the patient have difficulty dressing or bathing?: No Does the  patient have difficulty doing errands alone such as visiting a doctor's office or shopping?: No  MMSE - [place] Exam 10/04/2020  Orientation to time 5  Orientation to Place 5  Registration 3  Attention/ Calculation 5  Recall 3  Language- name 2 objects 2  Language- repeat 1  Language- follow 3 step command 3  Language- read & follow direction 1  Write  a sentence 1  Copy design 1  Total score 30    Fall Risk  10/04/2020 07/19/2020 09/01/2019 07/22/2019  Falls in the past year? 0 0 0 0  Number falls in past yr: - 0 0 0  Injury with Fall? - - 0 0  Risk for fall due to : No Fall Risks - - -  Follow up Falls evaluation completed - - -      LABS: Recent Results (from the past 2160 hour(s))  B12 and Folate Panel     Status: None   Collection Time: 09/07/20  2:55 PM  Result Value Ref Range   Vitamin B-12 396 232 - 1,245 pg/mL   Folate 9.2 >3.0 ng/mL    Comment: A serum folate concentration of less than 3.1 ng/mL is considered to represent clinical deficiency.   Vitamin D (25 hydroxy)     Status: None   Collection Time: 09/07/20  2:55 PM  Result Value Ref Range   Vit D, 25-Hydroxy 37.7 30.0 - 100.0 ng/mL    Comment: Vitamin D deficiency has been defined by the [place] practice guideline as a level of serum 25-OH vitamin D less than 20 ng/mL (1,2). The Endocrine Society went on to further define vitamin D insufficiency as a level between 21 and 29 ng/mL (2). 1. IOM (Institute of Medicine). 2010. Dietary reference    intakes for calcium and D. [place]: The    [employer]. 2. Holick MF, Binkley Langdon, Bischoff-Ferrari HA, et al.    Evaluation, treatment, and prevention of vitamin D    deficiency: an Endocrine Society clinical practice    guideline. JCEM. 2011 Jul; 96(7):1911-30.   Fe+TIBC+Fer     Status: None   Collection Time: 09/07/20  2:55 PM  Result Value Ref Range   Total Iron Binding Capacity 315 250 - 450 ug/dL   UIBC 253 131 - 425 ug/dL   Iron 62 27 - 159 ug/dL   Iron Saturation 20 15 - 55 %   Ferritin 17 15.0 - 150.0 ng/mL  UA/M w/rflx Culture, Routine     Status: Abnormal   Collection Time: 10/04/20  3:54 PM   Specimen: Urine   Urine  Result Value Ref Range   Specific Gravity, UA      >=1.030 (A) 1.005 - 1.030   pH, UA 5.0 5.0 - 7.5   Color, UA Yellow Yellow    Appearance Ur Clear Clear   Leukocytes,UA Negative Negative   Protein,UA Negative Negative/Trace   Glucose, UA 3+ (A) Negative   Ketones, UA Negative Negative   RBC, UA Negative Negative   Bilirubin, UA Negative Negative   Urobilinogen, Ur 0.2 0.2 - 1.0 mg/dL   Nitrite, UA Negative Negative   Microscopic Examination Comment     Comment: Microscopic follows if indicated.   Microscopic Examination See below:     Comment: Microscopic was indicated and was performed.   Urinalysis Reflex Comment     Comment: This specimen will not reflex to a Urine Culture.  Microscopic Examination  Status: Abnormal   Collection Time: 10/04/20  3:54 PM   Urine  Result Value Ref Range   WBC, UA None seen 0 - 5 /hpf   RBC None seen 0 - 2 /hpf   Epithelial Cells (non renal) 0-10 0 - 10 /hpf   Casts None seen None seen /lpf   Bacteria, UA None seen None seen/Few   Yeast, UA Present (A) None seen  CBC     Status: None   Collection Time: 10/07/20  3:01 PM  Result Value Ref Range   WBC 5.0 3.4 - 10.8 x10E3/uL   RBC 4.75 3.77 - 5.28 x10E6/uL   Hemoglobin 13.6 11.1 - 15.9 g/dL   Hematocrit 41.8 34.0 - 46.6 %   MCV 88 79 - 97 fL   MCH 28.6 26.6 - 33.0 pg   MCHC 32.5 31 - 35 g/dL   RDW 12.4 11.7 - 15.4 %   Platelets 295 150 - 450 x10E3/uL  T4, free     Status: None   Collection Time: 10/07/20  3:01 PM  Result Value Ref Range   Free T4 0.94 0.82 - 1.77 ng/dL  TSH     Status: None   Collection Time: 10/07/20  3:01 PM  Result Value Ref Range   TSH 1.130 0.450 - 4.500 uIU/mL  Rheumatoid factor     Status: None   Collection Time: 10/07/20  3:01 PM  Result Value Ref Range   Rhuematoid fact SerPl-aCnc <10.0 0.0 - 13.9 IU/mL  ANA     Status: None   Collection Time: 10/07/20  3:01 PM  Result Value Ref Range   Anti Nuclear Antibody (ANA) Negative Negative  Sedimentation rate     Status: None   Collection Time: 10/07/20  3:01 PM  Result Value Ref Range   Sed Rate 14 0 - 32 mm/hr     Assessment/Plan: 1. Encounter for general adult medical examination with abnormal findings Annual health maintenance exam today. Order slip given to have routine, fasting labs.   2. Uncontrolled type 2 diabetes mellitus with hyperglycemia (HCC) - POCT HgB A1C  3. Other fatigue Check labs, include thyroid, anemia, ad connective tissue panels.   4. Inflammatory polyarthropathy (HCC) Check connective tissue panel.   5. B12 deficiency b12 injection administered today.  - cyanocobalamin ((VITAMIN B-12)) injection 1,000 mcg  6. Encounter for screening mammogram for malignant neoplasm of breast - MM DIGITAL SCREENING BILATERAL; Future  7. Dysuria - UA/M w/rflx Culture, Routine  General Counseling: Keyarra verbalizes understanding of the findings of todays visit and agrees with plan of treatment. I have discussed any further diagnostic evaluation that may be needed or ordered today. We also reviewed her medications today. she has been encouraged to call the office with any questions or concerns that should arise related to todays visit.    Counseling:  This patient was seen by Leretha Pol FNP Collaboration with Dr Lavera Guise as a part of collaborative care agreement  Orders Placed This Encounter  Procedures  . Microscopic Examination  . MM DIGITAL SCREENING BILATERAL  . UA/M w/rflx Culture, Routine  . POCT HgB A1C    Meds ordered this encounter  Medications  . cyanocobalamin ((VITAMIN B-12)) injection 1,000 mcg    Total time spent: 44 Minutes  Time spent includes review of chart, medications, test results, and follow up plan with the patient.     Lavera Guise, MD  Internal Medicine

## 2020-10-05 ENCOUNTER — Ambulatory Visit: Payer: Medicare Other | Admitting: Internal Medicine

## 2020-10-05 LAB — UA/M W/RFLX CULTURE, ROUTINE
Bilirubin, UA: NEGATIVE
Ketones, UA: NEGATIVE
Leukocytes,UA: NEGATIVE
Nitrite, UA: NEGATIVE
Protein,UA: NEGATIVE
RBC, UA: NEGATIVE
Specific Gravity, UA: >=1.03 — AB (ref 1.005–1.030)
Urobilinogen, Ur: 0.2 mg/dL (ref 0.2–1.0)
pH, UA: 5 (ref 5.0–7.5)

## 2020-10-05 LAB — MICROSCOPIC EXAMINATION
Bacteria, UA: NONE SEEN
Casts: NONE SEEN /lpf
RBC, Urine: NONE SEEN /hpf (ref 0–2)
WBC, UA: NONE SEEN /hpf (ref 0–5)

## 2020-10-05 NOTE — Progress Notes (Signed)
+   Yeast infection

## 2020-10-07 ENCOUNTER — Other Ambulatory Visit: Payer: Self-pay | Admitting: Nurse Practitioner

## 2020-10-07 ENCOUNTER — Ambulatory Visit: Payer: Medicare Other | Admitting: Podiatry

## 2020-10-07 ENCOUNTER — Other Ambulatory Visit: Payer: Self-pay

## 2020-10-07 ENCOUNTER — Ambulatory Visit (INDEPENDENT_AMBULATORY_CARE_PROVIDER_SITE_OTHER): Payer: Medicare Other

## 2020-10-07 ENCOUNTER — Encounter: Payer: Self-pay | Admitting: Podiatry

## 2020-10-07 DIAGNOSIS — M7662 Achilles tendinitis, left leg: Secondary | ICD-10-CM | POA: Diagnosis not present

## 2020-10-07 DIAGNOSIS — E538 Deficiency of other specified B group vitamins: Secondary | ICD-10-CM | POA: Diagnosis not present

## 2020-10-07 DIAGNOSIS — M7661 Achilles tendinitis, right leg: Secondary | ICD-10-CM | POA: Diagnosis not present

## 2020-10-07 DIAGNOSIS — R5383 Other fatigue: Secondary | ICD-10-CM | POA: Diagnosis not present

## 2020-10-07 DIAGNOSIS — M064 Inflammatory polyarthropathy: Secondary | ICD-10-CM | POA: Diagnosis not present

## 2020-10-08 ENCOUNTER — Other Ambulatory Visit: Payer: Self-pay | Admitting: Nurse Practitioner

## 2020-10-08 ENCOUNTER — Other Ambulatory Visit: Payer: Self-pay

## 2020-10-08 ENCOUNTER — Encounter: Payer: Self-pay | Admitting: Podiatry

## 2020-10-08 DIAGNOSIS — B3731 Acute candidiasis of vulva and vagina: Secondary | ICD-10-CM

## 2020-10-08 DIAGNOSIS — B373 Candidiasis of vulva and vagina: Secondary | ICD-10-CM

## 2020-10-08 LAB — RHEUMATOID FACTOR: Rheumatoid fact SerPl-aCnc: <10 IU/mL (ref 0.0–13.9)

## 2020-10-08 LAB — TSH: TSH: 1.13 u[IU]/mL (ref 0.450–4.500)

## 2020-10-08 LAB — CBC
Hematocrit: 41.8 % (ref 34.0–46.6)
Hemoglobin: 13.6 g/dL (ref 11.1–15.9)
MCH: 28.6 pg (ref 26.6–33.0)
MCHC: 32.5 g/dL (ref 31.5–35.7)
MCV: 88 fL (ref 79–97)
Platelets: 295 10*3/uL (ref 150–450)
RBC: 4.75 x10E6/uL (ref 3.77–5.28)
RDW: 12.4 % (ref 11.7–15.4)
WBC: 5 10*3/uL (ref 3.4–10.8)

## 2020-10-08 LAB — T4, FREE: Free T4: 0.94 ng/dL (ref 0.82–1.77)

## 2020-10-08 LAB — SEDIMENTATION RATE: Sed Rate: 14 mm/hr (ref 0–32)

## 2020-10-08 LAB — ANA: Anti Nuclear Antibody (ANA): NEGATIVE

## 2020-10-08 MED ORDER — FLUCONAZOLE 150 MG PO TABS: ORAL_TABLET | ORAL | 0 refills | Status: DC

## 2020-10-08 NOTE — Progress Notes (Signed)
Subjective:  Patient ID: [name] [name], female    DOB: [birth year],  MRN: [id number]  Chief Complaint  Patient presents with  . Tendonitis    "my left foot is doing better, but when I was walking down the stairs 2 days ago, I felt a pop in the back of my right heel and it sent me to my knees"    44 y.o. female presents with the above complaint.  Patient presents with a follow-up of left posterior tibial tendinitis/Achilles tendinitis.  Patient states the left side is doing much better.  She has had about 70 to 80% improvement.  She still has residual pain.  However she injured the right side when going/walking down the stairs 2 days ago felt a pop in the back of the heel.  She states that there is a lot of pain to the right hip.  She denies any other acute complaints.  She would like to discuss treatment options for the right side now.   Review of Systems: Negative except as noted in the HPI. Denies N/V/F/Ch.  Past Medical History:  Diagnosis Date  . Actinic keratitis   . Allergy   . Anxiety   . Asthma   . Basal cell carcinoma   . Chronic venous insufficiency   . DDD (degenerative disc disease), lumbar   . Diabetes mellitus without complication ([place])   . Fibromyalgia   . Fibromyalgia   . Hyperlipidemia   . Lymphedema   . Melanoma ([place])   . Migraine   . Mitral valve prolapse   . Tendonitis of foot    left    Current Outpatient Medications:  .  ACCU-CHEK AVIVA PLUS test strip, , Disp: , Rfl:  .  Accu-Chek Softclix Lancets lancets, Use as instructed. DX E11.65, Disp: 100 each, Rfl: 3 .  albuterol (VENTOLIN HFA) 108 (90 Base) MCG/ACT inhaler, Inhale into the lungs., Disp: , Rfl:  .  ammonium lactate (LAC-HYDRIN) 12 % lotion, Apply 1 application topically as needed for dry skin., Disp: 400 g, Rfl: 0 .  amoxicillin-clavulanate (AUGMENTIN) 875-125 MG tablet, Take 1 tablet by mouth 2 (two) times daily., Disp: 20 tablet, Rfl: 0 .  cetirizine (ZYRTEC) 10 MG tablet, Take by  mouth., Disp: , Rfl:  .  Cholecalciferol 25 MCG (1000 UT) tablet, Take by mouth., Disp: , Rfl:  .  conjugated estrogens (PREMARIN) vaginal cream, Place 1 Applicatorful vaginally daily. Twice a week, Disp: 90 g, Rfl: 1 .  cyclobenzaprine (FLEXERIL) 10 MG tablet, Take 10 mg by mouth 3 (three) times daily as needed for muscle spasms., Disp: , Rfl:  .  Docusate Sodium (DSS) 100 MG CAPS, , Disp: , Rfl:  .  FARXIGA 10 MG TABS tablet, TAKE 1 TABLET BY MOUTH EVERY DAY, Disp: 30 tablet, Rfl: 5 .  fenofibrate (TRICOR) 145 MG tablet, Take by mouth., Disp: , Rfl:  .  FLUoxetine (PROZAC) 20 MG capsule, TAKE 1 CAPSULE BY MOUTH EVERY DAY, Disp: 90 capsule, Rfl: 1 .  fluticasone (FLONASE) 50 MCG/ACT nasal spray, Place 2 sprays into both nostrils daily., Disp: , Rfl:  .  furosemide (LASIX) 20 MG tablet, Take 20 mg by mouth. Take 1/2 tab as needed, Disp: , Rfl:  .  HYDROmorphone (DILAUDID) 4 MG tablet, Take by mouth every 6 (six) hours as needed for severe pain., Disp: , Rfl:  .  insulin glargine, 2 Unit Dial, (TOUJEO MAX SOLOSTAR) 300 UNIT/ML Solostar Pen, Inject 35 Units into the skin daily., Disp: 12 mL, Rfl:  1 .  Insulin Pen Needle 31G X 5 MM MISC, Use as directed  Once a day Dx e11.65, Disp: 100 each, Rfl: 3 .  Ipratropium-Albuterol (COMBIVENT RESPIMAT) 20-100 MCG/ACT AERS respimat, Inhale 1 puff into the lungs every 6 (six) hours., Disp: , Rfl:  .  levothyroxine (SYNTHROID) 50 MCG tablet, TAKE 1 TABLET BY MOUTH EVERY DAY, Disp: 90 tablet, Rfl: 3 .  meloxicam (MOBIC) 15 MG tablet, Take 15 mg by mouth daily., Disp: , Rfl:  .  Methylnaltrexone Bromide (RELISTOR) 150 MG TABS, Take 3 tablets by mouth., Disp: , Rfl:  .  morphine (MS CONTIN) 60 MG 12 hr tablet, Take 60 mg by mouth every 12 (twelve) hours., Disp: , Rfl:  .  Multiple Vitamin (MULTI-VITAMIN) tablet, Take 1 tablet by mouth daily., Disp: , Rfl:  .  naloxegol oxalate (MOVANTIK) 12.5 MG TABS tablet, TAKE 1 TABLET(S) BY MOUTH DAILY ON EMPTY STOMACH 1 HOUR  BEFORE OR 2 HOURS AFTER FIRST MEAL. (Patient not taking: Reported on 10/04/2020), Disp: , Rfl:  .  pantoprazole (PROTONIX) 40 MG tablet, TAKE 1 TABLET BY MOUTH EVERY DAY, Disp: 90 tablet, Rfl: 1 .  pravastatin (PRAVACHOL) 10 MG tablet, Take 1 tablet (10 mg total) by mouth daily., Disp: 90 tablet, Rfl: 2 .  pregabalin (LYRICA) 150 MG capsule, Take 150 mg by mouth 3 (three) times daily. , Disp: , Rfl:  .  promethazine (PHENERGAN) 25 MG tablet, Take 1 tablet (25 mg total) by mouth every 6 (six) hours as needed for nausea or vomiting., Disp: 30 tablet, Rfl: 0 .  SUMAtriptan (IMITREX) 100 MG tablet, Take 100 mg by mouth every 2 (two) hours as needed for migraine. May repeat in 2 hours if headache persists or recurs., Disp: , Rfl:  .  triamcinolone ointment (KENALOG) 0.1 %, Apply to affected area daily for two weeks, Disp: , Rfl:  .  VICTOZA 18 MG/3ML SOPN, INJECT 1.8 MG UNDER THE SKIN ONCE DAILY, Disp: 3 pen, Rfl: 3 .  Vitamin D, Ergocalciferol, (DRISDOL) 1.25 MG (50000 UNIT) CAPS capsule, TAKE 1 CAPSULE (50,000 UNITS TOTAL) BY MOUTH EVERY 7 (SEVEN) DAYS., Disp: 8 capsule, Rfl: 0  Social History   Tobacco Use  Smoking Status Never Smoker  Smokeless Tobacco Never Used    Allergies  Allergen Reactions  . Oxycodone Hcl Nausea And Vomiting  . Pollen Extract Other (See Comments)    Congestion, sneezing Congestion, sneezing   . Sulfa Antibiotics Other (See Comments)  . Topiramate Other (See Comments)    Jerking  Jerking  Jerking  Other reaction(s): Other (See Comments) Jerking    . Bee Venom Hives, Itching and Swelling  . Crestor [Rosuvastatin] Other (See Comments)    Muscle spasms  . Doxycycline Nausea Only  . Oxycontin [Oxycodone] Nausea Only   Objective:  There were no vitals filed for this visit. There is no height or weight on file to calculate BMI. Constitutional Well developed. Well nourished.  Vascular Dorsalis pedis pulses palpable bilaterally. Posterior tibial pulses  palpable bilaterally. Capillary refill normal to all digits.  No cyanosis or clubbing noted. Pedal hair growth normal.  Neurologic Normal speech. Oriented to person, place, and time. Epicritic sensation to light touch grossly present bilaterally.  Dermatologic Nails well groomed and normal in appearance. No open wounds. No skin lesions.  Orthopedic:  Mild pain on palpation to the posterior heel at the level of the insertion of the Achilles tendon.  Pain with dorsiflexion of the ankle joint no pain with plantarflexion  of the ankle joint.  No pain at the ATFL, peroneal tendon, posterior tibial tendon.  Pain on palpation to the posterior heel at the level of the insertion of the Achilles tendon.  Pain with dorsiflexion of the ankle joint.  No pain with plantarflexion of the ankle joint.  No pain at the ATFL, peroneal tendon, posterior tibial tendon.  No intra-articular pain of the extremity noted.   Radiographs: Previous x-rays were reviewed and last March Stable dorsal navicular spurring/fused ossification center or old fracture fragment. Mildly progressive calcaneal enthesophyte formation. Unfused ossicle adjacent to the distal medial malleolus. Diffuse soft tissue swelling. No visible effusion. No fracture or Dislocation.  Right ankle x-ray: No osseous abnormalities noted.  No obliteration of the Kager's fat pad noted.  No avulsion of the calcaneus noted.  No fractures noted.  Posterior heel spurring noted. Assessment:   1. Achilles tendinitis of right lower extremity   2. Achilles tendinitis, left leg    Plan:  Patient was evaluated and treated and all questions answered.  Left Achilles tendinitis -Clinically the pain in the Achilles tendon is improving.  The cam boot immobilization worked tremendously.  However she still has some residual pain this present and therefore I believe patient will benefit from a steroid injection to the right lower extremity help decrease the acute  inflammatory component associated with pain.  I also discussed with her the risk of tendon rupture associated.  Patient would like to proceed despite the risks. -A steroid injection was performed at left Kager's fat pad using 1% plain Lidocaine and 10 mg of Kenalog. This was well tolerated. -Tri-Lock ankle brace was dispensed  Right Achilles tendinitis '-I explained patient the etiology of Achilles tendinitis and various treatment options were discussed.  I believe that she may have also injured the right side as well.  I believe patient will benefit from cam boot immobilization to allow the soft tissue and tendons to heal.  Her tendon is intact and no signs of ruptures are associated with it.  She still has good plantarflexion strength. No follow-ups on file.

## 2020-10-08 NOTE — Progress Notes (Signed)
Please let the patient know that her urine sample at physical showed evidence of yeast infection. I have sent diflucan to [facility] [address]. She should take this once. If she has symptoms of yeast infection after that, she can repeat dose in three days. Thanks.

## 2020-10-11 ENCOUNTER — Telehealth: Payer: Self-pay

## 2020-10-11 NOTE — Telephone Encounter (Signed)
-----   Message from Ronnell Freshwater, NP sent at 10/08/2020  1:55 PM EDT ----- Please let the patient know that her urine sample at physical showed evidence of yeast infection. I have sent diflucan to [facility] [address]. She should take this once. If she has symptoms of yeast infection after that, she can repeat dose in three  days. Thanks.

## 2020-10-11 NOTE — Telephone Encounter (Signed)
Pt.notified

## 2020-10-16 DIAGNOSIS — G4733 Obstructive sleep apnea (adult) (pediatric): Secondary | ICD-10-CM | POA: Diagnosis not present

## 2020-10-19 ENCOUNTER — Telehealth: Payer: Self-pay

## 2020-10-20 ENCOUNTER — Telehealth: Payer: Self-pay

## 2020-10-20 NOTE — Telephone Encounter (Signed)
Pt called and was informed lab results were normal

## 2020-10-20 NOTE — Telephone Encounter (Signed)
Pt.notified

## 2020-10-20 NOTE — Telephone Encounter (Signed)
Looked like all labs were normal. Thanks.

## 2020-10-20 NOTE — Progress Notes (Signed)
Labs normal. Discuss in detail at visit 11/08/2020

## 2020-10-21 ENCOUNTER — Other Ambulatory Visit: Payer: Self-pay | Admitting: Podiatry

## 2020-10-21 ENCOUNTER — Ambulatory Visit: Payer: Medicare Other | Admitting: Podiatry

## 2020-10-21 ENCOUNTER — Other Ambulatory Visit: Payer: Self-pay

## 2020-10-21 ENCOUNTER — Ambulatory Visit (INDEPENDENT_AMBULATORY_CARE_PROVIDER_SITE_OTHER): Payer: Medicare Other

## 2020-10-21 ENCOUNTER — Encounter: Payer: Self-pay | Admitting: Podiatry

## 2020-10-21 DIAGNOSIS — M7661 Achilles tendinitis, right leg: Secondary | ICD-10-CM

## 2020-10-21 DIAGNOSIS — S86011A Strain of right Achilles tendon, initial encounter: Secondary | ICD-10-CM | POA: Diagnosis not present

## 2020-10-22 ENCOUNTER — Ambulatory Visit
Admission: RE | Admit: 2020-10-22 | Discharge: 2020-10-22 | Disposition: A | Discharge status: Home / Self Care | Payer: Medicare Other | Source: Ambulatory Visit | Attending: Podiatry | Admitting: Podiatry

## 2020-10-22 ENCOUNTER — Encounter: Payer: Self-pay | Admitting: Podiatry

## 2020-10-22 DIAGNOSIS — S86011A Strain of right Achilles tendon, initial encounter: Secondary | ICD-10-CM | POA: Diagnosis not present

## 2020-10-22 DIAGNOSIS — M25471 Effusion, right ankle: Secondary | ICD-10-CM | POA: Diagnosis not present

## 2020-10-22 DIAGNOSIS — R531 Weakness: Secondary | ICD-10-CM | POA: Diagnosis not present

## 2020-10-22 DIAGNOSIS — R600 Localized edema: Secondary | ICD-10-CM | POA: Diagnosis not present

## 2020-10-22 NOTE — Progress Notes (Signed)
Subjective:  Patient ID: [name] [name], female    DOB: 1976/10/02,  MRN: [id number]  Chief Complaint  Patient presents with  . Ankle Pain    "I was walking my dog last Wednesday and she ran up and hit the back of my right leg at my ankle, and it bruised, swelled and was very painful."    44 y.o. female presents with the above complaint.  Patient presents with complaint of right leg pain that has been bothering her for very long time.  Patient states is bruised swollen very painful.  She states that she was walking her dog and the dog ran up to her and hit her in the back of the leg.  She states that she thinks that he may have torn the Achilles tendon.  She has not been able to plantarflex.  Is very painful.  She placed herself in the boot that she had for the other side.  She denies any other acute complaints.  She would like to discuss treatment options.  She has not seen anyone else prior to seeing me for this particular thing.   Review of Systems: Negative except as noted in the HPI. Denies N/V/F/Ch.  Past Medical History:  Diagnosis Date  . Actinic keratitis   . Allergy   . Anxiety   . Asthma   . Basal cell carcinoma   . Chronic venous insufficiency   . DDD (degenerative disc disease), lumbar   . Diabetes mellitus without complication ([place])   . Fibromyalgia   . Fibromyalgia   . Hyperlipidemia   . Lymphedema   . Melanoma ([place])   . Migraine   . Mitral valve prolapse   . Tendonitis of foot    left    Current Outpatient Medications:  .  ACCU-CHEK AVIVA PLUS test strip, , Disp: , Rfl:  .  Accu-Chek Softclix Lancets lancets, Use as instructed. DX E11.65, Disp: 100 each, Rfl: 3 .  albuterol (VENTOLIN HFA) 108 (90 Base) MCG/ACT inhaler, Inhale into the lungs., Disp: , Rfl:  .  ammonium lactate (LAC-HYDRIN) 12 % lotion, Apply 1 application topically as needed for dry skin., Disp: 400 g, Rfl: 0 .  amoxicillin-clavulanate (AUGMENTIN) 875-125 MG tablet, Take 1 tablet by mouth  2 (two) times daily., Disp: 20 tablet, Rfl: 0 .  cetirizine (ZYRTEC) 10 MG tablet, Take by mouth., Disp: , Rfl:  .  Cholecalciferol 25 MCG (1000 UT) tablet, Take by mouth., Disp: , Rfl:  .  conjugated estrogens (PREMARIN) vaginal cream, Place 1 Applicatorful vaginally daily. Twice a week, Disp: 90 g, Rfl: 1 .  cyclobenzaprine (FLEXERIL) 10 MG tablet, Take 10 mg by mouth 3 (three) times daily as needed for muscle spasms., Disp: , Rfl:  .  Docusate Sodium (DSS) 100 MG CAPS, , Disp: , Rfl:  .  FARXIGA 10 MG TABS tablet, TAKE 1 TABLET BY MOUTH EVERY DAY, Disp: 30 tablet, Rfl: 5 .  fenofibrate (TRICOR) 145 MG tablet, Take by mouth., Disp: , Rfl:  .  fluconazole (DIFLUCAN) 150 MG tablet, Take 1 tablet po once. May repeat dose in 3 days as needed for persistent symptoms., Disp: 3 tablet, Rfl: 0 .  FLUoxetine (PROZAC) 20 MG capsule, TAKE 1 CAPSULE BY MOUTH EVERY DAY, Disp: 90 capsule, Rfl: 1 .  fluticasone (FLONASE) 50 MCG/ACT nasal spray, Place 2 sprays into both nostrils daily., Disp: , Rfl:  .  furosemide (LASIX) 20 MG tablet, Take 20 mg by mouth. Take 1/2 tab as needed, Disp: ,  Rfl:  .  HYDROmorphone (DILAUDID) 4 MG tablet, Take by mouth every 6 (six) hours as needed for severe pain., Disp: , Rfl:  .  insulin glargine, 2 Unit Dial, (TOUJEO MAX SOLOSTAR) 300 UNIT/ML Solostar Pen, Inject 35 Units into the skin daily., Disp: 12 mL, Rfl: 1 .  Insulin Pen Needle 31G X 5 MM MISC, Use as directed  Once a day Dx e11.65, Disp: 100 each, Rfl: 3 .  Ipratropium-Albuterol (COMBIVENT RESPIMAT) 20-100 MCG/ACT AERS respimat, Inhale 1 puff into the lungs every 6 (six) hours., Disp: , Rfl:  .  levothyroxine (SYNTHROID) 50 MCG tablet, TAKE 1 TABLET BY MOUTH EVERY DAY, Disp: 90 tablet, Rfl: 3 .  meloxicam (MOBIC) 15 MG tablet, Take 15 mg by mouth daily., Disp: , Rfl:  .  Methylnaltrexone Bromide (RELISTOR) 150 MG TABS, Take 3 tablets by mouth., Disp: , Rfl:  .  morphine (MS CONTIN) 60 MG 12 hr tablet, Take 60 mg by mouth  every 12 (twelve) hours., Disp: , Rfl:  .  Multiple Vitamin (MULTI-VITAMIN) tablet, Take 1 tablet by mouth daily., Disp: , Rfl:  .  naloxegol oxalate (MOVANTIK) 12.5 MG TABS tablet, TAKE 1 TABLET(S) BY MOUTH DAILY ON EMPTY STOMACH 1 HOUR BEFORE OR 2 HOURS AFTER FIRST MEAL. (Patient not taking: Reported on 10/04/2020), Disp: , Rfl:  .  pantoprazole (PROTONIX) 40 MG tablet, TAKE 1 TABLET BY MOUTH EVERY DAY, Disp: 90 tablet, Rfl: 1 .  pravastatin (PRAVACHOL) 10 MG tablet, Take 1 tablet (10 mg total) by mouth daily., Disp: 90 tablet, Rfl: 2 .  pregabalin (LYRICA) 150 MG capsule, Take 150 mg by mouth 3 (three) times daily. , Disp: , Rfl:  .  promethazine (PHENERGAN) 25 MG tablet, Take 1 tablet (25 mg total) by mouth every 6 (six) hours as needed for nausea or vomiting., Disp: 30 tablet, Rfl: 0 .  SUMAtriptan (IMITREX) 100 MG tablet, Take 100 mg by mouth every 2 (two) hours as needed for migraine. May repeat in 2 hours if headache persists or recurs., Disp: , Rfl:  .  triamcinolone ointment (KENALOG) 0.1 %, Apply to affected area daily for two weeks, Disp: , Rfl:  .  VICTOZA 18 MG/3ML SOPN, INJECT 1.8 MG UNDER THE SKIN ONCE DAILY, Disp: 3 pen, Rfl: 3 .  Vitamin D, Ergocalciferol, (DRISDOL) 1.25 MG (50000 UNIT) CAPS capsule, TAKE 1 CAPSULE (50,000 UNITS TOTAL) BY MOUTH EVERY 7 (SEVEN) DAYS., Disp: 8 capsule, Rfl: 0  Social History   Tobacco Use  Smoking Status Never Smoker  Smokeless Tobacco Never Used    Allergies  Allergen Reactions  . Oxycodone Hcl Nausea And Vomiting  . Pollen Extract Other (See Comments)    Congestion, sneezing Congestion, sneezing   . Sulfa Antibiotics Other (See Comments)  . Topiramate Other (See Comments)    Jerking  Jerking  Jerking  Other reaction(s): Other (See Comments) Jerking    . Bee Venom Hives, Itching and Swelling  . Crestor [Rosuvastatin] Other (See Comments)    Muscle spasms  . Doxycycline Nausea Only  . Oxycontin [Oxycodone] Nausea Only    Objective:  There were no vitals filed for this visit. There is no height or weight on file to calculate BMI. Constitutional Well developed. Well nourished.  Vascular Dorsalis pedis pulses palpable bilaterally. Posterior tibial pulses palpable bilaterally. Capillary refill normal to all digits.  No cyanosis or clubbing noted. Pedal hair growth normal.  Neurologic Normal speech. Oriented to person, place, and time. Epicritic sensation to light touch grossly  present bilaterally.  Dermatologic Nails well groomed and normal in appearance. No open wounds. No skin lesions.  Orthopedic:  Pain on palpation along the course of the Achilles tendon.  Hatchet strike defect defect noted to the posterior Achilles tendon.  No continuity or palpable cord noted of the Achilles tendon.  Unable to plantarflex of the foot with squeezing of the calf.  0 out of 5 strength plantar flexion.  5 out of 5's ankle joint strength of the dorsiflexion.  No active and passive range of motion noted of the plantar flexion of the ankle  Positive Silfverskiold test with gastrocnemius equinus as well noted prior to Achilles tendon rupture   Radiographs: 3 views of skeletally mature adult right foot: No osseous abnormalities noted no fractures noted. No arthritic changes noted.  Obliteration of the fat pad noted. Assessment:   1. Achilles tendinitis of right lower extremity   2. Achilles tendon rupture, right, initial encounter    Plan:  Patient was evaluated and treated and all questions answered.  right acute Achilles tendon rupture -Patient presents with acute Achilles tendon rupture clinically I cannot.  I can feel the palpable defect in the back of the heel cord.  Patient has no strength in the plantarflexion of the ankle joint.  At this time I believe he I have asked the patient be completely nonweightbearing with a cam boot on.  She states understanding she will be nonweightbearing.  She has knee scooter and  walker to help maintain with that as well.   -I would also like to evaluate the get in the depth of the rupture and therefore I believe she will benefit from an MRI evaluation.  MRI was ordered -Patient will need a surgical intervention for primary repair of the Achilles tendon I will review the MRI once she has it completed and will plan for surgical intervention.  Patient states understanding I discussed this in extensive detail with the patient.  No follow-ups on file.

## 2020-10-24 DIAGNOSIS — R5383 Other fatigue: Secondary | ICD-10-CM | POA: Insufficient documentation

## 2020-10-24 DIAGNOSIS — Z1231 Encounter for screening mammogram for malignant neoplasm of breast: Secondary | ICD-10-CM | POA: Insufficient documentation

## 2020-10-24 DIAGNOSIS — R300 Dysuria: Secondary | ICD-10-CM | POA: Insufficient documentation

## 2020-10-24 DIAGNOSIS — E538 Deficiency of other specified B group vitamins: Secondary | ICD-10-CM | POA: Insufficient documentation

## 2020-10-24 DIAGNOSIS — M064 Inflammatory polyarthropathy: Secondary | ICD-10-CM | POA: Insufficient documentation

## 2020-10-24 DIAGNOSIS — Z0001 Encounter for general adult medical examination with abnormal findings: Secondary | ICD-10-CM | POA: Insufficient documentation

## 2020-10-25 LAB — POCT GLYCOSYLATED HEMOGLOBIN (HGB A1C): Hemoglobin A1C: 6.1 % — AB (ref 4.0–5.6)

## 2020-10-26 ENCOUNTER — Encounter: Payer: Self-pay | Admitting: *Deleted

## 2020-10-26 ENCOUNTER — Encounter: Payer: Self-pay | Admitting: Podiatry

## 2020-10-26 ENCOUNTER — Ambulatory Visit: Payer: Medicare Other | Admitting: Podiatry

## 2020-10-26 DIAGNOSIS — S86011A Strain of right Achilles tendon, initial encounter: Secondary | ICD-10-CM

## 2020-10-26 DIAGNOSIS — Z01818 Encounter for other preprocedural examination: Secondary | ICD-10-CM

## 2020-10-26 NOTE — Patient Instructions (Signed)
Pre-Operative Instructions  Congratulations, you have decided to take an important step towards improving your quality of life.  You can be assured that the doctors and staff at [facility] will be with you every step of the way.  Here are some important things you should know:  1. Plan to be at the surgery center/hospital at least 1 (one) hour prior to your scheduled time, unless otherwise directed by the surgical center/hospital staff.  You must have a responsible adult accompany you, remain during the surgery and drive you home.  Make sure you have directions to the surgical center/hospital to ensure you arrive on time. 2. If you are having surgery at [facility] or [facility] hospitals, you will need a copy of your medical history and physical form from your family physician within one month prior to the date of surgery. We will give you a form for your primary physician to complete.  3. We make every effort to accommodate the date you request for surgery.  However, there are times where surgery dates or times have to be moved.  We will contact you as soon as possible if a change in schedule is required.   4. No aspirin/ibuprofen for one week before surgery.  If you are on aspirin, any non-steroidal anti-inflammatory medications (Mobic, Aleve, Ibuprofen) should not be taken seven (7) days prior to your surgery.  You make take Tylenol for pain prior to surgery.  5. Medications - If you are taking daily heart and blood pressure medications, seizure, reflux, allergy, asthma, anxiety, pain or diabetes medications, make sure you notify the surgery center/hospital before the day of surgery so they can tell you which medications you should take or avoid the day of surgery. 6. No food or drink after midnight the night before surgery unless directed otherwise by surgical center/hospital staff. 7. No alcoholic beverages 24-hours prior to surgery.  No smoking 24-hours prior or 24-hours after  surgery. 8. Wear loose pants or shorts. They should be loose enough to fit over bandages, boots, and casts. 9. Don't wear slip-on shoes. Sneakers are preferred. 10. Bring your boot with you to the surgery center/hospital.  Also bring crutches or a walker if your physician has prescribed it for you.  If you do not have this equipment, it will be provided for you after surgery. 11. If you have not been contacted by the surgery center/hospital by the day before your surgery, call to confirm the date and time of your surgery. 12. Leave-time from work may vary depending on the type of surgery you have.  Appropriate arrangements should be made prior to surgery with your employer. 13. Prescriptions will be provided immediately following surgery by your doctor.  Fill these as soon as possible after surgery and take the medication as directed. Pain medications will not be refilled on weekends and must be approved by the doctor. 14. Remove nail polish on the operative foot and avoid getting pedicures prior to surgery. 15. Wash the night before surgery.  The night before surgery wash the foot and leg well with water and the antibacterial soap provided. Be sure to pay special attention to beneath the toenails and in between the toes.  Wash for at least three (3) minutes. Rinse thoroughly with water and dry well with a towel.  Perform this wash unless told not to do so by your physician.  Enclosed: 1 Ice pack (please put in freezer the night before surgery)   1 Hibiclens skin cleaner     Pre-op instructions  If you have any questions regarding the instructions, please do not hesitate to call our office.  [place]: [address], [place], [place] [address] -- [phone]  [place]: [address] [place], [place] [address] -- [phone]  [place]: [address], [place], [place] [address] -- [phone]   Website: [email or web address] 

## 2020-10-27 ENCOUNTER — Encounter: Payer: Self-pay | Admitting: Podiatry

## 2020-10-27 ENCOUNTER — Telehealth: Payer: Self-pay

## 2020-10-27 DIAGNOSIS — M545 Low back pain, unspecified: Secondary | ICD-10-CM | POA: Diagnosis not present

## 2020-10-27 DIAGNOSIS — Z79899 Other long term (current) drug therapy: Secondary | ICD-10-CM | POA: Diagnosis not present

## 2020-10-27 DIAGNOSIS — G8929 Other chronic pain: Secondary | ICD-10-CM | POA: Diagnosis not present

## 2020-10-27 DIAGNOSIS — M25562 Pain in left knee: Secondary | ICD-10-CM | POA: Diagnosis not present

## 2020-10-27 NOTE — Telephone Encounter (Signed)
DOS 11/01/2020  REPAIR ACHILLES TENDON RT - [place] - [id number]  [facility] MEDICARE EFFECTIVE DATE - 08/11/2020  PLAN DEDUCTIBLE - $0.00 OUT OF POCKET - $3600.00 W/ $2394.80 REMAINING  CO-INSURANCE 0% / Day OUTPATIENT SURGERY 0% / [place] $295 / Day OUTPATIENT SURGERY $295 / [place]   Notification or Prior Authorization is not required for the requested services  Decision ID #:[id number]

## 2020-10-27 NOTE — Progress Notes (Signed)
Subjective:  Patient ID: [name] [name], female    DOB: 12/22/1975,  MRN: [id number]  Chief Complaint  Patient presents with  . Tendonitis    here to discuss MRI results, surgical consult    44 y.o. female presents with the above complaint.  Patient presents follow-up of her right Achilles tendon rupture.  She states that it is still very painful.  She has been nonweightbearing to the right lower extremity.  She states that she would like to discuss surgical option.  She got her MRI and wants to go over the results.  Review of Systems: Negative except as noted in the HPI. Denies N/V/F/Ch.  Past Medical History:  Diagnosis Date  . Actinic keratitis   . Allergy   . Anxiety   . Asthma   . Basal cell carcinoma   . Chronic venous insufficiency   . DDD (degenerative disc disease), lumbar   . Diabetes mellitus without complication ([place])   . Fibromyalgia   . Fibromyalgia   . Hyperlipidemia   . Lymphedema   . Melanoma ([place])   . Migraine   . Mitral valve prolapse   . Tendonitis of foot    left    Current Outpatient Medications:  .  ACCU-CHEK AVIVA PLUS test strip, , Disp: , Rfl:  .  Accu-Chek Softclix Lancets lancets, Use as instructed. DX E11.65, Disp: 100 each, Rfl: 3 .  albuterol (VENTOLIN HFA) 108 (90 Base) MCG/ACT inhaler, Inhale into the lungs., Disp: , Rfl:  .  ammonium lactate (LAC-HYDRIN) 12 % lotion, Apply 1 application topically as needed for dry skin., Disp: 400 g, Rfl: 0 .  amoxicillin-clavulanate (AUGMENTIN) 875-125 MG tablet, Take 1 tablet by mouth 2 (two) times daily., Disp: 20 tablet, Rfl: 0 .  cetirizine (ZYRTEC) 10 MG tablet, Take by mouth., Disp: , Rfl:  .  Cholecalciferol 25 MCG (1000 UT) tablet, Take by mouth., Disp: , Rfl:  .  conjugated estrogens (PREMARIN) vaginal cream, Place 1 Applicatorful vaginally daily. Twice a week, Disp: 90 g, Rfl: 1 .  cyclobenzaprine (FLEXERIL) 10 MG tablet, Take 10 mg by mouth 3 (three) times daily as needed for muscle  spasms., Disp: , Rfl:  .  Docusate Sodium (DSS) 100 MG CAPS, , Disp: , Rfl:  .  FARXIGA 10 MG TABS tablet, TAKE 1 TABLET BY MOUTH EVERY DAY, Disp: 30 tablet, Rfl: 5 .  fenofibrate (TRICOR) 145 MG tablet, Take by mouth., Disp: , Rfl:  .  fluconazole (DIFLUCAN) 150 MG tablet, Take 1 tablet po once. May repeat dose in 3 days as needed for persistent symptoms., Disp: 3 tablet, Rfl: 0 .  FLUoxetine (PROZAC) 20 MG capsule, TAKE 1 CAPSULE BY MOUTH EVERY DAY, Disp: 90 capsule, Rfl: 1 .  fluticasone (FLONASE) 50 MCG/ACT nasal spray, Place 2 sprays into both nostrils daily., Disp: , Rfl:  .  furosemide (LASIX) 20 MG tablet, Take 20 mg by mouth. Take 1/2 tab as needed, Disp: , Rfl:  .  HYDROmorphone (DILAUDID) 4 MG tablet, Take by mouth every 6 (six) hours as needed for severe pain., Disp: , Rfl:  .  insulin glargine, 2 Unit Dial, (TOUJEO MAX SOLOSTAR) 300 UNIT/ML Solostar Pen, Inject 35 Units into the skin daily., Disp: 12 mL, Rfl: 1 .  Insulin Pen Needle 31G X 5 MM MISC, Use as directed  Once a day Dx e11.65, Disp: 100 each, Rfl: 3 .  Ipratropium-Albuterol (COMBIVENT RESPIMAT) 20-100 MCG/ACT AERS respimat, Inhale 1 puff into the lungs every 6 (six) hours.,  Disp: , Rfl:  .  levothyroxine (SYNTHROID) 50 MCG tablet, TAKE 1 TABLET BY MOUTH EVERY DAY, Disp: 90 tablet, Rfl: 3 .  meloxicam (MOBIC) 15 MG tablet, Take 15 mg by mouth daily., Disp: , Rfl:  .  Methylnaltrexone Bromide (RELISTOR) 150 MG TABS, Take 3 tablets by mouth., Disp: , Rfl:  .  morphine (MS CONTIN) 60 MG 12 hr tablet, Take 60 mg by mouth every 12 (twelve) hours., Disp: , Rfl:  .  Multiple Vitamin (MULTI-VITAMIN) tablet, Take 1 tablet by mouth daily., Disp: , Rfl:  .  naloxegol oxalate (MOVANTIK) 12.5 MG TABS tablet, TAKE 1 TABLET(S) BY MOUTH DAILY ON EMPTY STOMACH 1 HOUR BEFORE OR 2 HOURS AFTER FIRST MEAL. (Patient not taking: Reported on 10/04/2020), Disp: , Rfl:  .  pantoprazole (PROTONIX) 40 MG tablet, TAKE 1 TABLET BY MOUTH EVERY DAY, Disp:  90 tablet, Rfl: 1 .  pravastatin (PRAVACHOL) 10 MG tablet, Take 1 tablet (10 mg total) by mouth daily., Disp: 90 tablet, Rfl: 2 .  pregabalin (LYRICA) 150 MG capsule, Take 150 mg by mouth 3 (three) times daily. , Disp: , Rfl:  .  promethazine (PHENERGAN) 25 MG tablet, Take 1 tablet (25 mg total) by mouth every 6 (six) hours as needed for nausea or vomiting., Disp: 30 tablet, Rfl: 0 .  SUMAtriptan (IMITREX) 100 MG tablet, Take 100 mg by mouth every 2 (two) hours as needed for migraine. May repeat in 2 hours if headache persists or recurs., Disp: , Rfl:  .  triamcinolone ointment (KENALOG) 0.1 %, Apply to affected area daily for two weeks, Disp: , Rfl:  .  VICTOZA 18 MG/3ML SOPN, INJECT 1.8 MG UNDER THE SKIN ONCE DAILY, Disp: 3 pen, Rfl: 3 .  Vitamin D, Ergocalciferol, (DRISDOL) 1.25 MG (50000 UNIT) CAPS capsule, TAKE 1 CAPSULE (50,000 UNITS TOTAL) BY MOUTH EVERY 7 (SEVEN) DAYS., Disp: 8 capsule, Rfl: 0  Social History   Tobacco Use  Smoking Status Never Smoker  Smokeless Tobacco Never Used    Allergies  Allergen Reactions  . Oxycodone Hcl Nausea And Vomiting  . Pollen Extract Other (See Comments)    Congestion, sneezing Congestion, sneezing   . Sulfa Antibiotics Other (See Comments)  . Topiramate Other (See Comments)    Jerking  Jerking  Jerking  Other reaction(s): Other (See Comments) Jerking    . Bee Venom Hives, Itching and Swelling  . Crestor [Rosuvastatin] Other (See Comments)    Muscle spasms  . Doxycycline Nausea Only  . Oxycontin [Oxycodone] Nausea Only   Objective:  There were no vitals filed for this visit. There is no height or weight on file to calculate BMI. Constitutional Well developed. Well nourished.  Vascular Dorsalis pedis pulses palpable bilaterally. Posterior tibial pulses palpable bilaterally. Capillary refill normal to all digits.  No cyanosis or clubbing noted. Pedal hair growth normal.  Neurologic Normal speech. Oriented to person, place, and  time. Epicritic sensation to light touch grossly present bilaterally.  Dermatologic Nails well groomed and normal in appearance. No open wounds. No skin lesions.  Orthopedic:  Pain on palpation along the course of the Achilles tendon.  Hatchet strike defect defect noted to the posterior Achilles tendon.  No continuity or palpable cord noted of the Achilles tendon.  Unable to plantarflex of the foot with squeezing of the calf.  0 out of 5 strength plantar flexion.  5 out of 5's ankle joint strength of the dorsiflexion.  No active and passive range of motion noted of the  plantar flexion of the ankle  Positive Silfverskiold test with gastrocnemius equinus as well noted prior to Achilles tendon rupture   Radiographs: 3 views of skeletally mature adult right foot: No osseous abnormalities noted no fractures noted. No arthritic changes noted.  Obliteration of the fat pad noted.  IMPRESSION: 1. Complete tear of the distal Achilles tendon with approximately 3.5 cm of retraction. No bony avulsion injury. 2. Findings suggestive of remote ATFL injury. 3. Fusiform thickening of the central band of the plantar fascia without tear or perifascial edema suggesting sequela of plantar fasciitis. Assessment:   1. Achilles tendon rupture, right, initial encounter   2. Preoperative examination    Plan:  Patient was evaluated and treated and all questions answered.  right acute Achilles tendon rupture -Patient presents with acute Achilles tendon rupture clinically I cannot.  I can feel the palpable defect in the back of the heel cord.  Patient has no strength in the plantarflexion of the ankle joint.  At this time I believe he I have asked the patient be completely nonweightbearing with a cam boot on.  She states understanding she will be nonweightbearing.  She has knee scooter and walker to help maintain with that as well.   -MRI was obtained and discussed with the patient in extensive detail.  Patient does  have a distal tear of the Achilles tendon approximately 1 cm from its distal insertion.  I discussed with the patient that she will benefit from primary repair of the Achilles with immobilization in a cast.  Given the nature and the location patient is not an ideal candidate for percutaneous Achilles tendon repair.  I discussed with the patient that we will plan on making a longitudinal incision and given that patient has an underlying gastrocnemius equinus I believe she will also benefit from gastrocnemius recession for me to pull down the proximal tear and allow it to heal in a primary closure.  I discussed with the patient extensive detail.  She states understanding would like to proceed with the surgery. -I discussed my postop protocol in extensive detail as well.  She will be nonweightbearing to the right lower extremity in a cast in a slightly plantarflexed position.  After 6 weeks of nonweightbearing she will be in boot weightbearing as tolerated with physical therapy.  She has obtained knee scooter -Informed surgical risk consent was reviewed and read aloud to the patient.  I reviewed the films.  I have discussed my findings with the patient in great detail.  I have discussed all risks including but not limited to infection, stiffness, scarring, limp, disability, deformity, damage to blood vessels and nerves, numbness, poor healing, need for braces, arthritis, chronic pain, amputation, death.  All benefits and realistic expectations discussed in great detail.  I have made no promises as to the outcome.  I have provided realistic expectations.  I have offered the patient a 2nd opinion, which they have declined and assured me they preferred to proceed despite the risks -A total of 33 minutes was spent in direct patient care as well as pre and post patient encounter activities.  This includes documentation as well as reviewing patient chart for labs, imaging, past medical, surgical, social, and family  history as documented in the EMR.  I have reviewed medication allergies as documented in EMR.  I discussed the etiology of condition and treatment options from conservative to surgical care.  All risks and benefit of the treatment course was discussed in detail.  All questions  were answered and return appointment was discussed.  Since the visit completed in an ambulatory/outpatient setting, the patient and/or parent/guardian has been advised to contact the providers office for worsening condition and seek medical treatment and/or call 911 if the patient deems either is necessary.   No follow-ups on file.

## 2020-11-01 DIAGNOSIS — M66871 Spontaneous rupture of other tendons, right ankle and foot: Secondary | ICD-10-CM | POA: Diagnosis not present

## 2020-11-01 DIAGNOSIS — S86011A Strain of right Achilles tendon, initial encounter: Secondary | ICD-10-CM | POA: Diagnosis not present

## 2020-11-01 DIAGNOSIS — M216X1 Other acquired deformities of right foot: Secondary | ICD-10-CM | POA: Diagnosis not present

## 2020-11-01 DIAGNOSIS — M25571 Pain in right ankle and joints of right foot: Secondary | ICD-10-CM | POA: Diagnosis not present

## 2020-11-01 MED ORDER — IBUPROFEN 800 MG PO TABS: 800.0000 mg | ORAL_TABLET | Freq: Four times a day (QID) | ORAL | 1 refills | Status: DC | PRN

## 2020-11-01 MED ORDER — HYDROCODONE-ACETAMINOPHEN 10-325 MG PO TABS
1.0000 | ORAL_TABLET | Freq: Three times a day (TID) | ORAL | 0 refills | Status: AC | PRN
Start: 2020-11-01 — End: 2020-11-06

## 2020-11-01 NOTE — Addendum Note (Signed)
Addended by: Boneta Lucks on: 11/01/2020 09:21 AM   Modules accepted: Orders

## 2020-11-06 ENCOUNTER — Other Ambulatory Visit: Payer: Self-pay | Admitting: Hospice and Palliative Medicine

## 2020-11-06 DIAGNOSIS — F339 Major depressive disorder, recurrent, unspecified: Secondary | ICD-10-CM

## 2020-11-06 MED ORDER — FLUOXETINE HCL 20 MG PO CAPS: 20.0000 mg | ORAL_CAPSULE | Freq: Every day | ORAL | 0 refills | Status: DC

## 2020-11-08 ENCOUNTER — Ambulatory Visit: Payer: Medicare Other | Admitting: Nurse Practitioner

## 2020-11-09 ENCOUNTER — Encounter: Payer: Medicare Other | Admitting: Podiatry

## 2020-11-09 ENCOUNTER — Ambulatory Visit: Payer: Medicare Other | Admitting: Podiatry

## 2020-11-11 ENCOUNTER — Encounter: Payer: Medicare Other | Admitting: Podiatry

## 2020-11-15 DIAGNOSIS — G4733 Obstructive sleep apnea (adult) (pediatric): Secondary | ICD-10-CM | POA: Diagnosis not present

## 2020-11-16 ENCOUNTER — Ambulatory Visit (INDEPENDENT_AMBULATORY_CARE_PROVIDER_SITE_OTHER): Payer: Medicare Other | Admitting: Podiatry

## 2020-11-16 ENCOUNTER — Other Ambulatory Visit: Payer: Self-pay

## 2020-11-16 ENCOUNTER — Encounter: Payer: Self-pay | Admitting: Podiatry

## 2020-11-16 VITALS — BP 113/68 | HR 94 | Temp 98.7°F

## 2020-11-16 DIAGNOSIS — S86011A Strain of right Achilles tendon, initial encounter: Secondary | ICD-10-CM

## 2020-11-16 DIAGNOSIS — Z9889 Other specified postprocedural states: Secondary | ICD-10-CM | POA: Diagnosis not present

## 2020-11-16 NOTE — Progress Notes (Signed)
Subjective:  Patient ID: [name] [name], female    DOB: 1976/11/20,  MRN: [id number]  Chief Complaint  Patient presents with  . Routine Post Op    POV #1 DOS 11/01/2020 RT ACHILLES TENDON REPAIR, GASTROCNEMIUS RECESS     44 y.o. female returns for post-op check.  Patient is doing well.  She has been nonweightbearing with a knee scooter.  She is here for a cast change.  She denies any other acute complaints.  Review of Systems: Negative except as noted in the HPI. Denies N/V/F/Ch.  Past Medical History:  Diagnosis Date  . Actinic keratitis   . Allergy   . Anxiety   . Asthma   . Basal cell carcinoma   . Chronic venous insufficiency   . DDD (degenerative disc disease), lumbar   . Diabetes mellitus without complication ([place])   . Fibromyalgia   . Fibromyalgia   . Hyperlipidemia   . Lymphedema   . Melanoma ([place])   . Migraine   . Mitral valve prolapse   . Tendonitis of foot    left    Current Outpatient Medications:  .  ACCU-CHEK AVIVA PLUS test strip, , Disp: , Rfl:  .  Accu-Chek Softclix Lancets lancets, Use as instructed. DX E11.65, Disp: 100 each, Rfl: 3 .  albuterol (VENTOLIN HFA) 108 (90 Base) MCG/ACT inhaler, Inhale into the lungs., Disp: , Rfl:  .  ammonium lactate (LAC-HYDRIN) 12 % lotion, Apply 1 application topically as needed for dry skin., Disp: 400 g, Rfl: 0 .  amoxicillin-clavulanate (AUGMENTIN) 875-125 MG tablet, Take 1 tablet by mouth 2 (two) times daily., Disp: 20 tablet, Rfl: 0 .  cetirizine (ZYRTEC) 10 MG tablet, Take by mouth., Disp: , Rfl:  .  Cholecalciferol 25 MCG (1000 UT) tablet, Take by mouth., Disp: , Rfl:  .  conjugated estrogens (PREMARIN) vaginal cream, Place 1 Applicatorful vaginally daily. Twice a week, Disp: 90 g, Rfl: 1 .  cyclobenzaprine (FLEXERIL) 10 MG tablet, Take 10 mg by mouth 3 (three) times daily as needed for muscle spasms., Disp: , Rfl:  .  Docusate Sodium (DSS) 100 MG CAPS, , Disp: , Rfl:  .  FARXIGA 10 MG TABS tablet, TAKE 1  TABLET BY MOUTH EVERY DAY, Disp: 30 tablet, Rfl: 5 .  fenofibrate (TRICOR) 145 MG tablet, Take by mouth., Disp: , Rfl:  .  fluconazole (DIFLUCAN) 150 MG tablet, Take 1 tablet po once. May repeat dose in 3 days as needed for persistent symptoms., Disp: 3 tablet, Rfl: 0 .  FLUoxetine (PROZAC) 20 MG capsule, Take 1 capsule (20 mg total) by mouth daily., Disp: 90 capsule, Rfl: 0 .  fluticasone (FLONASE) 50 MCG/ACT nasal spray, Place 2 sprays into both nostrils daily., Disp: , Rfl:  .  furosemide (LASIX) 20 MG tablet, Take 20 mg by mouth. Take 1/2 tab as needed, Disp: , Rfl:  .  HYDROmorphone (DILAUDID) 4 MG tablet, Take by mouth every 6 (six) hours as needed for severe pain., Disp: , Rfl:  .  ibuprofen (ADVIL) 800 MG tablet, Take 1 tablet (800 mg total) by mouth every 6 (six) hours as needed., Disp: 60 tablet, Rfl: 1 .  insulin glargine, 2 Unit Dial, (TOUJEO MAX SOLOSTAR) 300 UNIT/ML Solostar Pen, Inject 35 Units into the skin daily., Disp: 12 mL, Rfl: 1 .  Insulin Pen Needle 31G X 5 MM MISC, Use as directed  Once a day Dx e11.65, Disp: 100 each, Rfl: 3 .  Ipratropium-Albuterol (COMBIVENT RESPIMAT) 20-100 MCG/ACT AERS respimat,  Inhale 1 puff into the lungs every 6 (six) hours., Disp: , Rfl:  .  levothyroxine (SYNTHROID) 50 MCG tablet, TAKE 1 TABLET BY MOUTH EVERY DAY, Disp: 90 tablet, Rfl: 3 .  meloxicam (MOBIC) 15 MG tablet, Take 15 mg by mouth daily., Disp: , Rfl:  .  Methylnaltrexone Bromide (RELISTOR) 150 MG TABS, Take 3 tablets by mouth., Disp: , Rfl:  .  morphine (MS CONTIN) 60 MG 12 hr tablet, Take 60 mg by mouth every 12 (twelve) hours., Disp: , Rfl:  .  Multiple Vitamin (MULTI-VITAMIN) tablet, Take 1 tablet by mouth daily., Disp: , Rfl:  .  naloxegol oxalate (MOVANTIK) 12.5 MG TABS tablet, TAKE 1 TABLET(S) BY MOUTH DAILY ON EMPTY STOMACH 1 HOUR BEFORE OR 2 HOURS AFTER FIRST MEAL. (Patient not taking: Reported on 10/04/2020), Disp: , Rfl:  .  pantoprazole (PROTONIX) 40 MG tablet, TAKE 1 TABLET  BY MOUTH EVERY DAY, Disp: 90 tablet, Rfl: 1 .  pravastatin (PRAVACHOL) 10 MG tablet, Take 1 tablet (10 mg total) by mouth daily., Disp: 90 tablet, Rfl: 2 .  pregabalin (LYRICA) 150 MG capsule, Take 150 mg by mouth 3 (three) times daily. , Disp: , Rfl:  .  promethazine (PHENERGAN) 25 MG tablet, Take 1 tablet (25 mg total) by mouth every 6 (six) hours as needed for nausea or vomiting., Disp: 30 tablet, Rfl: 0 .  SUMAtriptan (IMITREX) 100 MG tablet, Take 100 mg by mouth every 2 (two) hours as needed for migraine. May repeat in 2 hours if headache persists or recurs., Disp: , Rfl:  .  triamcinolone ointment (KENALOG) 0.1 %, Apply to affected area daily for two weeks, Disp: , Rfl:  .  VICTOZA 18 MG/3ML SOPN, INJECT 1.8 MG UNDER THE SKIN ONCE DAILY, Disp: 3 pen, Rfl: 3 .  Vitamin D, Ergocalciferol, (DRISDOL) 1.25 MG (50000 UNIT) CAPS capsule, TAKE 1 CAPSULE (50,000 UNITS TOTAL) BY MOUTH EVERY 7 (SEVEN) DAYS., Disp: 8 capsule, Rfl: 0  Social History   Tobacco Use  Smoking Status Never Smoker  Smokeless Tobacco Never Used    Allergies  Allergen Reactions  . Oxycodone Hcl Nausea And Vomiting  . Pollen Extract Other (See Comments)    Congestion, sneezing Congestion, sneezing   . Sulfa Antibiotics Other (See Comments)  . Topiramate Other (See Comments)    Jerking  Jerking  Jerking  Other reaction(s): Other (See Comments) Jerking    . Bee Venom Hives, Itching and Swelling  . Crestor [Rosuvastatin] Other (See Comments)    Muscle spasms  . Doxycycline Nausea Only  . Oxycontin [Oxycodone] Nausea Only   Objective:   Vitals:   11/16/20 1051  BP: 113/68  Pulse: 94  Temp: 98.7 F (37.1 C)   There is no height or weight on file to calculate BMI. Constitutional Well developed. Well nourished.  Vascular Foot warm and well perfused. Capillary refill normal to all digits.   Neurologic Normal speech. Oriented to person, place, and time. Epicritic sensation to light touch grossly present  bilaterally.  Dermatologic Skin healing well without signs of infection. Skin edges well coapted without signs of infection.  Orthopedic: Tenderness to palpation noted about the surgical site.   Radiographs: None Assessment:   1. Achilles tendon rupture, right, initial encounter   2. Status post foot surgery    Plan:  Patient was evaluated and treated and all questions answered.  S/p foot surgery right -Progressing as expected post-operatively. -XR: None -WB Status: Nonweightbearing right lower extremity in knee scooter -Sutures/staples: Intact.  No signs of dehiscence noted no clinical signs of infection noted -Medications: None -Foot redressed.  Cast was applied in standard technique.  No follow-ups on file.

## 2020-11-20 ENCOUNTER — Other Ambulatory Visit: Payer: Self-pay | Admitting: Internal Medicine

## 2020-11-22 ENCOUNTER — Telehealth: Payer: Self-pay

## 2020-11-22 NOTE — Telephone Encounter (Signed)
LMOM to confirm appt on 2/7 per [facility] for med refills

## 2020-11-22 NOTE — Telephone Encounter (Signed)
Refilled, will need app in 2 month

## 2020-11-23 ENCOUNTER — Encounter: Payer: Self-pay | Admitting: Podiatry

## 2020-11-23 ENCOUNTER — Ambulatory Visit (INDEPENDENT_AMBULATORY_CARE_PROVIDER_SITE_OTHER): Payer: Medicare Other | Admitting: Podiatry

## 2020-11-23 ENCOUNTER — Other Ambulatory Visit: Payer: Self-pay

## 2020-11-23 ENCOUNTER — Encounter: Payer: Medicare Other | Admitting: Podiatry

## 2020-11-23 DIAGNOSIS — S86011A Strain of right Achilles tendon, initial encounter: Secondary | ICD-10-CM

## 2020-11-23 DIAGNOSIS — Z9889 Other specified postprocedural states: Secondary | ICD-10-CM

## 2020-11-23 NOTE — Progress Notes (Signed)
Subjective:  Patient ID: [name] [name], female    DOB: 07-17-1976,  MRN: [id number]  Chief Complaint  Patient presents with  . Routine Post Op    Cast removed. Staples intact. No concerns voiced by patient      44 y.o. female returns for post-op check.  Patient is doing well.  She has been nonweightbearing with a knee scooter.  She is here for a cast change.  She denies any other acute complaints.  Review of Systems: Negative except as noted in the HPI. Denies N/V/F/Ch.  Past Medical History:  Diagnosis Date  . Actinic keratitis   . Allergy   . Anxiety   . Asthma   . Basal cell carcinoma   . Chronic venous insufficiency   . DDD (degenerative disc disease), lumbar   . Diabetes mellitus without complication ([place])   . Fibromyalgia   . Fibromyalgia   . Hyperlipidemia   . Lymphedema   . Melanoma ([place])   . Migraine   . Mitral valve prolapse   . Tendonitis of foot    left    Current Outpatient Medications:  .  ACCU-CHEK AVIVA PLUS test strip, , Disp: , Rfl:  .  Accu-Chek Softclix Lancets lancets, Use as instructed. DX E11.65, Disp: 100 each, Rfl: 3 .  albuterol (VENTOLIN HFA) 108 (90 Base) MCG/ACT inhaler, Inhale into the lungs., Disp: , Rfl:  .  ammonium lactate (LAC-HYDRIN) 12 % lotion, Apply 1 application topically as needed for dry skin., Disp: 400 g, Rfl: 0 .  amoxicillin-clavulanate (AUGMENTIN) 875-125 MG tablet, Take 1 tablet by mouth 2 (two) times daily., Disp: 20 tablet, Rfl: 0 .  cetirizine (ZYRTEC) 10 MG tablet, Take by mouth., Disp: , Rfl:  .  Cholecalciferol 25 MCG (1000 UT) tablet, Take by mouth., Disp: , Rfl:  .  conjugated estrogens (PREMARIN) vaginal cream, Place 1 Applicatorful vaginally daily. Twice a week, Disp: 90 g, Rfl: 1 .  cyclobenzaprine (FLEXERIL) 10 MG tablet, Take 10 mg by mouth 3 (three) times daily as needed for muscle spasms., Disp: , Rfl:  .  Docusate Sodium (DSS) 100 MG CAPS, , Disp: , Rfl:  .  FARXIGA 10 MG TABS tablet, TAKE 1 TABLET BY  MOUTH EVERY DAY, Disp: 30 tablet, Rfl: 5 .  fenofibrate (TRICOR) 145 MG tablet, Take by mouth., Disp: , Rfl:  .  fluconazole (DIFLUCAN) 150 MG tablet, Take 1 tablet po once. May repeat dose in 3 days as needed for persistent symptoms., Disp: 3 tablet, Rfl: 0 .  FLUoxetine (PROZAC) 20 MG capsule, Take 1 capsule (20 mg total) by mouth daily., Disp: 90 capsule, Rfl: 0 .  fluticasone (FLONASE) 50 MCG/ACT nasal spray, Place 2 sprays into both nostrils daily., Disp: , Rfl:  .  furosemide (LASIX) 20 MG tablet, Take 20 mg by mouth. Take 1/2 tab as needed, Disp: , Rfl:  .  HYDROmorphone (DILAUDID) 4 MG tablet, Take by mouth every 6 (six) hours as needed for severe pain., Disp: , Rfl:  .  ibuprofen (ADVIL) 800 MG tablet, Take 1 tablet (800 mg total) by mouth every 6 (six) hours as needed., Disp: 60 tablet, Rfl: 1 .  insulin glargine, 2 Unit Dial, (TOUJEO MAX SOLOSTAR) 300 UNIT/ML Solostar Pen, Inject 35 Units into the skin daily., Disp: 12 mL, Rfl: 1 .  Insulin Pen Needle 31G X 5 MM MISC, Use as directed  Once a day Dx e11.65, Disp: 100 each, Rfl: 3 .  Ipratropium-Albuterol (COMBIVENT RESPIMAT) 20-100 MCG/ACT AERS respimat,  Inhale 1 puff into the lungs every 6 (six) hours., Disp: , Rfl:  .  levothyroxine (SYNTHROID) 50 MCG tablet, TAKE 1 TABLET BY MOUTH EVERY DAY, Disp: 90 tablet, Rfl: 3 .  meloxicam (MOBIC) 15 MG tablet, Take 15 mg by mouth daily., Disp: , Rfl:  .  Methylnaltrexone Bromide (RELISTOR) 150 MG TABS, Take 3 tablets by mouth., Disp: , Rfl:  .  morphine (MS CONTIN) 60 MG 12 hr tablet, Take 60 mg by mouth every 12 (twelve) hours., Disp: , Rfl:  .  Multiple Vitamin (MULTI-VITAMIN) tablet, Take 1 tablet by mouth daily., Disp: , Rfl:  .  naloxegol oxalate (MOVANTIK) 12.5 MG TABS tablet, TAKE 1 TABLET(S) BY MOUTH DAILY ON EMPTY STOMACH 1 HOUR BEFORE OR 2 HOURS AFTER FIRST MEAL. (Patient not taking: Reported on 10/04/2020), Disp: , Rfl:  .  pantoprazole (PROTONIX) 40 MG tablet, TAKE 1 TABLET BY MOUTH  EVERY DAY, Disp: 90 tablet, Rfl: 1 .  pravastatin (PRAVACHOL) 10 MG tablet, Take 1 tablet (10 mg total) by mouth daily., Disp: 90 tablet, Rfl: 2 .  pregabalin (LYRICA) 150 MG capsule, Take 150 mg by mouth 3 (three) times daily. , Disp: , Rfl:  .  promethazine (PHENERGAN) 25 MG tablet, Take 1 tablet (25 mg total) by mouth every 6 (six) hours as needed for nausea or vomiting., Disp: 30 tablet, Rfl: 0 .  SUMAtriptan (IMITREX) 100 MG tablet, Take 100 mg by mouth every 2 (two) hours as needed for migraine. May repeat in 2 hours if headache persists or recurs., Disp: , Rfl:  .  triamcinolone ointment (KENALOG) 0.1 %, Apply to affected area daily for two weeks, Disp: , Rfl:  .  VICTOZA 18 MG/3ML SOPN, INJECT 1.8 MG UNDER THE SKIN ONCE DAILY, Disp: 3 pen, Rfl: 3 .  Vitamin D, Ergocalciferol, (DRISDOL) 1.25 MG (50000 UNIT) CAPS capsule, TAKE 1 CAPSULE (50,000 UNITS TOTAL) BY MOUTH EVERY 7 (SEVEN) DAYS., Disp: 8 capsule, Rfl: 0  Social History   Tobacco Use  Smoking Status Never Smoker  Smokeless Tobacco Never Used    Allergies  Allergen Reactions  . Oxycodone Hcl Nausea And Vomiting  . Pollen Extract Other (See Comments)    Congestion, sneezing Congestion, sneezing   . Sulfa Antibiotics Other (See Comments)  . Topiramate Other (See Comments)    Jerking  Jerking  Jerking  Other reaction(s): Other (See Comments) Jerking    . Bee Venom Hives, Itching and Swelling  . Crestor [Rosuvastatin] Other (See Comments)    Muscle spasms  . Doxycycline Nausea Only  . Oxycontin [Oxycodone] Nausea Only   Objective:   There were no vitals filed for this visit. There is no height or weight on file to calculate BMI. Constitutional Well developed. Well nourished.  Vascular Foot warm and well perfused. Capillary refill normal to all digits.   Neurologic Normal speech. Oriented to person, place, and time. Epicritic sensation to light touch grossly present bilaterally.  Dermatologic Skin healing well  without signs of infection. Skin edges well coapted without signs of infection.  Orthopedic: Tenderness to palpation noted about the surgical site.   Radiographs: None Assessment:   1. Achilles tendon rupture, right, initial encounter   2. Status post foot surgery    Plan:  Patient was evaluated and treated and all questions answered.  S/p foot surgery right -Progressing as expected post-operatively. -XR: None -WB Status: Nonweightbearing right lower extremity in knee scooter -Sutures/staples: Removed no signs of dehiscence noted no clinical signs of infection noted -  Medications: None -Cam boot was applied.  Patient was to be nonweightbearing to the right. -Ace bandage was applied -Order for physical therapy was sent.  For [facility] for general deconditioning, evaluation and treat and strengthening the Achilles tendon  No follow-ups on file.

## 2020-11-27 DIAGNOSIS — G8929 Other chronic pain: Secondary | ICD-10-CM | POA: Diagnosis not present

## 2020-11-27 DIAGNOSIS — M545 Low back pain, unspecified: Secondary | ICD-10-CM | POA: Diagnosis not present

## 2020-11-27 DIAGNOSIS — Z79899 Other long term (current) drug therapy: Secondary | ICD-10-CM | POA: Diagnosis not present

## 2020-11-27 DIAGNOSIS — M25562 Pain in left knee: Secondary | ICD-10-CM | POA: Diagnosis not present

## 2020-12-07 ENCOUNTER — Ambulatory Visit (INDEPENDENT_AMBULATORY_CARE_PROVIDER_SITE_OTHER): Payer: Medicare Other | Admitting: Podiatry

## 2020-12-07 ENCOUNTER — Encounter: Payer: Self-pay | Admitting: Podiatry

## 2020-12-07 ENCOUNTER — Other Ambulatory Visit: Payer: Self-pay

## 2020-12-07 DIAGNOSIS — S86011A Strain of right Achilles tendon, initial encounter: Secondary | ICD-10-CM

## 2020-12-07 DIAGNOSIS — Z9889 Other specified postprocedural states: Secondary | ICD-10-CM

## 2020-12-07 NOTE — Progress Notes (Signed)
Subjective:  Patient ID: [name] [name], female    DOB: [birth year],  MRN: [id number]  Chief Complaint  Patient presents with  . Routine Post Op    DOS 11/01/2020 RT ACHILLES TENDON REPAIR, GASTROCNEMIUS RECESS     44 y.o. female returns for post-op check.  Patient is doing well.  She has been nonweightbearing with a knee scooter.  She is here for a cast change.  She denies any other acute complaints.  Review of Systems: Negative except as noted in the HPI. Denies N/V/F/Ch.  Past Medical History:  Diagnosis Date  . Actinic keratitis   . Allergy   . Anxiety   . Asthma   . Basal cell carcinoma   . Chronic venous insufficiency   . DDD (degenerative disc disease), lumbar   . Diabetes mellitus without complication (HCC)   . Fibromyalgia   . Fibromyalgia   . Hyperlipidemia   . Lymphedema   . Melanoma (HCC)   . Migraine   . Mitral valve prolapse   . Tendonitis of foot    left    Current Outpatient Medications:  .  ACCU-CHEK AVIVA PLUS test strip, , Disp: , Rfl:  .  Accu-Chek Softclix Lancets lancets, Use as instructed. DX E11.65, Disp: 100 each, Rfl: 3 .  albuterol (VENTOLIN HFA) 108 (90 Base) MCG/ACT inhaler, Inhale into the lungs., Disp: , Rfl:  .  ammonium lactate (LAC-HYDRIN) 12 % lotion, Apply 1 application topically as needed for dry skin., Disp: 400 g, Rfl: 0 .  amoxicillin-clavulanate (AUGMENTIN) 875-125 MG tablet, Take 1 tablet by mouth 2 (two) times daily., Disp: 20 tablet, Rfl: 0 .  cetirizine (ZYRTEC) 10 MG tablet, Take by mouth., Disp: , Rfl:  .  Cholecalciferol 25 MCG (1000 UT) tablet, Take by mouth., Disp: , Rfl:  .  conjugated estrogens (PREMARIN) vaginal cream, Place 1 Applicatorful vaginally daily. Twice a week, Disp: 90 g, Rfl: 1 .  cyclobenzaprine (FLEXERIL) 10 MG tablet, Take 10 mg by mouth 3 (three) times daily as needed for muscle spasms., Disp: , Rfl:  .  Docusate Sodium (DSS) 100 MG CAPS, , Disp: , Rfl:  .  FARXIGA 10 MG TABS tablet, TAKE 1 TABLET  BY MOUTH EVERY DAY, Disp: 30 tablet, Rfl: 5 .  fenofibrate (TRICOR) 145 MG tablet, Take by mouth., Disp: , Rfl:  .  fluconazole (DIFLUCAN) 150 MG tablet, Take 1 tablet po once. May repeat dose in 3 days as needed for persistent symptoms., Disp: 3 tablet, Rfl: 0 .  FLUoxetine (PROZAC) 20 MG capsule, Take 1 capsule (20 mg total) by mouth daily., Disp: 90 capsule, Rfl: 0 .  fluticasone (FLONASE) 50 MCG/ACT nasal spray, Place 2 sprays into both nostrils daily., Disp: , Rfl:  .  furosemide (LASIX) 20 MG tablet, Take 20 mg by mouth. Take 1/2 tab as needed, Disp: , Rfl:  .  HYDROmorphone (DILAUDID) 4 MG tablet, Take by mouth every 6 (six) hours as needed for severe pain., Disp: , Rfl:  .  ibuprofen (ADVIL) 800 MG tablet, Take 1 tablet (800 mg total) by mouth every 6 (six) hours as needed., Disp: 60 tablet, Rfl: 1 .  insulin glargine, 2 Unit Dial, (TOUJEO MAX SOLOSTAR) 300 UNIT/ML Solostar Pen, Inject 35 Units into the skin daily., Disp: 12 mL, Rfl: 1 .  Insulin Pen Needle 31G X 5 MM MISC, Use as directed  Once a day Dx e11.65, Disp: 100 each, Rfl: 3 .  Ipratropium-Albuterol (COMBIVENT RESPIMAT) 20-100 MCG/ACT AERS respimat, Inhale 1  puff into the lungs every 6 (six) hours., Disp: , Rfl:  .  levothyroxine (SYNTHROID) 50 MCG tablet, TAKE 1 TABLET BY MOUTH EVERY DAY, Disp: 90 tablet, Rfl: 3 .  meloxicam (MOBIC) 15 MG tablet, Take 15 mg by mouth daily., Disp: , Rfl:  .  Methylnaltrexone Bromide (RELISTOR) 150 MG TABS, Take 3 tablets by mouth., Disp: , Rfl:  .  morphine (MS CONTIN) 60 MG 12 hr tablet, Take 60 mg by mouth every 12 (twelve) hours., Disp: , Rfl:  .  Multiple Vitamin (MULTI-VITAMIN) tablet, Take 1 tablet by mouth daily., Disp: , Rfl:  .  naloxegol oxalate (MOVANTIK) 12.5 MG TABS tablet, TAKE 1 TABLET(S) BY MOUTH DAILY ON EMPTY STOMACH 1 HOUR BEFORE OR 2 HOURS AFTER FIRST MEAL. (Patient not taking: Reported on 10/04/2020), Disp: , Rfl:  .  pantoprazole (PROTONIX) 40 MG tablet, TAKE 1 TABLET BY MOUTH  EVERY DAY, Disp: 90 tablet, Rfl: 1 .  pravastatin (PRAVACHOL) 10 MG tablet, Take 1 tablet (10 mg total) by mouth daily., Disp: 90 tablet, Rfl: 2 .  pregabalin (LYRICA) 150 MG capsule, Take 150 mg by mouth 3 (three) times daily. , Disp: , Rfl:  .  promethazine (PHENERGAN) 25 MG tablet, Take 1 tablet (25 mg total) by mouth every 6 (six) hours as needed for nausea or vomiting., Disp: 30 tablet, Rfl: 0 .  SUMAtriptan (IMITREX) 100 MG tablet, Take 100 mg by mouth every 2 (two) hours as needed for migraine. May repeat in 2 hours if headache persists or recurs., Disp: , Rfl:  .  triamcinolone ointment (KENALOG) 0.1 %, Apply to affected area daily for two weeks, Disp: , Rfl:  .  VICTOZA 18 MG/3ML SOPN, INJECT 1.8 MG UNDER THE SKIN ONCE DAILY, Disp: 3 pen, Rfl: 3 .  Vitamin D, Ergocalciferol, (DRISDOL) 1.25 MG (50000 UNIT) CAPS capsule, TAKE 1 CAPSULE (50,000 UNITS TOTAL) BY MOUTH EVERY 7 (SEVEN) DAYS., Disp: 8 capsule, Rfl: 0  Social History   Tobacco Use  Smoking Status Never Smoker  Smokeless Tobacco Never Used    Allergies  Allergen Reactions  . Oxycodone Hcl Nausea And Vomiting  . Pollen Extract Other (See Comments)    Congestion, sneezing Congestion, sneezing   . Sulfa Antibiotics Other (See Comments)  . Topiramate Other (See Comments)    Jerking  Jerking  Jerking  Other reaction(s): Other (See Comments) Jerking    . Bee Venom Hives, Itching and Swelling  . Crestor [Rosuvastatin] Other (See Comments)    Muscle spasms  . Doxycycline Nausea Only  . Oxycontin [Oxycodone] Nausea Only   Objective:   There were no vitals filed for this visit. There is no height or weight on file to calculate BMI. Constitutional Well developed. Well nourished.  Vascular Foot warm and well perfused. Capillary refill normal to all digits.   Neurologic Normal speech. Oriented to person, place, and time. Epicritic sensation to light touch grossly present bilaterally.  Dermatologic Skin healing well  without signs of infection. Skin edges well coapted without signs of infection.  Orthopedic: Tenderness to palpation noted about the surgical site.   Radiographs: None Assessment:   1. Achilles tendon rupture, right, initial encounter   2. Status post foot surgery    Plan:  Patient was evaluated and treated and all questions answered.  S/p foot surgery right -Progressing as expected post-operatively. -XR: None -WB Status: Nonweightbearing right lower extremity in knee scooter -Sutures/staples: Removed no signs of dehiscence noted no clinical signs of infection noted -Medications: None -  Ace bandage was applied -Physical therapy is to start next week.  I encouraged her to continue doing physical therapy she can start weightbearing as tolerated at a total time of 6 weeks after surgery.  No follow-ups on file.

## 2020-12-14 ENCOUNTER — Encounter (HOSPITAL_COMMUNITY): Payer: Self-pay

## 2020-12-14 ENCOUNTER — Other Ambulatory Visit: Payer: Self-pay

## 2020-12-14 ENCOUNTER — Ambulatory Visit (HOSPITAL_COMMUNITY): Payer: Medicare Other | Attending: Podiatry

## 2020-12-14 DIAGNOSIS — R262 Difficulty in walking, not elsewhere classified: Secondary | ICD-10-CM | POA: Insufficient documentation

## 2020-12-14 DIAGNOSIS — R2681 Unsteadiness on feet: Secondary | ICD-10-CM | POA: Diagnosis not present

## 2020-12-14 NOTE — Therapy (Signed)
[place], [place], [address] Phone: [phone]   Fax:  [phone]  Physical Therapy Evaluation  Patient Details  Name: [name] [name] MRN: [id number] Date of Birth: 08/03/1976 Referring Provider (PT): Dr. Boneta Lucks   Encounter Date: 12/14/2020   PT End of Session - 12/14/20 1255    Visit Number 1    Number of Visits 12    Date for PT Re-Evaluation 03/08/21    Authorization Type Primary insurance: Medicare Part B 80/20%; secondary: Pax HMO, no auth PT/ST    PT Start Time 1300    PT Stop Time 1345    PT Time Calculation (min) 45 min    Activity Tolerance Patient tolerated treatment well    Behavior During Therapy [facility] for tasks assessed/performed           Past Medical History:  Diagnosis Date  . Actinic keratitis   . Allergy   . Anxiety   . Asthma   . Basal cell carcinoma   . Chronic venous insufficiency   . DDD (degenerative disc disease), lumbar   . Diabetes mellitus without complication ([place])   . Fibromyalgia   . Fibromyalgia   . Hyperlipidemia   . Lymphedema   . Melanoma ([place])   . Migraine   . Mitral valve prolapse   . Tendonitis of foot    left    Past Surgical History:  Procedure Laterality Date  . ABDOMINAL HYSTERECTOMY    . CARPAL TUNNEL RELEASE    . KNEE SURGERY    . SPINAL FUSION    . SPINAL FUSION      There were no vitals filed for this visit.    Subjective Assessment - 12/14/20 1314    Subjective Patient underwent right Achilles tendon repoair on 11/01/20. Now presents 6 weeks post-op and has been using knee scooter and NWB for mobility.  Per last MD visit she is allowed to progress to WBAT (presumably with CAM boot).  Patient reports Achilles tendonitis present LLE as well.  Patient was independent in all aspects at [facility]    Currently in Pain? No/denies              [facility] PT Assessment - 12/14/20 0001      Assessment   Medical Diagnosis Right  Achilles Repair    Referring Provider (PT) Dr. Boneta Lucks    Onset Date/Surgical Date 11/01/20      Restrictions   Weight Bearing Restrictions Yes    RLE Weight Bearing Weight bearing as tolerated    Other Position/Activity Restrictions --   with CAM boot 6 weeks after surgery     Balance Screen   Has the patient fallen in the past 6 months No      [place] residence    Living Arrangements Other relatives    Available Help at Discharge Family    Type of [place] Access Level entry    [place] - single point;Grab bars - toilet;Grab bars - tub/shower   knee scooter     Prior Function   Level of Independence Independent    Vocation Full time employment    Vocation Requirements CNA      Sensation   Light Touch Appears Intact      ROM / Strength   AROM / PROM / Strength Strength;PROM      PROM   Overall PROM  --  able to achieve plantigrade with CAM boot     Strength   Overall Strength --   Not tested due to post-op precautions right ankle     Ambulation/Gait   Ambulation/Gait Yes    Ambulation/Gait Assistance 6: Modified independent (Device/Increase time)    Ambulation Distance (Feet) 50 Feet    Assistive device Rolling walker   CAM boot right   Gait Pattern Step-to pattern    Ambulation Surface Level    Gait velocity decreased    Gait Comments                      Objective measurements completed on examination: See above findings.       [facility] Adult PT Treatment/Exercise - 12/14/20 0001      Ambulation/Gait   Pre-Gait Activities pt instructed in gait pattern and optimal height set-up for RW to improve mechanics      Exercises   Exercises Ankle      Ankle Exercises: Stretches   Gastroc Stretch 3 reps;30 seconds   with towel and instruction for "Gentle" stretch     Ankle Exercises: Seated   ABC's 1 rep    Towel Crunch 5 reps    Toe Raise 20 reps;2 seconds                   PT Education - 12/14/20 1344    Education Details Pt educated on WBAT using CAM boot and AD and importance of tendon healing and compliance with MD protocol and post-op instructions. Education on use of RW for ambulation vs crutches    Person(s) Educated Patient    Methods Explanation    Comprehension Verbalized understanding;Returned demonstration            PT Short Term Goals - 12/14/20 1351      PT SHORT TERM GOAL #1   Title Patient will be indepdenet with HEP for Phase I rehab protcol    Time 3    Period Weeks    Status New    Target Date 01/04/21      PT SHORT TERM GOAL #2   Title Patient will demonstrate improved ambulation as evidenced by distance of 150 ft during    Baseline 50 ft with RW and CAM boot    Time 3    Period Weeks    Status New    Target Date 01/04/21      PT SHORT TERM GOAL #3   Title Patient will meet criteria for initiating Phase II of Achilles repair protocol to improve functional status    Baseline Phase 1    Time 3    Period Weeks    Status New    Target Date 01/04/21             PT Long Term Goals - 12/14/20 1354      PT LONG TERM GOAL #1   Title Patient will demonstrate independence with complex HEP for self-progression with rehab protocol    Time 12    Period Weeks    Status New    Target Date 03/08/21      PT LONG TERM GOAL #2   Title Patient will demonstrate independent and normalized gait pattern achieving 350 ft distance during    Baseline 50 ft with RW and CAM boot    Time 12    Period Weeks    Status New    Target Date 03/08/21  Plan - 12/14/20 1345    Clinical Impression Statement Patient presents with limited ROM/ankle strength due to post-op presentation and per rehab protocol and demonstrates difficulty in walking, limited activity tolerance, and unsteadiness on feet indicating PT services to improve foot/ankle ROM, strength, and activities/training to normalize  gait pattern and restore abilities to PLOF to facilitate safe return to work and leisure activities with reduce dysfunction, pain, and risk for re-injury    Personal Factors and Comorbidities Comorbidity 1;Profession;Time since onset of injury/illness/exacerbation    Comorbidities left achilles tendinopathy    Examination-Activity Limitations Carry;Lift;Stand;Stairs;Squat;Locomotion Level;Transfers    Examination-Participation Restrictions Occupation;Community Activity    Stability/Clinical Decision Making Stable/Uncomplicated    Clinical Decision Making Low    Rehab Potential Good    PT Frequency 1x / week   patient reports financial burden limiting her ability to attend the clinic regularly   PT Duration 12 weeks    PT Treatment/Interventions Therapeutic exercise;Therapeutic activities;Gait training;Stair training;Manual techniques;ADLs/Self Care Home Management;Aquatic Therapy    PT Next Visit Plan Continue with phase I Achilles protocol, gentle ROM/stretching, open chain strengthening, gait training    PT Home Exercise Plan towel scrunch, ankle DF, Ankle ABC's, gentle calf stretch with towel           Patient will benefit from skilled therapeutic intervention in order to improve the following deficits and impairments:  Abnormal gait,Decreased activity tolerance,Decreased balance,Decreased mobility,Decreased knowledge of use of DME,Decreased knowledge of precautions,Decreased endurance,Decreased range of motion,Decreased strength,Increased edema,Difficulty walking,Impaired perceived functional ability  Visit Diagnosis: Difficulty in walking, not elsewhere classified  Unsteadiness on feet     Problem List Patient Active Problem List   Diagnosis Date Noted  . Encounter for general adult medical examination with abnormal findings 10/24/2020  . Other fatigue 10/24/2020  . Inflammatory polyarthropathy ([place]) 10/24/2020  . B12 deficiency 10/24/2020  . Encounter for screening mammogram  for malignant neoplasm of breast 10/24/2020  . Dysuria 10/24/2020  . Acute recurrent pansinusitis 11/09/2019  . Mitral valve disorder 11/09/2019  . Carpal tunnel syndrome of right wrist 07/22/2019  . Dysplastic nevus syndrome 10/09/2017  . Primary osteoarthritis of left knee 04/06/2016  . Chronic bilateral low back pain with bilateral sciatica 07/15/2015  . Patellar maltracking 04/25/2015  . Reflex sympathetic dystrophy 04/13/2015  . Acquired lymphedema of lower extremity 11/10/2014  . Hyperlipidemia 11/10/2014  . Chronic venous insufficiency 11/09/2014  . Mild intermittent asthma 10/21/2014  . Patellofemoral joint pain 10/21/2014  . Radiculopathy, lumbar region 10/12/2014  . Bilateral lower extremity edema 10/01/2014  . Myoclonus 04/07/2014  . Pain of upper extremity 06/19/2013  . Chest pain 04/28/2013  . Encounter for therapeutic drug monitoring 08/05/2012  . Opioid dependence with physiological dependence ([place]) 08/05/2012  . Postlaminectomy syndrome, lumbar region 08/05/2012  . COPD (chronic obstructive pulmonary disease) ([place]) 06/20/2012  . Essential hypertension 06/20/2012  . Panic attack 06/20/2012  . Uncontrolled type 2 diabetes mellitus with hyperglycemia ([place]) 06/20/2012  . Hypoglycemia 08/02/2007  . Anxiety 08/02/2007  . PANIC DISORDER 08/02/2007  . ASTHMA 08/02/2007  . Menopausal symptoms 08/02/2007  . FIBROMYALGIA 08/02/2007  . Abnormal liver function tests 08/02/2007  . TOBACCO ABUSE, HX OF 08/02/2007  . Asthma 08/02/2007   2:03 PM, 12/14/20 M. Sherlyn Lees, PT, DPT Physical Therapist- [place] Office Number: [phone]  [place] [address] [place], [place], [address] Phone: [phone]   Fax:  [phone]  Name: [name] [name] MRN: [id number] Date of Birth: Sep 12, 1976

## 2020-12-14 NOTE — Patient Instructions (Signed)
Access Code: [email or web address] URL: [email or web address] Date: 12/14/2020 Prepared by: Shary Decamp  Exercises Seated Toe Towel Scrunches - 1 x daily - 7 x weekly - 3 sets - 10 reps Seated Toe Raise - 1 x daily - 7 x weekly - 3 sets - 10 reps - 2 hold Seated Ankle Alphabet - 1 x daily - 7 x weekly - 3 sets - 10 reps Long Sitting Calf Stretch with Strap - 3 x daily - 7 x weekly - 3 sets - 3 reps - 60 sec hold

## 2020-12-17 ENCOUNTER — Other Ambulatory Visit: Payer: Self-pay | Admitting: Internal Medicine

## 2020-12-17 DIAGNOSIS — F339 Major depressive disorder, recurrent, unspecified: Secondary | ICD-10-CM

## 2020-12-20 ENCOUNTER — Other Ambulatory Visit: Payer: Self-pay

## 2020-12-20 MED ORDER — PANTOPRAZOLE SODIUM 40 MG PO TBEC
40.0000 mg | DELAYED_RELEASE_TABLET | Freq: Every day | ORAL | 1 refills | Status: DC
Start: 2020-12-20 — End: 2021-03-28

## 2020-12-25 DIAGNOSIS — Z79899 Other long term (current) drug therapy: Secondary | ICD-10-CM | POA: Diagnosis not present

## 2020-12-25 DIAGNOSIS — M545 Low back pain, unspecified: Secondary | ICD-10-CM | POA: Diagnosis not present

## 2020-12-25 DIAGNOSIS — G8929 Other chronic pain: Secondary | ICD-10-CM | POA: Diagnosis not present

## 2020-12-25 DIAGNOSIS — M25562 Pain in left knee: Secondary | ICD-10-CM | POA: Diagnosis not present

## 2020-12-25 DIAGNOSIS — M62838 Other muscle spasm: Secondary | ICD-10-CM | POA: Diagnosis not present

## 2021-01-03 ENCOUNTER — Telehealth: Payer: Self-pay

## 2021-01-03 NOTE — Telephone Encounter (Signed)
Pt needs to be seen in am// virtual

## 2021-01-04 ENCOUNTER — Encounter: Payer: Self-pay | Admitting: Podiatry

## 2021-01-04 ENCOUNTER — Encounter: Payer: Self-pay | Admitting: Internal Medicine

## 2021-01-04 ENCOUNTER — Ambulatory Visit (INDEPENDENT_AMBULATORY_CARE_PROVIDER_SITE_OTHER): Payer: Medicare Other | Admitting: Podiatry

## 2021-01-04 ENCOUNTER — Ambulatory Visit (INDEPENDENT_AMBULATORY_CARE_PROVIDER_SITE_OTHER): Payer: Medicare Other | Admitting: Internal Medicine

## 2021-01-04 ENCOUNTER — Other Ambulatory Visit: Payer: Self-pay

## 2021-01-04 VITALS — Resp 16 | Ht 61.0 in | Wt 165.0 lb

## 2021-01-04 DIAGNOSIS — Z9889 Other specified postprocedural states: Secondary | ICD-10-CM

## 2021-01-04 DIAGNOSIS — S86011A Strain of right Achilles tendon, initial encounter: Secondary | ICD-10-CM

## 2021-01-04 DIAGNOSIS — J300 Vasomotor rhinitis: Secondary | ICD-10-CM | POA: Diagnosis not present

## 2021-01-04 DIAGNOSIS — J0100 Acute maxillary sinusitis, unspecified: Secondary | ICD-10-CM

## 2021-01-04 MED ORDER — AMOXICILLIN-POT CLAVULANATE 875-125 MG PO TABS: 1.0000 | ORAL_TABLET | Freq: Two times a day (BID) | ORAL | 0 refills | Status: DC

## 2021-01-04 NOTE — Progress Notes (Signed)
[facility] [place], [place] [address]  Internal MEDICINE  Telephone Visit  Patient Name: [name] [name]  [id number]  [id number]  Date of Service: 01/09/2021  I connected with the patient at 951 am by telephone and verified the patients identity using two identifiers.   I discussed the limitations, risks, security and privacy concerns of performing an evaluation and management service by telephone and the availability of in person appointments. I also discussed with the patient that there may be a patient responsible charge related to the service.  The patient expressed understanding and agrees to proceed.    Chief Complaint  Patient presents with  . Telephone Screen    PT contact number [phone]  . telephone assement  . Headache  . Fatigue  . Nasal Congestion  . Facial Pain    Pressure around eyes  . Medication Refill    HPI  Pt is seen for acute and sick visit. She has been sick for few days, has not been vaccinated, denies any exposure to COVID 19 Denies any fever or chills, admits nasal congestion, facial pain and headache  Current Medication: Outpatient Encounter Medications as of 01/04/2021  Medication Sig  . amoxicillin-clavulanate (AUGMENTIN) 875-125 MG tablet Take 1 tablet by mouth 2 (two) times daily.  Marland Kitchen ACCU-CHEK AVIVA PLUS test strip   . Accu-Chek Softclix Lancets lancets Use as instructed. DX E11.65  . albuterol (VENTOLIN HFA) 108 (90 Base) MCG/ACT inhaler Inhale into the lungs.  Marland Kitchen ammonium lactate (LAC-HYDRIN) 12 % lotion Apply 1 application topically as needed for dry skin.  . cetirizine (ZYRTEC) 10 MG tablet Take by mouth.  . Cholecalciferol 25 MCG (1000 UT) tablet Take by mouth.  . conjugated estrogens (PREMARIN) vaginal cream Place 1 Applicatorful vaginally daily. Twice a week  . cyclobenzaprine (FLEXERIL) 10 MG tablet Take 10 mg by mouth 3 (three) times daily as needed for muscle spasms.  Mariane Baumgarten Sodium (DSS) 100 MG  CAPS  (Patient not taking: Reported on 10/04/2020)  . FARXIGA 10 MG TABS tablet TAKE 1 TABLET BY MOUTH EVERY DAY  . fenofibrate (TRICOR) 145 MG tablet Take by mouth.  . fluconazole (DIFLUCAN) 150 MG tablet Take 1 tablet po once. May repeat dose in 3 days as needed for persistent symptoms.  Marland Kitchen FLUoxetine (PROZAC) 20 MG capsule TAKE 1 CAPSULE BY MOUTH EVERY DAY  . fluticasone (FLONASE) 50 MCG/ACT nasal spray Place 2 sprays into both nostrils daily.  . furosemide (LASIX) 20 MG tablet Take 20 mg by mouth. Take 1/2 tab as needed  . HYDROmorphone (DILAUDID) 4 MG tablet Take by mouth every 6 (six) hours as needed for severe pain.  Marland Kitchen ibuprofen (ADVIL) 800 MG tablet Take 1 tablet (800 mg total) by mouth every 6 (six) hours as needed.  . insulin glargine, 2 Unit Dial, (TOUJEO MAX SOLOSTAR) 300 UNIT/ML Solostar Pen Inject 35 Units into the skin daily.  . Insulin Pen Needle 31G X 5 MM MISC Use as directed  Once a day Dx e11.65  . Ipratropium-Albuterol (COMBIVENT RESPIMAT) 20-100 MCG/ACT AERS respimat Inhale 1 puff into the lungs every 6 (six) hours.  Marland Kitchen levothyroxine (SYNTHROID) 50 MCG tablet TAKE 1 TABLET BY MOUTH EVERY DAY  . meloxicam (MOBIC) 15 MG tablet Take 15 mg by mouth daily.  . Methylnaltrexone Bromide (RELISTOR) 150 MG TABS Take 3 tablets by mouth.  . morphine (MS CONTIN) 60 MG 12 hr tablet Take 60 mg by mouth every 12 (twelve) hours.  . Multiple  Vitamin (MULTI-VITAMIN) tablet Take 1 tablet by mouth daily.  . naloxegol oxalate (MOVANTIK) 12.5 MG TABS tablet TAKE 1 TABLET(S) BY MOUTH DAILY ON EMPTY STOMACH 1 HOUR BEFORE OR 2 HOURS AFTER FIRST MEAL. (Patient not taking: Reported on 10/04/2020)  . pantoprazole (PROTONIX) 40 MG tablet Take 1 tablet (40 mg total) by mouth daily.  . pravastatin (PRAVACHOL) 10 MG tablet Take 1 tablet (10 mg total) by mouth daily.  . pregabalin (LYRICA) 150 MG capsule Take 150 mg by mouth 3 (three) times daily.   . promethazine (PHENERGAN) 25 MG tablet Take 1 tablet (25 mg  total) by mouth every 6 (six) hours as needed for nausea or vomiting.  . SUMAtriptan (IMITREX) 100 MG tablet Take 100 mg by mouth every 2 (two) hours as needed for migraine. May repeat in 2 hours if headache persists or recurs.  . triamcinolone ointment (KENALOG) 0.1 % Apply to affected area daily for two weeks  . VICTOZA 18 MG/3ML SOPN INJECT 1.8 MG UNDER THE SKIN ONCE DAILY  . Vitamin D, Ergocalciferol, (DRISDOL) 1.25 MG (50000 UNIT) CAPS capsule TAKE 1 CAPSULE (50,000 UNITS TOTAL) BY MOUTH EVERY 7 (SEVEN) DAYS.  . [DISCONTINUED] amoxicillin-clavulanate (AUGMENTIN) 875-125 MG tablet Take 1 tablet by mouth 2 (two) times daily.   No facility-administered encounter medications on file as of 01/04/2021.    Surgical History: Past Surgical History:  Procedure Laterality Date  . ABDOMINAL HYSTERECTOMY    . CARPAL TUNNEL RELEASE    . KNEE SURGERY    . SPINAL FUSION    . SPINAL FUSION      Medical History: Past Medical History:  Diagnosis Date  . Actinic keratitis   . Allergy   . Anxiety   . Asthma   . Basal cell carcinoma   . Chronic venous insufficiency   . DDD (degenerative disc disease), lumbar   . Diabetes mellitus without complication ([place])   . Fibromyalgia   . Fibromyalgia   . Hyperlipidemia   . Lymphedema   . Melanoma ([place])   . Migraine   . Mitral valve prolapse   . Tendonitis of foot    left    Family History: Family History  Problem Relation Age of Onset  . Cancer Paternal Aunt   . Cancer Paternal Uncle   . Diabetes Paternal Grandfather   . Heart disease Paternal Grandfather     Social History   Socioeconomic History  . Marital status: Divorced    Spouse name: Not on file  . Number of children: Not on file  . Years of education: Not on file  . Highest education level: Not on file  Occupational History  . Not on file  Tobacco Use  . Smoking status: Never Smoker  . Smokeless tobacco: Never Used  Vaping Use  . Vaping Use: Never used  Substance and  Sexual Activity  . Alcohol use: No  . Drug use: No  . Sexual activity: Not on file  Other Topics Concern  . Not on file  Social History Narrative  . Not on file   Social Determinants of Health   Financial Resource Strain: Not on file  Food Insecurity: Not on file  Transportation Needs: Not on file  Physical Activity: Not on file  Stress: Not on file  Social Connections: Not on file  Intimate Partner Violence: Not on file      Review of Systems  Constitutional: Negative for fatigue and fever.  HENT: Positive for facial swelling and sinus pain. Negative for  congestion, mouth sores and postnasal drip.   Respiratory: Negative for cough.   Cardiovascular: Negative for chest pain.  Genitourinary: Negative for flank pain.  Neurological: Positive for headaches.  Psychiatric/Behavioral: Negative.     Vital Signs: Resp 16   Ht 5\' 1"  (1.549 m)   Wt 165 lb (74.8 kg)   LMP 12/13/1991   BMI 31.18 kg/m    Observation/Objective: Nasal congestion during conversation     Assessment/Plan: 1. Acute non-recurrent maxillary sinusitis Will start therapy, monitor symptoms, use saline spray otc - amoxicillin-clavulanate (AUGMENTIN) 875-125 MG tablet; Take 1 tablet by mouth 2 (two) times daily.  Dispense: 20 tablet; Refill: 0  2. Vasomotor rhinitis Add Flonase otc 2 sq bid   General Counseling: Meli verbalizes understanding of the findings of today's phone visit and agrees with plan of treatment. I have discussed any further diagnostic evaluation that may be needed or ordered today. We also reviewed her medications today. she has been encouraged to call the office with any questions or concerns that should arise related to todays visit.   Meds ordered this encounter  Medications  . amoxicillin-clavulanate (AUGMENTIN) 875-125 MG tablet    Sig: Take 1 tablet by mouth 2 (two) times daily.    Dispense:  20 tablet    Refill:  0    Time spent:15 Minutes    Dr Lavera Guise Internal medicine

## 2021-01-04 NOTE — Progress Notes (Signed)
Subjective:  Patient ID: [name] [name], female    DOB: [birth year],  MRN: [id number]  Chief Complaint  Patient presents with  . Routine Post Op    DOS 11/01/2020 RT ACHILLES TENDON REPAIR, GASTROCNEMIUS RECESS   "its better, but still swells and its really tender on the scar.  I can't even put a shoe on because it hurts"     45 y.o. female returns for post-op check.  Patient is doing well.  She has been weightbearing as tolerated in regular shoes without any acute complaints.  She denies any other acute complaints.  She has been able to do for home physical therapy.  Review of Systems: Negative except as noted in the HPI. Denies N/V/F/Ch.  Past Medical History:  Diagnosis Date  . Actinic keratitis   . Allergy   . Anxiety   . Asthma   . Basal cell carcinoma   . Chronic venous insufficiency   . DDD (degenerative disc disease), lumbar   . Diabetes mellitus without complication ([place])   . Fibromyalgia   . Fibromyalgia   . Hyperlipidemia   . Lymphedema   . Melanoma ([place])   . Migraine   . Mitral valve prolapse   . Tendonitis of foot    left    Current Outpatient Medications:  .  ACCU-CHEK AVIVA PLUS test strip, , Disp: , Rfl:  .  Accu-Chek Softclix Lancets lancets, Use as instructed. DX E11.65, Disp: 100 each, Rfl: 3 .  albuterol (VENTOLIN HFA) 108 (90 Base) MCG/ACT inhaler, Inhale into the lungs., Disp: , Rfl:  .  ammonium lactate (LAC-HYDRIN) 12 % lotion, Apply 1 application topically as needed for dry skin., Disp: 400 g, Rfl: 0 .  amoxicillin-clavulanate (AUGMENTIN) 875-125 MG tablet, Take 1 tablet by mouth 2 (two) times daily., Disp: 20 tablet, Rfl: 0 .  cetirizine (ZYRTEC) 10 MG tablet, Take by mouth., Disp: , Rfl:  .  Cholecalciferol 25 MCG (1000 UT) tablet, Take by mouth., Disp: , Rfl:  .  conjugated estrogens (PREMARIN) vaginal cream, Place 1 Applicatorful vaginally daily. Twice a week, Disp: 90 g, Rfl: 1 .  cyclobenzaprine (FLEXERIL) 10 MG tablet, Take 10 mg by  mouth 3 (three) times daily as needed for muscle spasms., Disp: , Rfl:  .  Docusate Sodium (DSS) 100 MG CAPS, , Disp: , Rfl:  .  FARXIGA 10 MG TABS tablet, TAKE 1 TABLET BY MOUTH EVERY DAY, Disp: 30 tablet, Rfl: 5 .  fenofibrate (TRICOR) 145 MG tablet, Take by mouth., Disp: , Rfl:  .  fluconazole (DIFLUCAN) 150 MG tablet, Take 1 tablet po once. May repeat dose in 3 days as needed for persistent symptoms., Disp: 3 tablet, Rfl: 0 .  FLUoxetine (PROZAC) 20 MG capsule, TAKE 1 CAPSULE BY MOUTH EVERY DAY, Disp: 90 capsule, Rfl: 0 .  fluticasone (FLONASE) 50 MCG/ACT nasal spray, Place 2 sprays into both nostrils daily., Disp: , Rfl:  .  furosemide (LASIX) 20 MG tablet, Take 20 mg by mouth. Take 1/2 tab as needed, Disp: , Rfl:  .  HYDROmorphone (DILAUDID) 4 MG tablet, Take by mouth every 6 (six) hours as needed for severe pain., Disp: , Rfl:  .  ibuprofen (ADVIL) 800 MG tablet, Take 1 tablet (800 mg total) by mouth every 6 (six) hours as needed., Disp: 60 tablet, Rfl: 1 .  insulin glargine, 2 Unit Dial, (TOUJEO MAX SOLOSTAR) 300 UNIT/ML Solostar Pen, Inject 35 Units into the skin daily., Disp: 12 mL, Rfl: 1 .  Insulin Pen  Needle 31G X 5 MM MISC, Use as directed  Once a day Dx e11.65, Disp: 100 each, Rfl: 3 .  Ipratropium-Albuterol (COMBIVENT RESPIMAT) 20-100 MCG/ACT AERS respimat, Inhale 1 puff into the lungs every 6 (six) hours., Disp: , Rfl:  .  levothyroxine (SYNTHROID) 50 MCG tablet, TAKE 1 TABLET BY MOUTH EVERY DAY, Disp: 90 tablet, Rfl: 3 .  meloxicam (MOBIC) 15 MG tablet, Take 15 mg by mouth daily., Disp: , Rfl:  .  Methylnaltrexone Bromide (RELISTOR) 150 MG TABS, Take 3 tablets by mouth., Disp: , Rfl:  .  morphine (MS CONTIN) 60 MG 12 hr tablet, Take 60 mg by mouth every 12 (twelve) hours., Disp: , Rfl:  .  Multiple Vitamin (MULTI-VITAMIN) tablet, Take 1 tablet by mouth daily., Disp: , Rfl:  .  naloxegol oxalate (MOVANTIK) 12.5 MG TABS tablet, TAKE 1 TABLET(S) BY MOUTH DAILY ON EMPTY STOMACH 1 HOUR  BEFORE OR 2 HOURS AFTER FIRST MEAL. (Patient not taking: Reported on 10/04/2020), Disp: , Rfl:  .  pantoprazole (PROTONIX) 40 MG tablet, Take 1 tablet (40 mg total) by mouth daily., Disp: 90 tablet, Rfl: 1 .  pravastatin (PRAVACHOL) 10 MG tablet, Take 1 tablet (10 mg total) by mouth daily., Disp: 90 tablet, Rfl: 2 .  pregabalin (LYRICA) 150 MG capsule, Take 150 mg by mouth 3 (three) times daily. , Disp: , Rfl:  .  promethazine (PHENERGAN) 25 MG tablet, Take 1 tablet (25 mg total) by mouth every 6 (six) hours as needed for nausea or vomiting., Disp: 30 tablet, Rfl: 0 .  SUMAtriptan (IMITREX) 100 MG tablet, Take 100 mg by mouth every 2 (two) hours as needed for migraine. May repeat in 2 hours if headache persists or recurs., Disp: , Rfl:  .  triamcinolone ointment (KENALOG) 0.1 %, Apply to affected area daily for two weeks, Disp: , Rfl:  .  VICTOZA 18 MG/3ML SOPN, INJECT 1.8 MG UNDER THE SKIN ONCE DAILY, Disp: 3 pen, Rfl: 3 .  Vitamin D, Ergocalciferol, (DRISDOL) 1.25 MG (50000 UNIT) CAPS capsule, TAKE 1 CAPSULE (50,000 UNITS TOTAL) BY MOUTH EVERY 7 (SEVEN) DAYS., Disp: 8 capsule, Rfl: 0  Social History   Tobacco Use  Smoking Status Never Smoker  Smokeless Tobacco Never Used    Allergies  Allergen Reactions  . Oxycodone Hcl Nausea And Vomiting  . Pollen Extract Other (See Comments)    Congestion, sneezing Congestion, sneezing   . Sulfa Antibiotics Other (See Comments)  . Topiramate Other (See Comments)    Jerking  Jerking  Jerking  Other reaction(s): Other (See Comments) Jerking    . Bee Venom Hives, Itching and Swelling  . Crestor [Rosuvastatin] Other (See Comments)    Muscle spasms  . Doxycycline Nausea Only  . Oxycontin [Oxycodone] Nausea Only   Objective:   There were no vitals filed for this visit. There is no height or weight on file to calculate BMI. Constitutional Well developed. Well nourished.  Vascular Foot warm and well perfused. Capillary refill normal to all  digits.   Neurologic Normal speech. Oriented to person, place, and time. Epicritic sensation to light touch grossly present bilaterally.  Dermatologic  greater than 90 degrees of ankle joint range of motion noted.  5 out of 5 manual muscle strength of dorsiflexion plantarflexion of the ankle noted.  Orthopedic:  Now tenderness to palpation noted about the surgical site.   Radiographs: None Assessment:   1. Achilles tendon rupture, right, initial encounter   2. Status post foot surgery  Plan:  Patient was evaluated and treated and all questions answered.  S/p foot surgery right -Progressing as expected post-operatively. -XR: None -WB Status: Weightbearing as tolerated in regular shoes. -Sutures/staples: Removed no signs of dehiscence noted no clinical signs of infection noted -Medications: None -Ace bandage was applied -Patient has been doing home physical therapy.  She has got great improvement in range of motion.  At this time given that patient has clinically improved considerably and has been able to transition to regular shoes without any issues she is officially discharged from my care if any foot and ankle issues arise in the future I have asked her to come back and see me.  She states understanding  No follow-ups on file.

## 2021-01-14 ENCOUNTER — Other Ambulatory Visit: Payer: Self-pay

## 2021-01-14 MED ORDER — VICTOZA 18 MG/3ML ~~LOC~~ SOPN
PEN_INJECTOR | SUBCUTANEOUS | 3 refills | Status: DC
Start: 2021-01-14 — End: 2021-01-17

## 2021-01-17 ENCOUNTER — Ambulatory Visit (INDEPENDENT_AMBULATORY_CARE_PROVIDER_SITE_OTHER): Payer: Medicare Other | Admitting: Physician Assistant

## 2021-01-17 ENCOUNTER — Encounter: Payer: Self-pay | Admitting: Physician Assistant

## 2021-01-17 ENCOUNTER — Other Ambulatory Visit: Payer: Self-pay

## 2021-01-17 DIAGNOSIS — E1165 Type 2 diabetes mellitus with hyperglycemia: Secondary | ICD-10-CM

## 2021-01-17 DIAGNOSIS — F339 Major depressive disorder, recurrent, unspecified: Secondary | ICD-10-CM

## 2021-01-17 DIAGNOSIS — F411 Generalized anxiety disorder: Secondary | ICD-10-CM | POA: Diagnosis not present

## 2021-01-17 DIAGNOSIS — E559 Vitamin D deficiency, unspecified: Secondary | ICD-10-CM | POA: Diagnosis not present

## 2021-01-17 DIAGNOSIS — M7662 Achilles tendinitis, left leg: Secondary | ICD-10-CM

## 2021-01-17 DIAGNOSIS — M064 Inflammatory polyarthropathy: Secondary | ICD-10-CM

## 2021-01-17 DIAGNOSIS — M7661 Achilles tendinitis, right leg: Secondary | ICD-10-CM

## 2021-01-17 DIAGNOSIS — E538 Deficiency of other specified B group vitamins: Secondary | ICD-10-CM

## 2021-01-17 LAB — POCT GLYCOSYLATED HEMOGLOBIN (HGB A1C): Hemoglobin A1C: 6.5 % — AB (ref 4.0–5.6)

## 2021-01-17 MED ORDER — INSULIN PEN NEEDLE 31G X 5 MM MISC: 3 refills | Status: DC

## 2021-01-17 MED ORDER — CHOLECALCIFEROL 25 MCG (1000 UT) PO TABS: 1000.0000 [IU] | ORAL_TABLET | ORAL | 0 refills | Status: AC

## 2021-01-17 MED ORDER — CYANOCOBALAMIN 1000 MCG/ML IJ SOLN
1000.0000 ug | Freq: Once | INTRAMUSCULAR | Status: AC
  Administered 2021-01-17: 1000 ug via INTRAMUSCULAR

## 2021-01-17 MED ORDER — FLUOXETINE HCL 40 MG PO CAPS: 40.0000 mg | ORAL_CAPSULE | Freq: Every day | ORAL | 0 refills | Status: DC

## 2021-01-17 MED ORDER — VICTOZA 18 MG/3ML ~~LOC~~ SOPN
PEN_INJECTOR | SUBCUTANEOUS | 3 refills | Status: DC
Start: 2021-01-17 — End: 2021-02-28

## 2021-01-17 NOTE — Progress Notes (Signed)
[facility] [place], [place] [address]  Internal MEDICINE  Office Visit Note  Patient Name: [name] [name]  [id number]  [id number]  Date of Service: 01/21/2021  Chief Complaint  Patient presents with  . Diabetes  . Hypertension    HPI Pt returns for follow up of HTN and diabetes. BP has been well controlled without medication. Diabetes also well controlled with blood glucose numbers in 130-140 range. She states the Toujeo is very hard for her to get, Toujeo sample given. She does report her anxiety has been poorly controlled since losing her mom. She was supposed to be taking 40mg  prozac but only had 20mg  prescription sent. She reports tendonitis in L ankle and then found out it was in R, and ruptured her R achilles. She also has arthritis all over. RF and ANA were done previously and were negative. She has tried cymbalta previously and it helped with her pain but mentally she did not feel she was all there. She would rather stay on prozac.  Current Medication: Outpatient Encounter Medications as of 01/17/2021  Medication Sig  . ACCU-CHEK AVIVA PLUS test strip   . Accu-Chek Softclix Lancets lancets Use as instructed. DX E11.65  . albuterol (VENTOLIN HFA) 108 (90 Base) MCG/ACT inhaler Inhale into the lungs.  Marland Kitchen ammonium lactate (LAC-HYDRIN) 12 % lotion Apply 1 application topically as needed for dry skin.  . cetirizine (ZYRTEC) 10 MG tablet Take by mouth.  . conjugated estrogens (PREMARIN) vaginal cream Place 1 Applicatorful vaginally daily. Twice a week  . cyclobenzaprine (FLEXERIL) 10 MG tablet Take 10 mg by mouth 3 (three) times daily as needed for muscle spasms.  . fenofibrate (TRICOR) 145 MG tablet Take by mouth.  . fluticasone (FLONASE) 50 MCG/ACT nasal spray Place 2 sprays into both nostrils daily.  . furosemide (LASIX) 20 MG tablet Take 20 mg by mouth. Take 1/2 tab as needed  . HYDROmorphone (DILAUDID) 4 MG tablet Take by mouth every 6 (six) hours as  needed for severe pain.  Marland Kitchen ibuprofen (ADVIL) 800 MG tablet Take 1 tablet (800 mg total) by mouth every 6 (six) hours as needed.  . insulin glargine, 2 Unit Dial, (TOUJEO MAX SOLOSTAR) 300 UNIT/ML Solostar Pen Inject 35 Units into the skin daily.  . Ipratropium-Albuterol (COMBIVENT RESPIMAT) 20-100 MCG/ACT AERS respimat Inhale 1 puff into the lungs every 6 (six) hours.  Marland Kitchen levothyroxine (SYNTHROID) 50 MCG tablet TAKE 1 TABLET BY MOUTH EVERY DAY  . Methylnaltrexone Bromide (RELISTOR) 150 MG TABS Take 3 tablets by mouth.  . morphine (MS CONTIN) 60 MG 12 hr tablet Take 60 mg by mouth every 12 (twelve) hours.  . Multiple Vitamin (MULTI-VITAMIN) tablet Take 1 tablet by mouth daily.  . naloxegol oxalate (MOVANTIK) 12.5 MG TABS tablet TAKE 1 TABLET(S) BY MOUTH DAILY ON EMPTY STOMACH 1 HOUR BEFORE OR 2 HOURS AFTER FIRST MEAL.  . pantoprazole (PROTONIX) 40 MG tablet Take 1 tablet (40 mg total) by mouth daily.  . pravastatin (PRAVACHOL) 10 MG tablet Take 1 tablet (10 mg total) by mouth daily.  . pregabalin (LYRICA) 150 MG capsule Take 150 mg by mouth 3 (three) times daily.   . promethazine (PHENERGAN) 25 MG tablet Take 1 tablet (25 mg total) by mouth every 6 (six) hours as needed for nausea or vomiting.  . SUMAtriptan (IMITREX) 100 MG tablet Take 100 mg by mouth every 2 (two) hours as needed for migraine. May repeat in 2 hours if headache persists or recurs.  Marland Kitchen  triamcinolone ointment (KENALOG) 0.1 % Apply to affected area daily for two weeks  . Vitamin D, Ergocalciferol, (DRISDOL) 1.25 MG (50000 UNIT) CAPS capsule TAKE 1 CAPSULE (50,000 UNITS TOTAL) BY MOUTH EVERY 7 (SEVEN) DAYS.  . [DISCONTINUED] amoxicillin-clavulanate (AUGMENTIN) 875-125 MG tablet Take 1 tablet by mouth 2 (two) times daily.  . [DISCONTINUED] Cholecalciferol 25 MCG (1000 UT) tablet Take by mouth.  . [DISCONTINUED] FARXIGA 10 MG TABS tablet TAKE 1 TABLET BY MOUTH EVERY DAY  . [DISCONTINUED] FLUoxetine (PROZAC) 20 MG capsule TAKE 1 CAPSULE BY  MOUTH EVERY DAY  . [DISCONTINUED] FLUoxetine (PROZAC) 40 MG capsule Take 1 capsule (40 mg total) by mouth daily.  . [DISCONTINUED] Insulin Pen Needle 31G X 5 MM MISC Use as directed  Once a day Dx e11.65  . [DISCONTINUED] liraglutide (VICTOZA) 18 MG/3ML SOPN INJECT 1.8 MG UNDER THE SKIN ONCE DAILY  . Cholecalciferol 25 MCG (1000 UT) tablet Take 1 tablet (1,000 Units total) by mouth once a week.  . dapagliflozin propanediol (FARXIGA) 10 MG TABS tablet Take 1 tablet (10 mg total) by mouth daily.  . Insulin Pen Needle 31G X 5 MM MISC Use as directed  Once a day Dx e11.65  . liraglutide (VICTOZA) 18 MG/3ML SOPN INJECT 1.8 MG UNDER THE SKIN ONCE DAILY  . [DISCONTINUED] Docusate Sodium (DSS) 100 MG CAPS  (Patient not taking: Reported on 01/17/2021)  . [DISCONTINUED] fluconazole (DIFLUCAN) 150 MG tablet Take 1 tablet po once. May repeat dose in 3 days as needed for persistent symptoms. (Patient not taking: Reported on 01/17/2021)  . [DISCONTINUED] meloxicam (MOBIC) 15 MG tablet Take 15 mg by mouth daily. (Patient not taking: Reported on 01/17/2021)  . [EXPIRED] cyanocobalamin ((VITAMIN B-12)) injection 1,000 mcg    No facility-administered encounter medications on file as of 01/17/2021.    Surgical History: Past Surgical History:  Procedure Laterality Date  . ABDOMINAL HYSTERECTOMY    . CARPAL TUNNEL RELEASE    . KNEE SURGERY    . SPINAL FUSION    . SPINAL FUSION      Medical History: Past Medical History:  Diagnosis Date  . Actinic keratitis   . Allergy   . Anxiety   . Asthma   . Basal cell carcinoma   . Chronic venous insufficiency   . DDD (degenerative disc disease), lumbar   . Diabetes mellitus without complication ([place])   . Fibromyalgia   . Fibromyalgia   . Hyperlipidemia   . Lymphedema   . Melanoma ([place])   . Migraine   . Mitral valve prolapse   . Tendonitis of foot    left    Family History: Family History  Problem Relation Age of Onset  . Cancer Paternal Aunt   . Cancer  Paternal Uncle   . Diabetes Paternal Grandfather   . Heart disease Paternal Grandfather     Social History   Socioeconomic History  . Marital status: Divorced    Spouse name: Not on file  . Number of children: Not on file  . Years of education: Not on file  . Highest education level: Not on file  Occupational History  . Not on file  Tobacco Use  . Smoking status: Never Smoker  . Smokeless tobacco: Never Used  Vaping Use  . Vaping Use: Never used  Substance and Sexual Activity  . Alcohol use: No  . Drug use: No  . Sexual activity: Not on file  Other Topics Concern  . Not on file  Social History Narrative  .  Not on file   Social Determinants of Health   Financial Resource Strain: Not on file  Food Insecurity: Not on file  Transportation Needs: Not on file  Physical Activity: Not on file  Stress: Not on file  Social Connections: Not on file  Intimate Partner Violence: Not on file      Review of Systems  Constitutional: Negative for chills, fatigue and unexpected weight change.  HENT: Negative for congestion, postnasal drip, rhinorrhea, sneezing and sore throat.   Eyes: Negative for redness.  Respiratory: Negative for cough, chest tightness and shortness of breath.   Cardiovascular: Negative for chest pain and palpitations.  Gastrointestinal: Negative for abdominal pain, constipation, diarrhea, nausea and vomiting.  Genitourinary: Negative for dysuria and frequency.  Musculoskeletal: Positive for arthralgias. Negative for back pain, joint swelling and neck pain.       Recurrent tendonitis and diffuse joint pain  Skin: Negative for rash.  Neurological: Negative.  Negative for tremors and numbness.  Hematological: Negative for adenopathy. Does not bruise/bleed easily.  Psychiatric/Behavioral: Positive for behavioral problems (Depression). Negative for sleep disturbance and suicidal ideas. The patient is nervous/anxious.     Vital Signs: BP 112/66   Pulse 84    Temp 97.8 F (36.6 C)   Resp 16   Ht 5\' 1"  (1.549 m)   Wt 174 lb (78.9 kg)   LMP 12/13/1991   SpO2 96%   BMI 32.88 kg/m    Physical Exam Constitutional:      General: She is not in acute distress.    Appearance: She is well-developed. She is obese. She is not diaphoretic.  HENT:     Head: Normocephalic and atraumatic.     Mouth/Throat:     Pharynx: No oropharyngeal exudate.  Eyes:     Pupils: Pupils are equal, round, and reactive to light.  Neck:     Thyroid: No thyromegaly.     Vascular: No JVD.     Trachea: No tracheal deviation.  Cardiovascular:     Rate and Rhythm: Normal rate and regular rhythm.     Heart sounds: Normal heart sounds. No murmur heard. No friction rub. No gallop.   Pulmonary:     Effort: Pulmonary effort is normal. No respiratory distress.     Breath sounds: No wheezing or rales.  Chest:     Chest wall: No tenderness.  Abdominal:     General: Bowel sounds are normal.     Palpations: Abdomen is soft.  Musculoskeletal:        General: Tenderness present. Normal range of motion.     Cervical back: Normal range of motion and neck supple.     Comments: Diffuse joint pain and tendonitis in achillles  Lymphadenopathy:     Cervical: No cervical adenopathy.  Skin:    General: Skin is warm and dry.  Neurological:     Mental Status: She is alert and oriented to person, place, and time.     Cranial Nerves: No cranial nerve deficit.  Psychiatric:        Behavior: Behavior normal.        Thought Content: Thought content normal.        Judgment: Judgment normal.        Assessment/Plan: 1. Uncontrolled type 2 diabetes mellitus with hyperglycemia (HCC) - POCT HgB A1C 6.5 today. Well controlled. Continue Victoza and Farxiga as prescribed. Toujeo sample given - liraglutide (VICTOZA) 18 MG/3ML SOPN; INJECT 1.8 MG UNDER THE SKIN ONCE DAILY  Dispense: 3 mL;  Refill: 3 - Insulin Pen Needle 31G X 5 MM MISC; Use as directed  Once a day Dx e11.65  Dispense: 100  each; Refill: 3  2. Depression, recurrent ([place]) Well controlled on Prozac. Previously told to increase dose to take 2 tabs of the 20mg  daily until she ran out and then switch to 40mg  tab. New script sent. - FLUoxetine (PROZAC) 40 MG capsule; Take 1 capsule (40 mg total) by mouth daily.  Dispense: 90 capsule; Refill: 0  3. GAD (generalized anxiety disorder) - FLUoxetine (PROZAC) 40 MG capsule; Take 1 capsule (40 mg total) by mouth daily.  Dispense: 90 capsule; Refill: 0  4. Inflammatory polyarthropathy (HCC) RF and ANA negative. Sed rate normal. Discussed f/u with pain management as well as PT.  5. Achilles tendinitis of both lower extremities Discussed pain management and PT options, but patient declining at this time.  6. B12 deficiency - cyanocobalamin ((VITAMIN B-12)) injection 1,000 mcg  7. Vitamin D deficiency - Cholecalciferol 25 MCG (1000 UT) tablet; Take 1 tablet (1,000 Units total) by mouth once a week.  Dispense: 15 tablet; Refill: 0  General Counseling: Patsye verbalizes understanding of the findings of todays visit and agrees with plan of treatment. I have discussed any further diagnostic evaluation that may be needed or ordered today. We also reviewed her medications today. she has been encouraged to call the office with any questions or concerns that should arise related to todays visit.    Orders Placed This Encounter  Procedures  . POCT HgB A1C    Meds ordered this encounter  Medications  . cyanocobalamin ((VITAMIN B-12)) injection 1,000 mcg  . liraglutide (VICTOZA) 18 MG/3ML SOPN    Sig: INJECT 1.8 MG UNDER THE SKIN ONCE DAILY    Dispense:  3 mL    Refill:  3  . Insulin Pen Needle 31G X 5 MM MISC    Sig: Use as directed  Once a day Dx e11.65    Dispense:  100 each    Refill:  3  . DISCONTD: FLUoxetine (PROZAC) 40 MG capsule    Sig: Take 1 capsule (40 mg total) by mouth daily.    Dispense:  90 capsule    Refill:  0  . Cholecalciferol 25 MCG (1000 UT)  tablet    Sig: Take 1 tablet (1,000 Units total) by mouth once a week.    Dispense:  15 tablet    Refill:  0  . dapagliflozin propanediol (FARXIGA) 10 MG TABS tablet    Sig: Take 1 tablet (10 mg total) by mouth daily.    Dispense:  90 tablet    Refill:  1    Total time spent:30 Minutes Time spent includes review of chart, medications, test results, and follow up plan with the patient.      Dr Lavera Guise Internal medicine

## 2021-01-18 ENCOUNTER — Telehealth: Payer: Self-pay

## 2021-01-18 NOTE — Telephone Encounter (Signed)
LMOM to reschedule Friday afternoon appt. 10-07-21

## 2021-01-19 ENCOUNTER — Other Ambulatory Visit: Payer: Self-pay | Admitting: Physician Assistant

## 2021-01-19 DIAGNOSIS — F411 Generalized anxiety disorder: Secondary | ICD-10-CM

## 2021-01-19 DIAGNOSIS — F339 Major depressive disorder, recurrent, unspecified: Secondary | ICD-10-CM

## 2021-01-21 MED ORDER — DAPAGLIFLOZIN PROPANEDIOL 10 MG PO TABS: 10.0000 mg | ORAL_TABLET | Freq: Every day | ORAL | 1 refills | Status: DC

## 2021-01-26 DIAGNOSIS — M25562 Pain in left knee: Secondary | ICD-10-CM | POA: Diagnosis not present

## 2021-01-26 DIAGNOSIS — G8929 Other chronic pain: Secondary | ICD-10-CM | POA: Diagnosis not present

## 2021-01-26 DIAGNOSIS — M545 Low back pain, unspecified: Secondary | ICD-10-CM | POA: Diagnosis not present

## 2021-01-26 DIAGNOSIS — M62838 Other muscle spasm: Secondary | ICD-10-CM | POA: Diagnosis not present

## 2021-01-26 DIAGNOSIS — Z79899 Other long term (current) drug therapy: Secondary | ICD-10-CM | POA: Diagnosis not present

## 2021-01-28 ENCOUNTER — Other Ambulatory Visit: Payer: Self-pay | Admitting: Adult Health

## 2021-02-01 ENCOUNTER — Other Ambulatory Visit: Payer: Self-pay | Admitting: Adult Health

## 2021-02-01 DIAGNOSIS — E782 Mixed hyperlipidemia: Secondary | ICD-10-CM

## 2021-02-16 DIAGNOSIS — M545 Low back pain, unspecified: Secondary | ICD-10-CM | POA: Diagnosis not present

## 2021-02-16 DIAGNOSIS — G8929 Other chronic pain: Secondary | ICD-10-CM | POA: Diagnosis not present

## 2021-02-16 DIAGNOSIS — M25562 Pain in left knee: Secondary | ICD-10-CM | POA: Diagnosis not present

## 2021-02-16 DIAGNOSIS — M62838 Other muscle spasm: Secondary | ICD-10-CM | POA: Diagnosis not present

## 2021-02-16 DIAGNOSIS — Z79899 Other long term (current) drug therapy: Secondary | ICD-10-CM | POA: Diagnosis not present

## 2021-02-28 ENCOUNTER — Other Ambulatory Visit: Payer: Self-pay | Admitting: Internal Medicine

## 2021-02-28 DIAGNOSIS — E1165 Type 2 diabetes mellitus with hyperglycemia: Secondary | ICD-10-CM

## 2021-03-09 ENCOUNTER — Ambulatory Visit: Payer: Medicare Other

## 2021-03-24 ENCOUNTER — Other Ambulatory Visit: Payer: Self-pay | Admitting: Podiatry

## 2021-03-24 ENCOUNTER — Other Ambulatory Visit: Payer: Self-pay | Admitting: Physician Assistant

## 2021-03-24 DIAGNOSIS — M1712 Unilateral primary osteoarthritis, left knee: Secondary | ICD-10-CM | POA: Diagnosis not present

## 2021-03-24 DIAGNOSIS — E1165 Type 2 diabetes mellitus with hyperglycemia: Secondary | ICD-10-CM

## 2021-03-24 NOTE — Telephone Encounter (Signed)
Please advise 

## 2021-03-27 ENCOUNTER — Other Ambulatory Visit: Payer: Self-pay | Admitting: Internal Medicine

## 2021-03-30 DIAGNOSIS — M62838 Other muscle spasm: Secondary | ICD-10-CM | POA: Diagnosis not present

## 2021-03-30 DIAGNOSIS — M545 Low back pain, unspecified: Secondary | ICD-10-CM | POA: Diagnosis not present

## 2021-03-30 DIAGNOSIS — Z79899 Other long term (current) drug therapy: Secondary | ICD-10-CM | POA: Diagnosis not present

## 2021-03-30 DIAGNOSIS — M25562 Pain in left knee: Secondary | ICD-10-CM | POA: Diagnosis not present

## 2021-03-30 DIAGNOSIS — G8929 Other chronic pain: Secondary | ICD-10-CM | POA: Diagnosis not present

## 2021-04-11 ENCOUNTER — Ambulatory Visit (INDEPENDENT_AMBULATORY_CARE_PROVIDER_SITE_OTHER): Payer: Medicare Other | Admitting: Physician Assistant

## 2021-04-11 ENCOUNTER — Encounter: Payer: Self-pay | Admitting: Physician Assistant

## 2021-04-11 ENCOUNTER — Other Ambulatory Visit: Payer: Self-pay

## 2021-04-11 DIAGNOSIS — Z20822 Contact with and (suspected) exposure to covid-19: Secondary | ICD-10-CM | POA: Diagnosis not present

## 2021-04-11 DIAGNOSIS — J449 Chronic obstructive pulmonary disease, unspecified: Secondary | ICD-10-CM

## 2021-04-11 DIAGNOSIS — F339 Major depressive disorder, recurrent, unspecified: Secondary | ICD-10-CM

## 2021-04-11 DIAGNOSIS — R058 Other specified cough: Secondary | ICD-10-CM | POA: Diagnosis not present

## 2021-04-11 DIAGNOSIS — F411 Generalized anxiety disorder: Secondary | ICD-10-CM

## 2021-04-11 MED ORDER — ALBUTEROL SULFATE HFA 108 (90 BASE) MCG/ACT IN AERS: 1.0000 | INHALATION_SPRAY | Freq: Four times a day (QID) | RESPIRATORY_TRACT | 1 refills | Status: DC | PRN

## 2021-04-11 MED ORDER — FLUOXETINE HCL 40 MG PO CAPS: ORAL_CAPSULE | ORAL | 2 refills | Status: DC

## 2021-04-11 MED ORDER — HYDROXYZINE PAMOATE 25 MG PO CAPS: 25.0000 mg | ORAL_CAPSULE | Freq: Three times a day (TID) | ORAL | 0 refills | Status: DC | PRN

## 2021-04-11 MED ORDER — COMBIVENT RESPIMAT 20-100 MCG/ACT IN AERS: 1.0000 | INHALATION_SPRAY | Freq: Four times a day (QID) | RESPIRATORY_TRACT | 1 refills | Status: DC | PRN

## 2021-04-11 NOTE — Progress Notes (Signed)
[facility] [place], [place] [address]  Internal MEDICINE  Telephone Visit  Patient Name: [name] [name]  [id number]  [id number]  Date of Service: 04/13/2021  I connected with the patient at 4:09 by telephone and verified the patients identity using two identifiers.   I discussed the limitations, risks, security and privacy concerns of performing an evaluation and management service by telephone and the availability of in person appointments. I also discussed with the patient that there may be a patient responsible charge related to the service.  The patient expressed understanding and agrees to proceed.    Chief Complaint  Patient presents with  . Follow-up    Anxiety and possible covid, [facility] will send covid test to lab but boyfriend is positive, pt feel Friday night, hurt legs, fell on knees, left arm hurts, and chest has hurt ever since  . Diabetes  . Hyperlipidemia  . Anxiety  . Asthma  . Telephone Screen    Phone call  . Telephone Assessment    [phone]  . Quality Metric Gaps    Colonoscopy     HPI Pt presents for follow up, however is actually sick with covid exposure -Boyfriend is positive for covid, has body aches, GI upset, CP--when she laughs or cough her chest hurts throughout ribs, and SOB with exertion. Going on since Saturday. Feels very fatigued and has headaches. Tried motrin not really helping, no congestion currently. Had covid test done today and will let us know results. -Needs inhaler refills  -Grandmother passed away and was supposed to go to funeral this weekend, and cant go now bc of covid exposure. -Vaccinated, but not boosted.  -Anxiety has been worse lately is unsure is prozac is effective enough on its own or if an alternative would be better  Current Medication: Outpatient Encounter Medications as of 04/11/2021  Medication Sig  . hydrOXYzine (VISTARIL) 25 MG capsule Take 1 capsule (25 mg total) by mouth every 8 (eight)  hours as needed.  Marland Kitchen ACCU-CHEK AVIVA PLUS test strip   . Accu-Chek Softclix Lancets lancets Use as instructed. DX E11.65  . albuterol (VENTOLIN HFA) 108 (90 Base) MCG/ACT inhaler Inhale 1-2 puffs into the lungs every 6 (six) hours as needed for wheezing or shortness of breath.  Marland Kitchen ammonium lactate (LAC-HYDRIN) 12 % lotion Apply 1 application topically as needed for dry skin.  . cetirizine (ZYRTEC) 10 MG tablet Take by mouth.  . Cholecalciferol 25 MCG (1000 UT) tablet Take 1 tablet (1,000 Units total) by mouth once a week.  . conjugated estrogens (PREMARIN) vaginal cream Place 1 Applicatorful vaginally daily. Twice a week  . cyclobenzaprine (FLEXERIL) 10 MG tablet Take 10 mg by mouth 3 (three) times daily as needed for muscle spasms.  . dapagliflozin propanediol (FARXIGA) 10 MG TABS tablet Take 1 tablet (10 mg total) by mouth daily.  . fenofibrate (TRICOR) 145 MG tablet Take by mouth.  Marland Kitchen FLUoxetine (PROZAC) 40 MG capsule TAKE 1 CAPSULE(40 MG) BY MOUTH DAILY  . fluticasone (FLONASE) 50 MCG/ACT nasal spray Place 2 sprays into both nostrils daily.  . furosemide (LASIX) 20 MG tablet Take 20 mg by mouth. Take 1/2 tab as needed  . HYDROmorphone (DILAUDID) 4 MG tablet Take by mouth every 6 (six) hours as needed for severe pain.  Marland Kitchen ibuprofen (ADVIL) 800 MG tablet Take 1 tablet (800 mg total) by mouth every 6 (six) hours as needed.  . insulin glargine, 2 Unit Dial, (TOUJEO MAX SOLOSTAR) 300 UNIT/ML Solostar  Pen Inject 35 Units into the skin daily.  . Insulin Pen Needle 31G X 5 MM MISC Use as directed  Once a day Dx e11.65  . Ipratropium-Albuterol (COMBIVENT RESPIMAT) 20-100 MCG/ACT AERS respimat Inhale 1 puff into the lungs every 6 (six) hours as needed for wheezing.  Marland Kitchen levothyroxine (SYNTHROID) 50 MCG tablet TAKE 1 TABLET BY MOUTH EVERY DAY  . Methylnaltrexone Bromide (RELISTOR) 150 MG TABS Take 3 tablets by mouth.  . morphine (MS CONTIN) 60 MG 12 hr tablet Take 60 mg by mouth every 12 (twelve) hours.  .  Multiple Vitamin (MULTI-VITAMIN) tablet Take 1 tablet by mouth daily.  . naloxegol oxalate (MOVANTIK) 12.5 MG TABS tablet TAKE 1 TABLET(S) BY MOUTH DAILY ON EMPTY STOMACH 1 HOUR BEFORE OR 2 HOURS AFTER FIRST MEAL.  . pantoprazole (PROTONIX) 40 MG tablet TAKE 1 TABLET(40 MG) BY MOUTH DAILY  . pravastatin (PRAVACHOL) 10 MG tablet Take 1 tablet (10 mg total) by mouth daily.  . pregabalin (LYRICA) 150 MG capsule Take 150 mg by mouth 3 (three) times daily.   . promethazine (PHENERGAN) 25 MG tablet Take 1 tablet (25 mg total) by mouth every 6 (six) hours as needed for nausea or vomiting.  . SUMAtriptan (IMITREX) 100 MG tablet Take 100 mg by mouth every 2 (two) hours as needed for migraine. May repeat in 2 hours if headache persists or recurs.  . triamcinolone ointment (KENALOG) 0.1 % Apply to affected area daily for two weeks  . VICTOZA 18 MG/3ML SOPN ADMINISTER 1.8 MG UNDER THE SKIN EVERY DAY  . Vitamin D, Ergocalciferol, (DRISDOL) 1.25 MG (50000 UNIT) CAPS capsule TAKE 1 CAPSULE (50,000 UNITS TOTAL) BY MOUTH EVERY 7 (SEVEN) DAYS.  . [DISCONTINUED] albuterol (VENTOLIN HFA) 108 (90 Base) MCG/ACT inhaler Inhale into the lungs.  . [DISCONTINUED] FLUoxetine (PROZAC) 40 MG capsule TAKE 1 CAPSULE(40 MG) BY MOUTH DAILY  . [DISCONTINUED] Ipratropium-Albuterol (COMBIVENT RESPIMAT) 20-100 MCG/ACT AERS respimat Inhale 1 puff into the lungs every 6 (six) hours.   No facility-administered encounter medications on file as of 04/11/2021.    Surgical History: Past Surgical History:  Procedure Laterality Date  . ABDOMINAL HYSTERECTOMY    . CARPAL TUNNEL RELEASE    . FOOT TENDON SURGERY Right   . KNEE SURGERY    . SPINAL FUSION    . SPINAL FUSION      Medical History: Past Medical History:  Diagnosis Date  . Actinic keratitis   . Allergy   . Anxiety   . Asthma   . Basal cell carcinoma   . Chronic venous insufficiency   . DDD (degenerative disc disease), lumbar   . Diabetes mellitus without complication  ([place])   . Fibromyalgia   . Fibromyalgia   . Hyperlipidemia   . Lymphedema   . Melanoma ([place])   . Migraine   . Mitral valve prolapse   . Tendonitis of foot    left    Family History: Family History  Problem Relation Age of Onset  . Cancer Paternal Aunt   . Cancer Paternal Uncle   . Diabetes Paternal Grandfather   . Heart disease Paternal Grandfather     Social History   Socioeconomic History  . Marital status: Divorced    Spouse name: Not on file  . Number of children: Not on file  . Years of education: Not on file  . Highest education level: Not on file  Occupational History  . Not on file  Tobacco Use  . Smoking status: Never Smoker  .  Smokeless tobacco: Never Used  Vaping Use  . Vaping Use: Never used  Substance and Sexual Activity  . Alcohol use: No  . Drug use: No  . Sexual activity: Not on file  Other Topics Concern  . Not on file  Social History Narrative  . Not on file   Social Determinants of Health   Financial Resource Strain: Not on file  Food Insecurity: Not on file  Transportation Needs: Not on file  Physical Activity: Not on file  Stress: Not on file  Social Connections: Not on file  Intimate Partner Violence: Not on file      Review of Systems  Constitutional: Positive for fatigue. Negative for chills and unexpected weight change.  HENT: Negative for congestion, postnasal drip, rhinorrhea, sneezing and sore throat.   Eyes: Negative for redness.  Respiratory: Positive for cough and shortness of breath. Negative for chest tightness and wheezing.   Cardiovascular: Positive for chest pain. Negative for palpitations.       Cp described as all over discomfort with coughing throughout ribs  Gastrointestinal: Positive for diarrhea. Negative for abdominal pain, constipation, nausea and vomiting.  Genitourinary: Negative for dysuria and frequency.  Musculoskeletal: Positive for myalgias. Negative for arthralgias, back pain, joint swelling and  neck pain.  Skin: Negative for rash.  Neurological: Positive for headaches. Negative for tremors and numbness.  Hematological: Negative for adenopathy. Does not bruise/bleed easily.  Psychiatric/Behavioral: Negative for behavioral problems (Depression), sleep disturbance and suicidal ideas. The patient is nervous/anxious.     Vital Signs: Resp 16   Ht 5\' 1"  (1.549 m)   Wt 161 lb (73 kg)   LMP 12/13/1991   BMI 30.42 kg/m    Observation/Objective:  Pt is able to carry out conversation   Assessment/Plan: 1. Cough with exposure to COVID-19 virus Covid test is pending. Educated to stay well hydrated and take tylenol as needed. Pt will call if worsening or go to ED.   2. Chronic obstructive pulmonary disease, unspecified COPD type ([place]) Refills sent for inhalers - Ipratropium-Albuterol (COMBIVENT RESPIMAT) 20-100 MCG/ACT AERS respimat; Inhale 1 puff into the lungs every 6 (six) hours as needed for wheezing.  Dispense: 4 g; Refill: 1 - albuterol (VENTOLIN HFA) 108 (90 Base) MCG/ACT inhaler; Inhale 1-2 puffs into the lungs every 6 (six) hours as needed for wheezing or shortness of breath.  Dispense: 18 g; Refill: 1  3. Depression, recurrent ([place]) Refill of prozac sent - FLUoxetine (PROZAC) 40 MG capsule; TAKE 1 CAPSULE(40 MG) BY MOUTH DAILY  Dispense: 90 capsule; Refill: 2  4. GAD (generalized anxiety disorder) Refill of prozac sent, though pt still feels anxious often, hydroxyzine added to be used as needed for anxiety--pt counseled that it can make her drowsy or cause dry mouth. - FLUoxetine (PROZAC) 40 MG capsule; TAKE 1 CAPSULE(40 MG) BY MOUTH DAILY  Dispense: 90 capsule; Refill: 2 - hydrOXYzine (VISTARIL) 25 MG capsule; Take 1 capsule (25 mg total) by mouth every 8 (eight) hours as needed.  Dispense: 30 capsule; Refill: 0    General Counseling: [name] [name] verbalizes understanding of the findings of today's phone visit and agrees with plan of treatment. I have discussed any further  diagnostic evaluation that may be needed or ordered today. We also reviewed her medications today. she has been encouraged to call the office with any questions or concerns that should arise related to todays visit.    No orders of the defined types were placed in this encounter.   Meds ordered this  encounter  Medications  . Ipratropium-Albuterol (COMBIVENT RESPIMAT) 20-100 MCG/ACT AERS respimat    Sig: Inhale 1 puff into the lungs every 6 (six) hours as needed for wheezing.    Dispense:  4 g    Refill:  1  . albuterol (VENTOLIN HFA) 108 (90 Base) MCG/ACT inhaler    Sig: Inhale 1-2 puffs into the lungs every 6 (six) hours as needed for wheezing or shortness of breath.    Dispense:  18 g    Refill:  1  . FLUoxetine (PROZAC) 40 MG capsule    Sig: TAKE 1 CAPSULE(40 MG) BY MOUTH DAILY    Dispense:  90 capsule    Refill:  2    ZERO refills remain on this prescription. Your patient is requesting advance approval of refills for this medication to [facility]  . hydrOXYzine (VISTARIL) 25 MG capsule    Sig: Take 1 capsule (25 mg total) by mouth every 8 (eight) hours as needed.    Dispense:  30 capsule    Refill:  0    Time spent:30 Minutes    Dr Lavera Guise Internal medicine

## 2021-04-13 ENCOUNTER — Other Ambulatory Visit: Payer: Self-pay | Admitting: Adult Health

## 2021-04-13 DIAGNOSIS — E782 Mixed hyperlipidemia: Secondary | ICD-10-CM

## 2021-04-15 ENCOUNTER — Ambulatory Visit: Payer: Medicare Other | Admitting: Physician Assistant

## 2021-04-18 DIAGNOSIS — Z20822 Contact with and (suspected) exposure to covid-19: Secondary | ICD-10-CM | POA: Diagnosis not present

## 2021-04-20 ENCOUNTER — Telehealth: Payer: Self-pay

## 2021-04-20 ENCOUNTER — Other Ambulatory Visit: Payer: Self-pay

## 2021-04-20 MED ORDER — LEVOTHYROXINE SODIUM 50 MCG PO TABS: 50.0000 ug | ORAL_TABLET | Freq: Every day | ORAL | 3 refills | Status: DC

## 2021-04-20 MED ORDER — AZITHROMYCIN 250 MG PO TABS: 250.0000 mg | ORAL_TABLET | Freq: Every day | ORAL | 0 refills | Status: DC

## 2021-04-20 NOTE — Telephone Encounter (Signed)
Pt called  That she tested positive for covid having body aches,coughing and fatigue and no appetite as per dr Humphrey Rolls advised  Pt send zithromax 250 mg 1 tab daily for 10 days and take mucinex for cough and used albuterol inhaler for shortness of breath if symptoms go worse need go to ED and also advised her to drink plenty of water and rest

## 2021-04-21 ENCOUNTER — Telehealth: Payer: Self-pay | Admitting: Internal Medicine

## 2021-04-21 NOTE — Progress Notes (Signed)
  Chronic Care Management   Note  04/21/2021 Name: TIMIYA HOWELLS MRN: [id number] DOB: 1976/04/14  MARCIE SHEARON is a 45 y.o. year old female who is a primary care patient of Lavera Guise, MD. I reached out to Dorcas Carrow by phone today in response to a referral sent by [name] Perazzo's PCP, Lavera Guise, MD.   Ms. Hagg was given information about Chronic Care Management services today including:  1. CCM service includes personalized support from designated clinical staff supervised by her physician, including individualized plan of care and coordination with other care providers 2. 24/7 contact phone numbers for assistance for urgent and routine care needs. 3. Service will only be billed when office clinical staff spend 20 minutes or more in a month to coordinate care. 4. Only one practitioner may furnish and bill the service in a calendar month. 5. The patient may stop CCM services at any time (effective at the end of the month) by phone call to the office staff.   Patient wishes to consider information provided and/or speak with a member of the care team before deciding about enrollment in care management services.   Follow up plan:   Carley Perdue [employer] Scheduler

## 2021-04-26 ENCOUNTER — Telehealth: Payer: Self-pay

## 2021-04-26 NOTE — Telephone Encounter (Signed)
Spoke to pt and informed her that I printed off a pt asst application for Toujeo and placed up front for her to pick up.  Pt will also get a sample of Toujeo when she picks up application

## 2021-04-26 NOTE — Telephone Encounter (Signed)
-----   Message from Mylinda Latina, PA-C sent at 04/13/2021  9:17 AM EDT ----- [name] [name] is too expensive for her, can we look into an alternative based on her insurance or try for assistance?

## 2021-04-27 DIAGNOSIS — R509 Fever, unspecified: Secondary | ICD-10-CM | POA: Diagnosis not present

## 2021-04-29 DIAGNOSIS — M545 Low back pain, unspecified: Secondary | ICD-10-CM | POA: Diagnosis not present

## 2021-04-29 DIAGNOSIS — M25562 Pain in left knee: Secondary | ICD-10-CM | POA: Diagnosis not present

## 2021-04-29 DIAGNOSIS — M62838 Other muscle spasm: Secondary | ICD-10-CM | POA: Diagnosis not present

## 2021-04-29 DIAGNOSIS — G8929 Other chronic pain: Secondary | ICD-10-CM | POA: Diagnosis not present

## 2021-04-29 DIAGNOSIS — Z79899 Other long term (current) drug therapy: Secondary | ICD-10-CM | POA: Diagnosis not present

## 2021-05-13 ENCOUNTER — Other Ambulatory Visit: Payer: Self-pay | Admitting: Nurse Practitioner

## 2021-05-13 DIAGNOSIS — E1165 Type 2 diabetes mellitus with hyperglycemia: Secondary | ICD-10-CM

## 2021-05-18 ENCOUNTER — Other Ambulatory Visit: Payer: Self-pay

## 2021-05-18 MED ORDER — DAPAGLIFLOZIN PROPANEDIOL 10 MG PO TABS: 10.0000 mg | ORAL_TABLET | Freq: Every day | ORAL | 1 refills | Status: DC

## 2021-05-19 ENCOUNTER — Telehealth: Payer: Self-pay

## 2021-05-19 NOTE — Telephone Encounter (Signed)
Lvm to r/s 10/07/21 appointment-[name] [name]

## 2021-05-27 DIAGNOSIS — M62838 Other muscle spasm: Secondary | ICD-10-CM | POA: Diagnosis not present

## 2021-05-27 DIAGNOSIS — G8929 Other chronic pain: Secondary | ICD-10-CM | POA: Diagnosis not present

## 2021-05-27 DIAGNOSIS — M545 Low back pain, unspecified: Secondary | ICD-10-CM | POA: Diagnosis not present

## 2021-05-27 DIAGNOSIS — Z79899 Other long term (current) drug therapy: Secondary | ICD-10-CM | POA: Diagnosis not present

## 2021-05-27 DIAGNOSIS — M25562 Pain in left knee: Secondary | ICD-10-CM | POA: Diagnosis not present

## 2021-06-27 DIAGNOSIS — Z79899 Other long term (current) drug therapy: Secondary | ICD-10-CM | POA: Diagnosis not present

## 2021-06-27 DIAGNOSIS — M25562 Pain in left knee: Secondary | ICD-10-CM | POA: Diagnosis not present

## 2021-06-27 DIAGNOSIS — G8929 Other chronic pain: Secondary | ICD-10-CM | POA: Diagnosis not present

## 2021-06-27 DIAGNOSIS — M545 Low back pain, unspecified: Secondary | ICD-10-CM | POA: Diagnosis not present

## 2021-06-27 DIAGNOSIS — M62838 Other muscle spasm: Secondary | ICD-10-CM | POA: Diagnosis not present

## 2021-06-29 ENCOUNTER — Telehealth: Payer: Self-pay

## 2021-06-29 ENCOUNTER — Other Ambulatory Visit: Payer: Self-pay

## 2021-06-29 MED ORDER — PANTOPRAZOLE SODIUM 40 MG PO TBEC: DELAYED_RELEASE_TABLET | ORAL | 0 refills | Status: DC

## 2021-06-29 NOTE — Telephone Encounter (Signed)
Spoke with patient, patient request refill on pantoprazole (PROTONIX) 40 MG tablet I advised patient she needed to make a follow up appointment before anymore refills are sent to pharmacy, patient did received a 30 day supply.LNB

## 2021-06-30 DIAGNOSIS — Z79899 Other long term (current) drug therapy: Secondary | ICD-10-CM | POA: Diagnosis not present

## 2021-07-25 ENCOUNTER — Other Ambulatory Visit: Payer: Self-pay

## 2021-07-25 ENCOUNTER — Other Ambulatory Visit: Payer: Self-pay | Admitting: Internal Medicine

## 2021-07-25 ENCOUNTER — Ambulatory Visit (INDEPENDENT_AMBULATORY_CARE_PROVIDER_SITE_OTHER): Payer: Medicare Other | Admitting: Physician Assistant

## 2021-07-25 ENCOUNTER — Encounter: Payer: Self-pay | Admitting: Physician Assistant

## 2021-07-25 VITALS — BP 130/86 | HR 84 | Temp 97.9°F | Resp 16 | Ht 61.0 in | Wt 170.6 lb

## 2021-07-25 DIAGNOSIS — E039 Hypothyroidism, unspecified: Secondary | ICD-10-CM

## 2021-07-25 DIAGNOSIS — H6002 Abscess of left external ear: Secondary | ICD-10-CM

## 2021-07-25 DIAGNOSIS — F339 Major depressive disorder, recurrent, unspecified: Secondary | ICD-10-CM

## 2021-07-25 DIAGNOSIS — F411 Generalized anxiety disorder: Secondary | ICD-10-CM | POA: Diagnosis not present

## 2021-07-25 DIAGNOSIS — R5383 Other fatigue: Secondary | ICD-10-CM | POA: Diagnosis not present

## 2021-07-25 MED ORDER — PANTOPRAZOLE SODIUM 40 MG PO TBEC: DELAYED_RELEASE_TABLET | ORAL | 1 refills | Status: DC

## 2021-07-25 MED ORDER — FLUOXETINE HCL 40 MG PO CAPS
ORAL_CAPSULE | ORAL | 2 refills | Status: DC
Start: 2021-07-25 — End: 2021-09-26

## 2021-07-25 MED ORDER — SULFAMETHOXAZOLE-TRIMETHOPRIM 800-160 MG PO TABS: 1.0000 | ORAL_TABLET | Freq: Two times a day (BID) | ORAL | 0 refills | Status: DC

## 2021-07-25 NOTE — Progress Notes (Signed)
[facility] [place], [place] [address]  Internal MEDICINE  Office Visit Note  Patient Name: [name] [name]  [id number]  [id number]  Date of Service: 07/31/2021  Chief Complaint  Patient presents with   boil behind left ear    Started 2 weeks ago, ear throbing with pain, tried to bust the boil, has been draining     HPI Pt is here for a sick visit. -Boil draining the past 2 weeks behind left ear. Starts to go down in size but then seems to get bigger again and is painful down along ear toward jaw. Draining periodically -Denies any fevers or chills -Bactrim is ok per patient despite chart showing sulfa allergy. Pt educated to stop and call office/go to ED if any adverse reaction.  Current Medication:  Outpatient Encounter Medications as of 07/25/2021  Medication Sig   sulfamethoxazole-trimethoprim (BACTRIM DS) 800-160 MG tablet Take 1 tablet by mouth 2 (two) times daily.   ACCU-CHEK AVIVA PLUS test strip    Accu-Chek Softclix Lancets lancets Use as instructed. DX E11.65   albuterol (VENTOLIN HFA) 108 (90 Base) MCG/ACT inhaler Inhale 1-2 puffs into the lungs every 6 (six) hours as needed for wheezing or shortness of breath.   ammonium lactate (LAC-HYDRIN) 12 % lotion Apply 1 application topically as needed for dry skin.   cetirizine (ZYRTEC) 10 MG tablet Take by mouth.   Cholecalciferol 25 MCG (1000 UT) tablet Take 1 tablet (1,000 Units total) by mouth once a week.   conjugated estrogens (PREMARIN) vaginal cream Place 1 Applicatorful vaginally daily. Twice a week   cyclobenzaprine (FLEXERIL) 10 MG tablet Take 10 mg by mouth 3 (three) times daily as needed for muscle spasms.   dapagliflozin propanediol (FARXIGA) 10 MG TABS tablet Take 1 tablet (10 mg total) by mouth daily.   fenofibrate (TRICOR) 145 MG tablet Take by mouth.   FLUoxetine (PROZAC) 40 MG capsule TAKE 1 CAPSULE(40 MG) BY MOUTH DAILY   fluticasone (FLONASE) 50 MCG/ACT nasal spray Place 2  sprays into both nostrils daily.   furosemide (LASIX) 20 MG tablet Take 20 mg by mouth. Take 1/2 tab as needed   HYDROmorphone (DILAUDID) 4 MG tablet Take by mouth every 6 (six) hours as needed for severe pain.   hydrOXYzine (VISTARIL) 25 MG capsule Take 1 capsule (25 mg total) by mouth every 8 (eight) hours as needed.   ibuprofen (ADVIL) 800 MG tablet Take 1 tablet (800 mg total) by mouth every 6 (six) hours as needed.   insulin glargine, 2 Unit Dial, (TOUJEO MAX SOLOSTAR) 300 UNIT/ML Solostar Pen Inject 35 Units into the skin daily.   Insulin Pen Needle 31G X 5 MM MISC Use as directed  Once a day Dx e11.65   Ipratropium-Albuterol (COMBIVENT RESPIMAT) 20-100 MCG/ACT AERS respimat Inhale 1 puff into the lungs every 6 (six) hours as needed for wheezing.   levothyroxine (SYNTHROID) 50 MCG tablet Take 1 tablet (50 mcg total) by mouth daily.   Methylnaltrexone Bromide (RELISTOR) 150 MG TABS Take 3 tablets by mouth.   morphine (MS CONTIN) 60 MG 12 hr tablet Take 60 mg by mouth every 12 (twelve) hours.   Multiple Vitamin (MULTI-VITAMIN) tablet Take 1 tablet by mouth daily.   naloxegol oxalate (MOVANTIK) 12.5 MG TABS tablet TAKE 1 TABLET(S) BY MOUTH DAILY ON EMPTY STOMACH 1 HOUR BEFORE OR 2 HOURS AFTER FIRST MEAL.   pantoprazole (PROTONIX) 40 MG tablet Take 1 tablet by mouth daily   pravastatin (PRAVACHOL) 10 MG  tablet Take 1 tablet (10 mg total) by mouth daily.   pregabalin (LYRICA) 150 MG capsule Take 150 mg by mouth 3 (three) times daily.    promethazine (PHENERGAN) 25 MG tablet Take 1 tablet (25 mg total) by mouth every 6 (six) hours as needed for nausea or vomiting.   SUMAtriptan (IMITREX) 100 MG tablet Take 100 mg by mouth every 2 (two) hours as needed for migraine. May repeat in 2 hours if headache persists or recurs.   triamcinolone ointment (KENALOG) 0.1 % Apply to affected area daily for two weeks   VICTOZA 18 MG/3ML SOPN ADMINISTER 1.8 MG UNDER THE SKIN EVERY DAY   Vitamin D, Ergocalciferol,  (DRISDOL) 1.25 MG (50000 UNIT) CAPS capsule TAKE 1 CAPSULE (50,000 UNITS TOTAL) BY MOUTH EVERY 7 (SEVEN) DAYS.   [DISCONTINUED] azithromycin (ZITHROMAX) 250 MG tablet Take 1 tablet (250 mg total) by mouth daily. For 10 days for sinus infection (Patient not taking: Reported on 07/25/2021)   [DISCONTINUED] FLUoxetine (PROZAC) 40 MG capsule TAKE 1 CAPSULE(40 MG) BY MOUTH DAILY   [DISCONTINUED] pantoprazole (PROTONIX) 40 MG tablet Take 1 tablet by mouth daily   No facility-administered encounter medications on file as of 07/25/2021.      Medical History: Past Medical History:  Diagnosis Date   Actinic keratitis    Allergy    Anxiety    Asthma    Basal cell carcinoma    Chronic venous insufficiency    DDD (degenerative disc disease), lumbar    Diabetes mellitus without complication (HCC)    Fibromyalgia    Fibromyalgia    Hyperlipidemia    Lymphedema    Melanoma ([place])    Migraine    Mitral valve prolapse    Tendonitis of foot    left     Vital Signs: BP 130/86   Pulse 84   Temp 97.9 F (36.6 C)   Resp 16   Ht '5\' 1"'$  (1.549 m)   Wt 170 lb 9.6 oz (77.4 kg)   LMP 12/13/1991   SpO2 97%   BMI 32.23 kg/m    Review of Systems  Constitutional:  Negative for fatigue and fever.  HENT:  Positive for ear pain. Negative for congestion, mouth sores and postnasal drip.   Respiratory:  Negative for cough.   Cardiovascular:  Negative for chest pain.  Genitourinary:  Negative for flank pain.  Skin:  Positive for wound.       Painful boil behind/below left ear  Neurological:  Negative for headaches.  Psychiatric/Behavioral: Negative.     Physical Exam Vitals and nursing note reviewed.  Constitutional:      General: She is not in acute distress.    Appearance: She is well-developed. She is obese. She is not diaphoretic.  HENT:     Head: Normocephalic and atraumatic.     Mouth/Throat:     Pharynx: No oropharyngeal exudate.  Eyes:     Pupils: Pupils are equal, round, and  reactive to light.  Neck:     Thyroid: No thyromegaly.     Vascular: No JVD.     Trachea: No tracheal deviation.  Cardiovascular:     Rate and Rhythm: Normal rate and regular rhythm.     Heart sounds: Normal heart sounds. No murmur heard.   No friction rub. No gallop.  Pulmonary:     Effort: Pulmonary effort is normal. No respiratory distress.     Breath sounds: No wheezing or rales.  Chest:     Chest wall: No  tenderness.  Abdominal:     General: Bowel sounds are normal.     Palpations: Abdomen is soft.  Musculoskeletal:        General: Normal range of motion.     Cervical back: Normal range of motion and neck supple.  Lymphadenopathy:     Cervical: No cervical adenopathy.  Skin:    General: Skin is warm and dry.     Findings: Erythema present.     Comments: Small red bump below/posterior to left ear lobe, not actively draining  Neurological:     Mental Status: She is alert and oriented to person, place, and time.     Cranial Nerves: No cranial nerve deficit.  Psychiatric:        Behavior: Behavior normal.        Thought Content: Thought content normal.        Judgment: Judgment normal.      Assessment/Plan: 1. Boil, ear, left Will treatment with bactrim BID. Patient educated to call or go to ED if not improving or any worsening. Keep area clean and use warm compress to continue draining - sulfamethoxazole-trimethoprim (BACTRIM DS) 800-160 MG tablet; Take 1 tablet by mouth 2 (two) times daily.  Dispense: 28 tablet; Refill: 0  2. Depression, recurrent (HCC) Stable, requesting refills - FLUoxetine (PROZAC) 40 MG capsule; TAKE 1 CAPSULE(40 MG) BY MOUTH DAILY  Dispense: 90 capsule; Refill: 2  3. GAD (generalized anxiety disorder) - FLUoxetine (PROZAC) 40 MG capsule; TAKE 1 CAPSULE(40 MG) BY MOUTH DAILY  Dispense: 90 capsule; Refill: 2  4. Hypothyroidism, unspecified type Will update labs - TSH + free T4  5. Other fatigue - CBC w/Diff/Platelet - Comprehensive  metabolic panel - Lipid Panel With LDL/HDL Ratio   General Counseling: Jossalyn verbalizes understanding of the findings of todays visit and agrees with plan of treatment. I have discussed any further diagnostic evaluation that may be needed or ordered today. We also reviewed her medications today. she has been encouraged to call the office with any questions or concerns that should arise related to todays visit.    Counseling:    Orders Placed This Encounter  Procedures   CBC w/Diff/Platelet   Comprehensive metabolic panel   Lipid Panel With LDL/HDL Ratio   TSH + free T4    Meds ordered this encounter  Medications   sulfamethoxazole-trimethoprim (BACTRIM DS) 800-160 MG tablet    Sig: Take 1 tablet by mouth 2 (two) times daily.    Dispense:  28 tablet    Refill:  0   pantoprazole (PROTONIX) 40 MG tablet    Sig: Take 1 tablet by mouth daily    Dispense:  90 tablet    Refill:  1    ZERO refills remain on this prescription. Your patient is requesting advance approval of refills for this medication to PREVENT ANY MISSED DOSES   FLUoxetine (PROZAC) 40 MG capsule    Sig: TAKE 1 CAPSULE(40 MG) BY MOUTH DAILY    Dispense:  90 capsule    Refill:  2    ZERO refills remain on this prescription. Your patient is requesting advance approval of refills for this medication to [place]    Time spent:30 Minutes

## 2021-08-01 DIAGNOSIS — M545 Low back pain, unspecified: Secondary | ICD-10-CM | POA: Diagnosis not present

## 2021-08-01 DIAGNOSIS — G8929 Other chronic pain: Secondary | ICD-10-CM | POA: Diagnosis not present

## 2021-08-01 DIAGNOSIS — M25562 Pain in left knee: Secondary | ICD-10-CM | POA: Diagnosis not present

## 2021-08-01 DIAGNOSIS — Z79899 Other long term (current) drug therapy: Secondary | ICD-10-CM | POA: Diagnosis not present

## 2021-08-01 DIAGNOSIS — M62838 Other muscle spasm: Secondary | ICD-10-CM | POA: Diagnosis not present

## 2021-08-13 ENCOUNTER — Other Ambulatory Visit: Payer: Self-pay

## 2021-08-13 MED ORDER — PREDNISONE 10 MG PO TABS: ORAL_TABLET | ORAL | 0 refills | Status: DC

## 2021-08-13 MED ORDER — AZITHROMYCIN 250 MG PO TABS: ORAL_TABLET | ORAL | 0 refills | Status: DC

## 2021-08-13 NOTE — Telephone Encounter (Signed)
Pt called and stated she tested positive for covid again and symptoms started on Thursday, body aches, fatigue, diarrhea, no fever, no appetite.  Per Lauren I sent zpak for covid 10 tabs and prednisone for covid 18 tabs to pharmacy and notified patient

## 2021-08-25 ENCOUNTER — Ambulatory Visit: Payer: Medicare Other | Admitting: Physician Assistant

## 2021-08-29 DIAGNOSIS — Z79899 Other long term (current) drug therapy: Secondary | ICD-10-CM | POA: Diagnosis not present

## 2021-08-29 DIAGNOSIS — G8929 Other chronic pain: Secondary | ICD-10-CM | POA: Diagnosis not present

## 2021-08-29 DIAGNOSIS — M545 Low back pain, unspecified: Secondary | ICD-10-CM | POA: Diagnosis not present

## 2021-08-29 DIAGNOSIS — M62838 Other muscle spasm: Secondary | ICD-10-CM | POA: Diagnosis not present

## 2021-08-29 DIAGNOSIS — M25562 Pain in left knee: Secondary | ICD-10-CM | POA: Diagnosis not present

## 2021-09-01 ENCOUNTER — Ambulatory Visit: Payer: Medicare Other | Admitting: Physician Assistant

## 2021-09-19 ENCOUNTER — Telehealth: Payer: Self-pay | Admitting: Internal Medicine

## 2021-09-19 NOTE — Chronic Care Management (AMB) (Signed)
  Chronic Care Management   Note  09/19/2021 Name: [name] [name] MRN: [id number] DOB: 22-Dec-1975  [name] [name] is a 45 y.o. year old female who is a primary care patient of Lavera Guise, MD. I reached out to Dorcas Carrow by phone today in response to a referral sent by [name] David's PCP, Lavera Guise, MD.   [name] [name] was given information about Chronic Care Management services today including:  CCM service includes personalized support from designated clinical staff supervised by her physician, including individualized plan of care and coordination with other care providers 24/7 contact phone numbers for assistance for urgent and routine care needs. Service will only be billed when office clinical staff spend 20 minutes or more in a month to coordinate care. Only one practitioner may furnish and bill the service in a calendar month. The patient may stop CCM services at any time (effective at the end of the month) by phone call to the office staff.   Patient agreed to services and verbal consent obtained.   Follow up plan:   Tatjana Secretary/administrator

## 2021-09-20 DIAGNOSIS — G4733 Obstructive sleep apnea (adult) (pediatric): Secondary | ICD-10-CM | POA: Diagnosis not present

## 2021-09-26 ENCOUNTER — Other Ambulatory Visit: Payer: Self-pay

## 2021-09-26 ENCOUNTER — Encounter: Payer: Self-pay | Admitting: Nurse Practitioner

## 2021-09-26 ENCOUNTER — Ambulatory Visit (INDEPENDENT_AMBULATORY_CARE_PROVIDER_SITE_OTHER): Payer: Medicare Other | Admitting: Nurse Practitioner

## 2021-09-26 VITALS — BP 130/90 | HR 95 | Temp 98.7°F | Resp 16 | Ht 61.0 in | Wt 170.4 lb

## 2021-09-26 DIAGNOSIS — F411 Generalized anxiety disorder: Secondary | ICD-10-CM

## 2021-09-26 DIAGNOSIS — E559 Vitamin D deficiency, unspecified: Secondary | ICD-10-CM

## 2021-09-26 DIAGNOSIS — F5102 Adjustment insomnia: Secondary | ICD-10-CM | POA: Diagnosis not present

## 2021-09-26 DIAGNOSIS — F4321 Adjustment disorder with depressed mood: Secondary | ICD-10-CM | POA: Diagnosis not present

## 2021-09-26 DIAGNOSIS — F329 Major depressive disorder, single episode, unspecified: Secondary | ICD-10-CM

## 2021-09-26 DIAGNOSIS — J3089 Other allergic rhinitis: Secondary | ICD-10-CM | POA: Diagnosis not present

## 2021-09-26 DIAGNOSIS — F331 Major depressive disorder, recurrent, moderate: Secondary | ICD-10-CM | POA: Diagnosis not present

## 2021-09-26 MED ORDER — VITAMIN D (ERGOCALCIFEROL) 1.25 MG (50000 UNIT) PO CAPS: 50000.0000 [IU] | ORAL_CAPSULE | ORAL | 1 refills | Status: DC

## 2021-09-26 MED ORDER — CETIRIZINE HCL 10 MG PO TABS: 10.0000 mg | ORAL_TABLET | Freq: Every day | ORAL | 1 refills | Status: DC

## 2021-09-26 MED ORDER — AMITRIPTYLINE HCL 50 MG PO TABS: 50.0000 mg | ORAL_TABLET | Freq: Every day | ORAL | 0 refills | Status: DC
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
## 2021-09-28 DIAGNOSIS — Z79899 Other long term (current) drug therapy: Secondary | ICD-10-CM | POA: Diagnosis not present

## 2021-09-28 DIAGNOSIS — M545 Low back pain, unspecified: Secondary | ICD-10-CM | POA: Diagnosis not present

## 2021-09-28 DIAGNOSIS — M25562 Pain in left knee: Secondary | ICD-10-CM | POA: Diagnosis not present

## 2021-09-28 DIAGNOSIS — G8929 Other chronic pain: Secondary | ICD-10-CM | POA: Diagnosis not present

## 2021-09-28 DIAGNOSIS — M62838 Other muscle spasm: Secondary | ICD-10-CM | POA: Diagnosis not present

## 2021-10-04 ENCOUNTER — Other Ambulatory Visit: Payer: Self-pay | Admitting: Internal Medicine

## 2021-10-05 DIAGNOSIS — M1712 Unilateral primary osteoarthritis, left knee: Secondary | ICD-10-CM | POA: Diagnosis not present

## 2021-10-07 ENCOUNTER — Telehealth: Payer: Self-pay

## 2021-10-07 ENCOUNTER — Other Ambulatory Visit: Payer: Self-pay | Admitting: Nurse Practitioner

## 2021-10-07 ENCOUNTER — Ambulatory Visit: Payer: Medicare Other | Admitting: Physician Assistant

## 2021-10-07 DIAGNOSIS — F331 Major depressive disorder, recurrent, moderate: Secondary | ICD-10-CM

## 2021-10-07 DIAGNOSIS — F5102 Adjustment insomnia: Secondary | ICD-10-CM

## 2021-10-07 DIAGNOSIS — F4321 Adjustment disorder with depressed mood: Secondary | ICD-10-CM

## 2021-10-07 NOTE — Telephone Encounter (Signed)
[name] [name] will send med to pharmacy, pt is aware

## 2021-10-07 NOTE — Telephone Encounter (Signed)
Alyssa will send in meds

## 2021-10-08 MED ORDER — BUPROPION HCL ER (XL) 150 MG PO TB24: 150.0000 mg | ORAL_TABLET | Freq: Every day | ORAL | 2 refills | Status: DC

## 2021-10-13 ENCOUNTER — Telehealth (INDEPENDENT_AMBULATORY_CARE_PROVIDER_SITE_OTHER): Payer: Medicare Other | Admitting: Nurse Practitioner

## 2021-10-13 ENCOUNTER — Other Ambulatory Visit: Payer: Self-pay

## 2021-10-13 VITALS — Temp 100.1°F | Ht 61.0 in | Wt 170.0 lb

## 2021-10-13 DIAGNOSIS — R051 Acute cough: Secondary | ICD-10-CM

## 2021-10-13 DIAGNOSIS — Z20822 Contact with and (suspected) exposure to covid-19: Secondary | ICD-10-CM

## 2021-10-13 DIAGNOSIS — R059 Cough, unspecified: Secondary | ICD-10-CM

## 2021-10-13 DIAGNOSIS — J440 Chronic obstructive pulmonary disease with acute lower respiratory infection: Secondary | ICD-10-CM | POA: Diagnosis not present

## 2021-10-13 DIAGNOSIS — J0100 Acute maxillary sinusitis, unspecified: Secondary | ICD-10-CM | POA: Diagnosis not present

## 2021-10-13 DIAGNOSIS — J209 Acute bronchitis, unspecified: Secondary | ICD-10-CM | POA: Diagnosis not present

## 2021-10-13 LAB — POCT INFLUENZA A/B
Influenza A, POC: NEGATIVE
Influenza B, POC: NEGATIVE

## 2021-10-13 MED ORDER — AMOXICILLIN-POT CLAVULANATE 875-125 MG PO TABS: 1.0000 | ORAL_TABLET | Freq: Two times a day (BID) | ORAL | 0 refills | Status: AC

## 2021-10-13 MED ORDER — PREDNISONE 10 MG PO TABS: ORAL_TABLET | ORAL | 0 refills | Status: DC

## 2021-10-13 MED ORDER — IPRATROPIUM-ALBUTEROL 0.5-2.5 (3) MG/3ML IN SOLN: 3.0000 mL | RESPIRATORY_TRACT | 2 refills | Status: DC | PRN

## 2021-10-13 MED ORDER — BENZONATATE 100 MG PO CAPS: 100.0000 mg | ORAL_CAPSULE | Freq: Two times a day (BID) | ORAL | 0 refills | Status: DC | PRN

## 2021-10-13 NOTE — Progress Notes (Signed)
[facility] [place], [place] [address]  Internal MEDICINE  Telephone Visit  Patient Name: [name] [name]  [id number]  [id number]  Date of Service: 10/13/2021  I connected with the patient at 11:00 AM by telephone and verified the patients identity using two identifiers.   I discussed the limitations, risks, security and privacy concerns of performing an evaluation and management service by telephone and the availability of in person appointments. I also discussed with the patient that there may be a patient responsible charge related to the service.  The patient expressed understanding and agrees to proceed.    Chief Complaint  Patient presents with   Acute Visit   Telephone Assessment    [phone] [facility] @10 :36   Telephone Screen    Covid test negative   Cough   Fever   Nausea    HPI [name] presents for a telehealth virtual visit for acute symptoms of cough, fever and nausea. Her covid test was negative. She has been taking mucinex. She has had chills and fatigue.    Current Medication: Outpatient Encounter Medications as of 10/13/2021  Medication Sig   ACCU-CHEK AVIVA PLUS test strip    Accu-Chek Softclix Lancets lancets Use as instructed. DX E11.65   albuterol (VENTOLIN HFA) 108 (90 Base) MCG/ACT inhaler Inhale 1-2 puffs into the lungs every 6 (six) hours as needed for wheezing or shortness of breath.   ammonium lactate (LAC-HYDRIN) 12 % lotion Apply 1 application topically as needed for dry skin.   [EXPIRED] amoxicillin-clavulanate (AUGMENTIN) 875-125 MG tablet Take 1 tablet by mouth 2 (two) times daily for 10 days.   benzonatate (TESSALON) 100 MG capsule Take 1 capsule (100 mg total) by mouth 2 (two) times daily as needed for cough.   cetirizine (ZYRTEC) 10 MG tablet Take 1 tablet (10 mg total) by mouth daily.   Cholecalciferol 25 MCG (1000 UT) tablet Take 1 tablet (1,000 Units total) by mouth once a week.   diclofenac Sodium (VOLTAREN) 1 %  GEL SMARTSIG:2-4 Gram(s) Topical 1-4 Times Daily PRN   fenofibrate (TRICOR) 145 MG tablet Take by mouth.   fluticasone (FLONASE) 50 MCG/ACT nasal spray Place 2 sprays into both nostrils daily.   furosemide (LASIX) 20 MG tablet Take 20 mg by mouth. Take 1/2 tab as needed   HYDROmorphone (DILAUDID) 4 MG tablet Take by mouth every 6 (six) hours as needed for severe pain.   hydrOXYzine (VISTARIL) 25 MG capsule Take 1 capsule (25 mg total) by mouth every 8 (eight) hours as needed.   ibuprofen (ADVIL) 800 MG tablet Take 1 tablet (800 mg total) by mouth every 6 (six) hours as needed.   insulin glargine, 2 Unit Dial, (TOUJEO MAX SOLOSTAR) 300 UNIT/ML Solostar Pen Inject 35 Units into the skin daily.   Ipratropium-Albuterol (COMBIVENT RESPIMAT) 20-100 MCG/ACT AERS respimat Inhale 1 puff into the lungs every 6 (six) hours as needed for wheezing.   ipratropium-albuterol (DUONEB) 0.5-2.5 (3) MG/3ML SOLN Take 3 mLs by nebulization every 4 (four) hours as needed (wheezing,SOB and cough).   levothyroxine (SYNTHROID) 50 MCG tablet TAKE 1 TABLET BY MOUTH EVERY DAY   methocarbamol (ROBAXIN) 500 MG tablet PLEASE SEE ATTACHED FOR DETAILED DIRECTIONS   morphine (MS CONTIN) 60 MG 12 hr tablet Take 60 mg by mouth every 12 (twelve) hours.   Multiple Vitamin (MULTI-VITAMIN) tablet Take 1 tablet by mouth daily.   pantoprazole (PROTONIX) 40 MG tablet Take 1 tablet by mouth daily   pravastatin (PRAVACHOL) 10 MG tablet  Take 1 tablet (10 mg total) by mouth daily.   pregabalin (LYRICA) 150 MG capsule Take 150 mg by mouth 3 (three) times daily.    promethazine (PHENERGAN) 25 MG tablet Take 1 tablet (25 mg total) by mouth every 6 (six) hours as needed for nausea or vomiting.   SUMAtriptan (IMITREX) 100 MG tablet Take 100 mg by mouth every 2 (two) hours as needed for migraine. May repeat in 2 hours if headache persists or recurs.   triamcinolone ointment (KENALOG) 0.1 % Apply to affected area daily for two weeks   Vitamin D,  Ergocalciferol, (DRISDOL) 1.25 MG (50000 UNIT) CAPS capsule Take 1 capsule (50,000 Units total) by mouth every 7 (seven) days.   [DISCONTINUED] buPROPion (WELLBUTRIN XL) 150 MG 24 hr tablet Take 1 tablet (150 mg total) by mouth daily. (Patient not taking: Reported on 10/24/2021)   [DISCONTINUED] dapagliflozin propanediol (FARXIGA) 10 MG TABS tablet Take 1 tablet (10 mg total) by mouth daily.   [DISCONTINUED] Insulin Pen Needle 31G X 5 MM MISC Use as directed  Once a day Dx e11.65   [DISCONTINUED] predniSONE (DELTASONE) 10 MG tablet Take one tab 3 x day for 3 days, then take one tab 2 x a day for 3 days and then take one tab a day for 3 days for copd (Patient not taking: Reported on 10/24/2021)   [DISCONTINUED] VICTOZA 18 MG/3ML SOPN ADMINISTER 1.8 MG UNDER THE SKIN EVERY DAY   No facility-administered encounter medications on file as of 10/13/2021.    Surgical History: Past Surgical History:  Procedure Laterality Date   ABDOMINAL HYSTERECTOMY     CARPAL TUNNEL RELEASE     FOOT TENDON SURGERY Right    KNEE SURGERY     SPINAL FUSION     SPINAL FUSION      Medical History: Past Medical History:  Diagnosis Date   Actinic keratitis    Allergy    Anxiety    Asthma    Basal cell carcinoma    Chronic venous insufficiency    DDD (degenerative disc disease), lumbar    Diabetes mellitus without complication (HCC)    Fibromyalgia    Fibromyalgia    Hyperlipidemia    Lymphedema    Melanoma ([place])    Migraine    Mitral valve prolapse    Tendonitis of foot    left    Family History: Family History  Problem Relation Age of Onset   Cancer Paternal Aunt    Cancer Paternal Uncle    Diabetes Paternal Grandfather    Heart disease Paternal Grandfather     Social History   Socioeconomic History   Marital status: Divorced    Spouse name: Not on file   Number of children: Not on file   Years of education: Not on file   Highest education level: Not on file  Occupational History   Not  on file  Tobacco Use   Smoking status: Never   Smokeless tobacco: Never  Vaping Use   Vaping Use: Never used  Substance and Sexual Activity   Alcohol use: No   Drug use: No   Sexual activity: Not on file  Other Topics Concern   Not on file  Social History Narrative   Not on file   Social Determinants of Health   Financial Resource Strain: Not on file  Food Insecurity: Not on file  Transportation Needs: Not on file  Physical Activity: Not on file  Stress: Not on file  Social Connections: Not  on file  Intimate Partner Violence: Not on file      Review of Systems  Constitutional:  Positive for fatigue and fever. Negative for chills.  HENT:  Positive for congestion. Negative for ear pain, postnasal drip, rhinorrhea, sinus pressure, sinus pain, sneezing and sore throat.   Eyes:  Negative for pain.  Respiratory:  Positive for cough, chest tightness, shortness of breath and wheezing.   Cardiovascular: Negative.  Negative for chest pain and palpitations.  Gastrointestinal:  Positive for nausea. Negative for abdominal pain, constipation, diarrhea and vomiting.  Genitourinary: Negative.   Musculoskeletal: Negative.  Negative for myalgias.  Skin: Negative.  Negative for rash.   Vital Signs: Temp 100.1 F (37.8 C)   Ht 5\' 1"  (1.549 m)   Wt 170 lb (77.1 kg)   LMP 12/13/1991   BMI 32.12 kg/m    Observation/Objective: She is alert and oriented and engages in conversation appropriately. She does not appear to be in any acute distress over video call.     Assessment/Plan: 1. Acute non-recurrent maxillary sinusitis Empiric antibiotic treatment prescribed.  - amoxicillin-clavulanate (AUGMENTIN) 875-125 MG tablet; Take 1 tablet by mouth 2 (two) times daily for 10 days.  Dispense: 20 tablet; Refill: 0  2. Acute bronchitis with COPD ([place]) Empiric antibiotic treatment prescribed.  - ipratropium-albuterol (DUONEB) 0.5-2.5 (3) MG/3ML SOLN; Take 3 mLs by nebulization every 4 (four)  hours as needed (wheezing,SOB and cough).  Dispense: 360 mL; Refill: 2 - amoxicillin-clavulanate (AUGMENTIN) 875-125 MG tablet; Take 1 tablet by mouth 2 (two) times daily for 10 days.  Dispense: 20 tablet; Refill: 0  3. Acute cough Negative for flu, tested in parking lot of office. Symptomatic treatment prescribed.  - benzonatate (TESSALON) 100 MG capsule; Take 1 capsule (100 mg total) by mouth 2 (two) times daily as needed for cough.  Dispense: 30 capsule; Refill: 0 - POCT Influenza A/B   General Counseling: Fynley verbalizes understanding of the findings of today's phone visit and agrees with plan of treatment. I have discussed any further diagnostic evaluation that may be needed or ordered today. We also reviewed her medications today. she has been encouraged to call the office with any questions or concerns that should arise related to todays visit.  Return if symptoms worsen or fail to improve.   Orders Placed This Encounter  Procedures   POCT Influenza A/B     Meds ordered this encounter  Medications   ipratropium-albuterol (DUONEB) 0.5-2.5 (3) MG/3ML SOLN    Sig: Take 3 mLs by nebulization every 4 (four) hours as needed (wheezing,SOB and cough).    Dispense:  360 mL    Refill:  2   DISCONTD: predniSONE (DELTASONE) 10 MG tablet    Sig: Take one tab 3 x day for 3 days, then take one tab 2 x a day for 3 days and then take one tab a day for 3 days for copd    Dispense:  18 tablet    Refill:  0   benzonatate (TESSALON) 100 MG capsule    Sig: Take 1 capsule (100 mg total) by mouth 2 (two) times daily as needed for cough.    Dispense:  30 capsule    Refill:  0   amoxicillin-clavulanate (AUGMENTIN) 875-125 MG tablet    Sig: Take 1 tablet by mouth 2 (two) times daily for 10 days.    Dispense:  20 tablet    Refill:  0   Return if symptoms worsen or fail to improve.  Time spent:10  Minutes Time spent with patient included reviewing progress notes, labs, imaging studies, and  discussing plan for follow up.  [place] Controlled Substance Database was reviewed by me for overdose risk score (ORS) if appropriate.  This patient was seen by Jonetta Osgood, FNP-C in collaboration with Dr. Clayborn Bigness as a part of collaborative care agreement.  Luane Rochon R. Valetta Fuller, MSN, FNP-C Internal medicine

## 2021-10-18 ENCOUNTER — Telehealth: Payer: Self-pay

## 2021-10-18 ENCOUNTER — Other Ambulatory Visit: Payer: Self-pay | Admitting: Internal Medicine

## 2021-10-18 MED ORDER — ALPRAZOLAM 0.25 MG PO TABS: ORAL_TABLET | ORAL | 0 refills | Status: DC

## 2021-10-18 NOTE — Telephone Encounter (Signed)
Please advise pt she needs to continue to take her medicine at home because it was just started, however I am sending some xanax to be taken as needed

## 2021-10-18 NOTE — Telephone Encounter (Signed)
Patient called very upset stating current medication is not helping with anxiety. She needs something different to help her get through family member memorial-Toni

## 2021-10-18 NOTE — Telephone Encounter (Signed)
Notified patient to continue taking her medicine at home because it was just started, however dfk is sending in some xanax to take as needed-Toni

## 2021-10-20 ENCOUNTER — Telehealth: Payer: Self-pay

## 2021-10-20 ENCOUNTER — Other Ambulatory Visit: Payer: Self-pay

## 2021-10-20 NOTE — Telephone Encounter (Signed)
Pt advised sample for toujeo ready for pickup

## 2021-10-24 ENCOUNTER — Ambulatory Visit (INDEPENDENT_AMBULATORY_CARE_PROVIDER_SITE_OTHER): Payer: Medicare Other | Admitting: Nurse Practitioner

## 2021-10-24 ENCOUNTER — Other Ambulatory Visit: Payer: Self-pay

## 2021-10-24 ENCOUNTER — Encounter: Payer: Self-pay | Admitting: Nurse Practitioner

## 2021-10-24 VITALS — BP 109/71 | HR 97 | Temp 98.5°F | Resp 16 | Ht 61.0 in | Wt 169.0 lb

## 2021-10-24 DIAGNOSIS — J440 Chronic obstructive pulmonary disease with acute lower respiratory infection: Secondary | ICD-10-CM | POA: Diagnosis not present

## 2021-10-24 DIAGNOSIS — F411 Generalized anxiety disorder: Secondary | ICD-10-CM

## 2021-10-24 DIAGNOSIS — J209 Acute bronchitis, unspecified: Secondary | ICD-10-CM

## 2021-10-24 DIAGNOSIS — J0100 Acute maxillary sinusitis, unspecified: Secondary | ICD-10-CM | POA: Diagnosis not present

## 2021-10-24 DIAGNOSIS — E1165 Type 2 diabetes mellitus with hyperglycemia: Secondary | ICD-10-CM | POA: Diagnosis not present

## 2021-10-24 LAB — POCT GLYCOSYLATED HEMOGLOBIN (HGB A1C): Hemoglobin A1C: 8.9 % — AB (ref 4.0–5.6)

## 2021-10-24 MED ORDER — FLUOXETINE HCL 40 MG PO CAPS: 40.0000 mg | ORAL_CAPSULE | Freq: Every day | ORAL | 1 refills | Status: DC

## 2021-10-24 MED ORDER — SEMAGLUTIDE(0.25 OR 0.5MG/DOS) 2 MG/1.5ML ~~LOC~~ SOPN: 0.2500 mg | PEN_INJECTOR | SUBCUTANEOUS | 2 refills | Status: DC

## 2021-10-24 MED ORDER — INSULIN PEN NEEDLE 31G X 5 MM MISC: 3 refills | Status: DC

## 2021-10-24 MED ORDER — LEVOFLOXACIN 500 MG PO TABS: 500.0000 mg | ORAL_TABLET | Freq: Every day | ORAL | 0 refills | Status: AC

## 2021-10-24 MED ORDER — FREESTYLE LIBRE 2 SENSOR MISC: 3 refills | Status: DC

## 2021-10-24 MED ORDER — DAPAGLIFLOZIN PROPANEDIOL 10 MG PO TABS: 10.0000 mg | ORAL_TABLET | Freq: Every day | ORAL | 1 refills | Status: DC

## 2021-10-24 NOTE — Progress Notes (Signed)
[facility] [place], [place] [address]  Internal MEDICINE  Office Visit Note  Patient Name: [name] [name]  [id number]  [id number]  Date of Service: 10/24/2021  Chief Complaint  Patient presents with   Follow-up    Chest congestion   Anxiety   Shortness of Breath   Fatigue   Cough   Nasal Congestion   Depression    HPI [name] [name] presents for a follow-up visit for chest congestion.  She reports having shortness of breath fatigue, cough, nasal congestion.  She reports that her symptoms started last week.  She reports also having fever, chills, runny nose, sinus pressure pain, headaches, wheezing and chest tightness.  She denies sore throat ear pain, diarrhea and vomiting.    Current Medication: Outpatient Encounter Medications as of 10/24/2021  Medication Sig   ACCU-CHEK AVIVA PLUS test strip    Accu-Chek Softclix Lancets lancets Use as instructed. DX E11.65   albuterol (VENTOLIN HFA) 108 (90 Base) MCG/ACT inhaler Inhale 1-2 puffs into the lungs every 6 (six) hours as needed for wheezing or shortness of breath.   ALPRAZolam (XANAX) 0.25 MG tablet Take one tab po bid for anxiety   ammonium lactate (LAC-HYDRIN) 12 % lotion Apply 1 application topically as needed for dry skin.   benzonatate (TESSALON) 100 MG capsule Take 1 capsule (100 mg total) by mouth 2 (two) times daily as needed for cough.   cetirizine (ZYRTEC) 10 MG tablet Take 1 tablet (10 mg total) by mouth daily.   Cholecalciferol 25 MCG (1000 UT) tablet Take 1 tablet (1,000 Units total) by mouth once a week.   Continuous Blood Gluc Sensor (FREESTYLE LIBRE 2 SENSOR) MISC Apply 1 device every 14 days   diclofenac Sodium (VOLTAREN) 1 % GEL SMARTSIG:2-4 Gram(s) Topical 1-4 Times Daily PRN   fenofibrate (TRICOR) 145 MG tablet Take by mouth.   FLUoxetine (PROZAC) 40 MG capsule Take 1 capsule (40 mg total) by mouth daily.   fluticasone (FLONASE) 50 MCG/ACT nasal spray Place 2 sprays into both  nostrils daily.   furosemide (LASIX) 20 MG tablet Take 20 mg by mouth. Take 1/2 tab as needed   HYDROmorphone (DILAUDID) 4 MG tablet Take by mouth every 6 (six) hours as needed for severe pain.   hydrOXYzine (VISTARIL) 25 MG capsule Take 1 capsule (25 mg total) by mouth every 8 (eight) hours as needed.   ibuprofen (ADVIL) 800 MG tablet Take 1 tablet (800 mg total) by mouth every 6 (six) hours as needed.   insulin glargine, 2 Unit Dial, (TOUJEO MAX SOLOSTAR) 300 UNIT/ML Solostar Pen Inject 35 Units into the skin daily.   Ipratropium-Albuterol (COMBIVENT RESPIMAT) 20-100 MCG/ACT AERS respimat Inhale 1 puff into the lungs every 6 (six) hours as needed for wheezing.   ipratropium-albuterol (DUONEB) 0.5-2.5 (3) MG/3ML SOLN Take 3 mLs by nebulization every 4 (four) hours as needed (wheezing,SOB and cough).   [EXPIRED] levofloxacin (LEVAQUIN) 500 MG tablet Take 1 tablet (500 mg total) by mouth daily for 5 days.   levothyroxine (SYNTHROID) 50 MCG tablet TAKE 1 TABLET BY MOUTH EVERY DAY   methocarbamol (ROBAXIN) 500 MG tablet PLEASE SEE ATTACHED FOR DETAILED DIRECTIONS   morphine (MS CONTIN) 60 MG 12 hr tablet Take 60 mg by mouth every 12 (twelve) hours.   Multiple Vitamin (MULTI-VITAMIN) tablet Take 1 tablet by mouth daily.   pantoprazole (PROTONIX) 40 MG tablet Take 1 tablet by mouth daily   pravastatin (PRAVACHOL) 10 MG tablet Take 1 tablet (10 mg total)  by mouth daily.   pregabalin (LYRICA) 150 MG capsule Take 150 mg by mouth 3 (three) times daily.    promethazine (PHENERGAN) 25 MG tablet Take 1 tablet (25 mg total) by mouth every 6 (six) hours as needed for nausea or vomiting.   Semaglutide,0.25 or 0.5MG /DOS, 2 MG/1.5ML SOPN Inject 0.25 mg into the skin once a week. For the first 4 weeks. Then increase to 0.5 mg once a week.   SUMAtriptan (IMITREX) 100 MG tablet Take 100 mg by mouth every 2 (two) hours as needed for migraine. May repeat in 2 hours if headache persists or recurs.   triamcinolone  ointment (KENALOG) 0.1 % Apply to affected area daily for two weeks   Vitamin D, Ergocalciferol, (DRISDOL) 1.25 MG (50000 UNIT) CAPS capsule Take 1 capsule (50,000 Units total) by mouth every 7 (seven) days.   [DISCONTINUED] dapagliflozin propanediol (FARXIGA) 10 MG TABS tablet Take 1 tablet (10 mg total) by mouth daily.   [DISCONTINUED] Insulin Pen Needle 31G X 5 MM MISC Use as directed  Once a day Dx e11.65   [DISCONTINUED] VICTOZA 18 MG/3ML SOPN ADMINISTER 1.8 MG UNDER THE SKIN EVERY DAY   dapagliflozin propanediol (FARXIGA) 10 MG TABS tablet Take 1 tablet (10 mg total) by mouth daily.   Insulin Pen Needle 31G X 5 MM MISC Use as directed  Once a day Dx e11.65   [DISCONTINUED] buPROPion (WELLBUTRIN XL) 150 MG 24 hr tablet Take 1 tablet (150 mg total) by mouth daily. (Patient not taking: Reported on 10/24/2021)   [DISCONTINUED] predniSONE (DELTASONE) 10 MG tablet Take one tab 3 x day for 3 days, then take one tab 2 x a day for 3 days and then take one tab a day for 3 days for copd (Patient not taking: Reported on 10/24/2021)   No facility-administered encounter medications on file as of 10/24/2021.    Surgical History: Past Surgical History:  Procedure Laterality Date   ABDOMINAL HYSTERECTOMY     CARPAL TUNNEL RELEASE     FOOT TENDON SURGERY Right    KNEE SURGERY     SPINAL FUSION     SPINAL FUSION      Medical History: Past Medical History:  Diagnosis Date   Actinic keratitis    Allergy    Anxiety    Asthma    Basal cell carcinoma    Chronic venous insufficiency    DDD (degenerative disc disease), lumbar    Diabetes mellitus without complication (HCC)    Fibromyalgia    Fibromyalgia    Hyperlipidemia    Lymphedema    Melanoma ([place])    Migraine    Mitral valve prolapse    Tendonitis of foot    left    Family History: Family History  Problem Relation Age of Onset   Cancer Paternal Aunt    Cancer Paternal Uncle    Diabetes Paternal Grandfather    Heart disease  Paternal Grandfather     Social History   Socioeconomic History   Marital status: Divorced    Spouse name: Not on file   Number of children: Not on file   Years of education: Not on file   Highest education level: Not on file  Occupational History   Not on file  Tobacco Use   Smoking status: Never   Smokeless tobacco: Never  Vaping Use   Vaping Use: Never used  Substance and Sexual Activity   Alcohol use: No   Drug use: No   Sexual activity: Not  on file  Other Topics Concern   Not on file  Social History Narrative   Not on file   Social Determinants of Health   Financial Resource Strain: Not on file  Food Insecurity: Not on file  Transportation Needs: Not on file  Physical Activity: Not on file  Stress: Not on file  Social Connections: Not on file  Intimate Partner Violence: Not on file      Review of Systems  Constitutional:  Positive for chills, fatigue and fever (was 100.5 over the weekend.). Negative for diaphoresis.  HENT:  Positive for congestion, rhinorrhea, sinus pressure and sinus pain. Negative for ear pain and sore throat.   Respiratory:  Positive for cough, chest tightness, shortness of breath and wheezing.   Cardiovascular: Negative.  Negative for chest pain and palpitations.  Gastrointestinal:  Positive for nausea. Negative for abdominal pain, constipation, diarrhea and vomiting.  Musculoskeletal:  Positive for myalgias.  Skin:  Negative for rash.  Neurological:  Positive for headaches.   Vital Signs: BP 109/71   Pulse 97   Temp 98.5 F (36.9 C)   Resp 16   Ht 5\' 1"  (1.549 m)   Wt 169 lb (76.7 kg)   LMP 12/13/1991   SpO2 98%   BMI 31.93 kg/m    Physical Exam Constitutional:      General: She is not in acute distress.    Appearance: Normal appearance. She is well-developed and normal weight. She is not ill-appearing.  HENT:     Head: Normocephalic and atraumatic.     Right Ear: Tympanic membrane, ear canal and external ear normal.      Left Ear: Tympanic membrane, ear canal and external ear normal.     Nose: Congestion and rhinorrhea present.     Mouth/Throat:     Mouth: Mucous membranes are moist.     Pharynx: Posterior oropharyngeal erythema present. No oropharyngeal exudate.  Eyes:     Pupils: Pupils are equal, round, and reactive to light.  Cardiovascular:     Rate and Rhythm: Normal rate and regular rhythm.  Pulmonary:     Effort: Pulmonary effort is normal. No respiratory distress.     Breath sounds: No wheezing or rhonchi.  Lymphadenopathy:     Cervical: Cervical adenopathy present.  Neurological:     Mental Status: She is alert and oriented to person, place, and time.     Cranial Nerves: No cranial nerve deficit.     Coordination: Coordination normal.     Gait: Gait normal.  Psychiatric:        Mood and Affect: Mood normal.        Behavior: Behavior normal.       Assessment/Plan: 1. Uncontrolled type 2 diabetes mellitus with hyperglycemia (HCC) A1c is elevated,  - POCT HgB A1C - Semaglutide,0.25 or 0.5MG /DOS, 2 MG/1.5ML SOPN; Inject 0.25 mg into the skin once a week. For the first 4 weeks. Then increase to 0.5 mg once a week.  Dispense: 3 mL; Refill: 2/ diet has not been ideal and she has been under stress. She plans to work on diet modifications. Will discuss adjusting medication if no improvement in A1C in 3 months.  - Insulin Pen Needle 31G X 5 MM MISC; Use as directed  Once a day Dx e11.65  Dispense: 100 each; Refill: 3 - dapagliflozin propanediol (FARXIGA) 10 MG TABS tablet; Take 1 tablet (10 mg total) by mouth daily.  Dispense: 90 tablet; Refill: 1 - Continuous Blood Gluc Sensor (FREESTYLE LIBRE  2 SENSOR) MISC; Apply 1 device every 14 days  Dispense: 3 each; Refill: 3  2. Acute non-recurrent maxillary sinusitis Empiric antibiotic treatment prescribed.  - levofloxacin (LEVAQUIN) 500 MG tablet; Take 1 tablet (500 mg total) by mouth daily for 5 days.  Dispense: 5 tablet; Refill: 0  3. Acute  bronchitis with COPD ([place]) See problem #2 - levofloxacin (LEVAQUIN) 500 MG tablet; Take 1 tablet (500 mg total) by mouth daily for 5 days.  Dispense: 5 tablet; Refill: 0  4. GAD (generalized anxiety disorder) Stable, refills ordered.  - FLUoxetine (PROZAC) 40 MG capsule; Take 1 capsule (40 mg total) by mouth daily.  Dispense: 90 capsule; Refill: 1   General Counseling: Congetta verbalizes understanding of the findings of todays visit and agrees with plan of treatment. I have discussed any further diagnostic evaluation that may be needed or ordered today. We also reviewed her medications today. she has been encouraged to call the office with any questions or concerns that should arise related to todays visit.    Orders Placed This Encounter  Procedures   POCT HgB A1C    Meds ordered this encounter  Medications   Semaglutide,0.25 or 0.5MG /DOS, 2 MG/1.5ML SOPN    Sig: Inject 0.25 mg into the skin once a week. For the first 4 weeks. Then increase to 0.5 mg once a week.    Dispense:  3 mL    Refill:  2    Discontinue victoza, patient to start ozempic   Insulin Pen Needle 31G X 5 MM MISC    Sig: Use as directed  Once a day Dx e11.65    Dispense:  100 each    Refill:  3   dapagliflozin propanediol (FARXIGA) 10 MG TABS tablet    Sig: Take 1 tablet (10 mg total) by mouth daily.    Dispense:  90 tablet    Refill:  1   FLUoxetine (PROZAC) 40 MG capsule    Sig: Take 1 capsule (40 mg total) by mouth daily.    Dispense:  90 capsule    Refill:  1   Continuous Blood Gluc Sensor (FREESTYLE LIBRE 2 SENSOR) MISC    Sig: Apply 1 device every 14 days    Dispense:  3 each    Refill:  3    Patient is on basal insulin and ozempic injections, may need prior authorization for sensors.   levofloxacin (LEVAQUIN) 500 MG tablet    Sig: Take 1 tablet (500 mg total) by mouth daily for 5 days.    Dispense:  5 tablet    Refill:  0    Return in about 3 months (around 01/24/2022) for F/U, Recheck A1C,  Ellysia Char PCP.   Total time spent:30 Minutes Time spent includes review of chart, medications, test results, and follow up plan with the patient.   [place] Controlled Substance Database was reviewed by me.  This patient was seen by Jonetta Osgood, FNP-C in collaboration with Dr. Clayborn Bigness as a part of collaborative care agreement.   Archie Atilano R. Valetta Fuller, MSN, FNP-C Internal medicine

## 2021-10-27 DIAGNOSIS — M545 Low back pain, unspecified: Secondary | ICD-10-CM | POA: Diagnosis not present

## 2021-10-27 DIAGNOSIS — M25562 Pain in left knee: Secondary | ICD-10-CM | POA: Diagnosis not present

## 2021-10-27 DIAGNOSIS — Z79899 Other long term (current) drug therapy: Secondary | ICD-10-CM | POA: Diagnosis not present

## 2021-10-27 DIAGNOSIS — G8929 Other chronic pain: Secondary | ICD-10-CM | POA: Diagnosis not present

## 2021-10-27 DIAGNOSIS — M62838 Other muscle spasm: Secondary | ICD-10-CM | POA: Diagnosis not present

## 2021-11-04 ENCOUNTER — Encounter: Payer: Self-pay | Admitting: Nurse Practitioner

## 2021-11-14 ENCOUNTER — Telehealth: Payer: Medicare Other

## 2021-11-16 ENCOUNTER — Telehealth: Payer: Medicare Other

## 2021-11-20 ENCOUNTER — Encounter: Payer: Self-pay | Admitting: Nurse Practitioner

## 2021-12-01 DIAGNOSIS — Z79899 Other long term (current) drug therapy: Secondary | ICD-10-CM | POA: Diagnosis not present

## 2021-12-01 DIAGNOSIS — M25562 Pain in left knee: Secondary | ICD-10-CM | POA: Diagnosis not present

## 2021-12-01 DIAGNOSIS — M545 Low back pain, unspecified: Secondary | ICD-10-CM | POA: Diagnosis not present

## 2021-12-01 DIAGNOSIS — M62838 Other muscle spasm: Secondary | ICD-10-CM | POA: Diagnosis not present

## 2021-12-01 DIAGNOSIS — G8929 Other chronic pain: Secondary | ICD-10-CM | POA: Diagnosis not present

## 2021-12-03 IMAGING — MR MR ANKLE*R* W/O CM
6 series · 40 of 40 positions shown · non-contrast
Comparison: X-ray 10/21/2020

CLINICAL DATA: Ankle pain and weakness. Concern for Achilles tendon
injury

EXAM:
MRI OF THE RIGHT ANKLE WITHOUT CONTRAST
TECHNIQUE: Multiplanar, multisequence MR imaging of the ankle was performed. No
intravenous contrast was administered.

[Series 3: T2 fat-sat · axial · 3.0mm · 0.50mm/px · z∈[-109,+42]mm · 8 of 39 slices shown (1 of 2)]
[im 1/39]
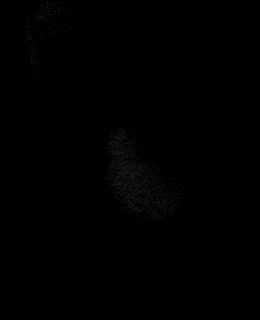
[im 6/39]
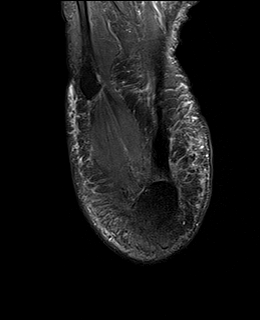
[im 11/39]
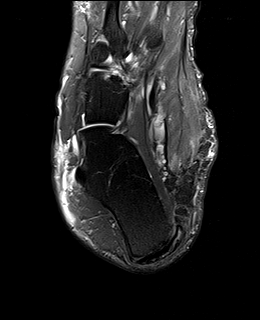
[im 17/39]
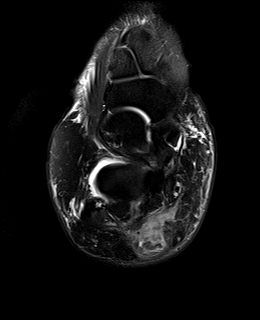
[im 22/39]
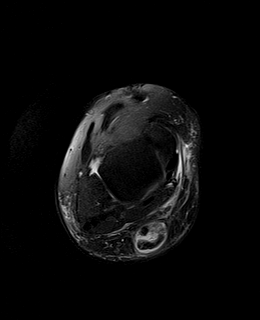
[im 28/39]
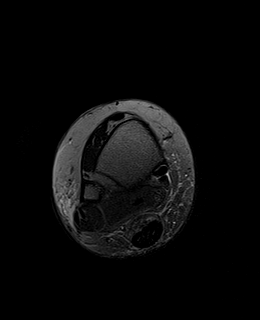
[im 33/39]
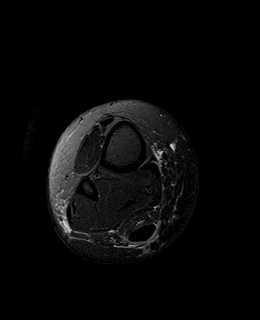
[im 39/39]
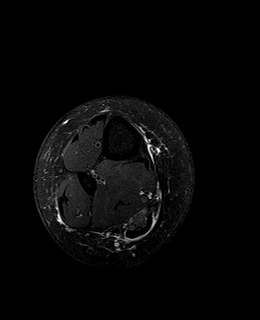

[Series 5: T2 fat-sat · coronal · 3.0mm · 0.62mm/px · 8 of 39 slices shown (2 of 2)]
[im 1/39]
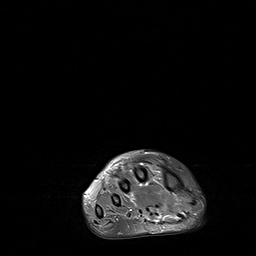
[im 6/39]
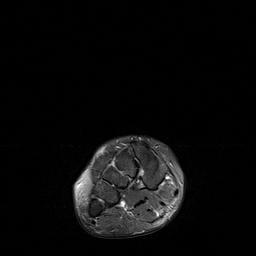
[im 11/39]
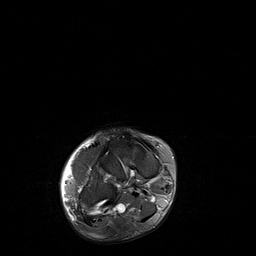
[im 17/39]
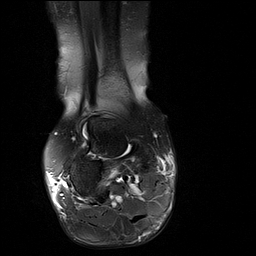
[im 22/39]
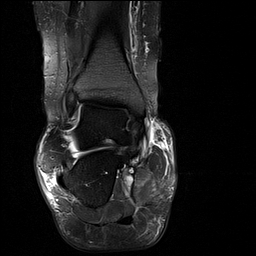
[im 28/39]
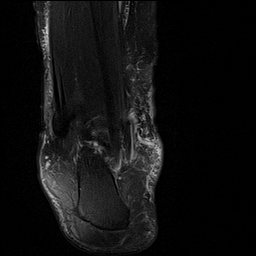
[im 33/39]
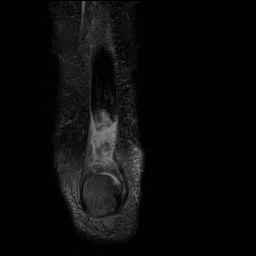
[im 39/39]
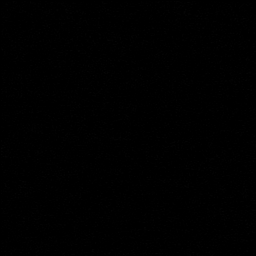

[Series 6: T1 · sagittal · 4.0mm · 0.56mm/px · 4 of 22 slices shown (1 of 2)]
[im 1/22]
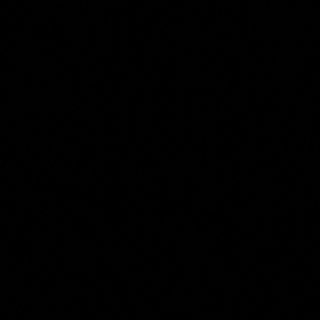
[im 8/22]
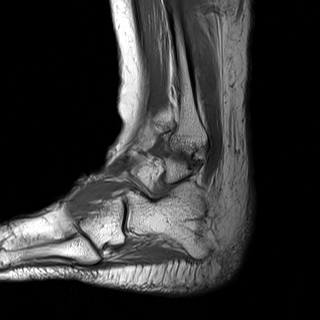
[im 15/22]
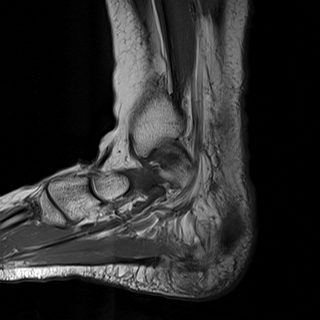
[im 22/22]
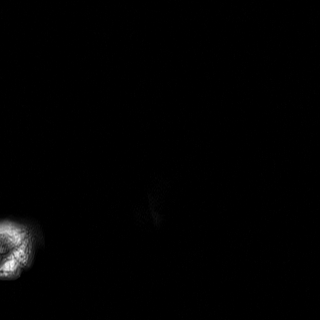

[Series 7: STIR · sagittal · 4.0mm · 0.70mm/px · 4 of 22 slices shown]
[im 1/22]
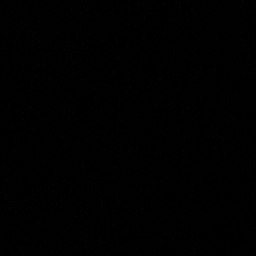
[im 8/22]
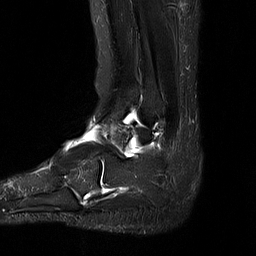
[im 15/22]
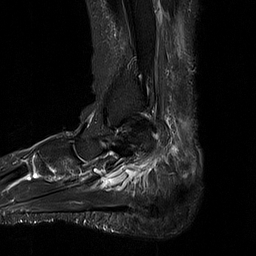
[im 22/22]
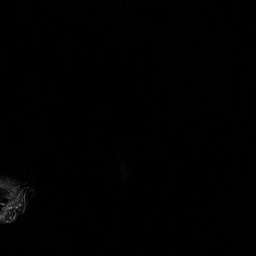

[Series 8: T1 · axial · 3.0mm · 0.50mm/px · z∈[-109,+42]mm · 8 of 39 slices shown (2 of 2)]
[im 1/39]
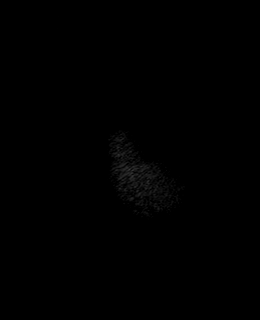
[im 6/39]
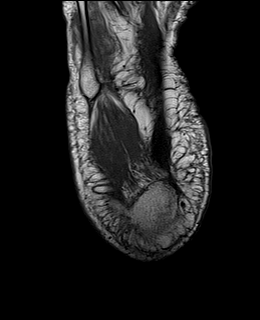
[im 11/39]
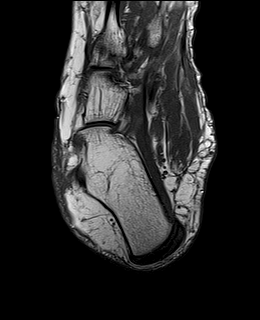
[im 17/39]
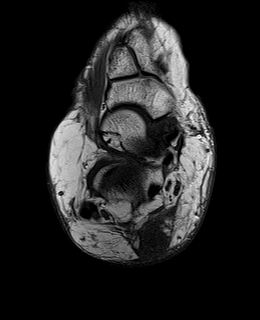
[im 22/39]
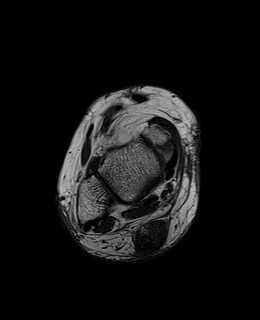
[im 28/39]
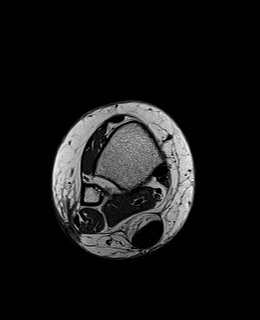
[im 33/39]
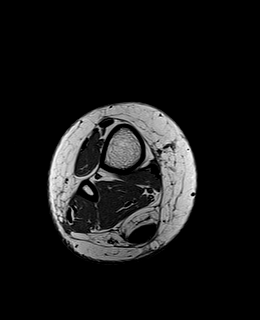
[im 39/39]
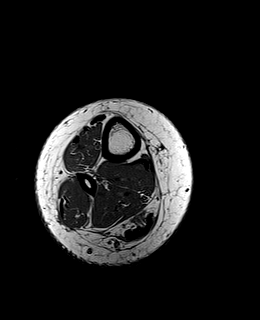

[Series 9: PD fat-sat · axial · 3.0mm · 0.56mm/px · z∈[-108,+43]mm · 8 of 39 slices shown]
[im 1/39]
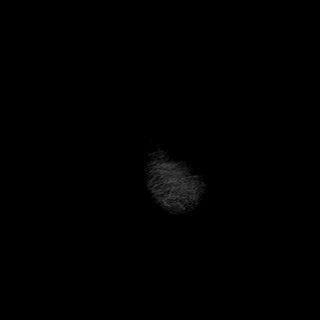
[im 6/39]
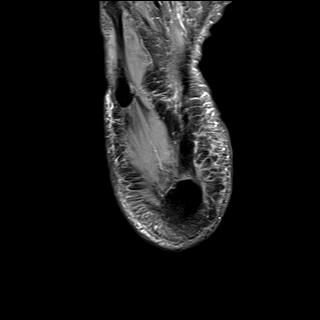
[im 11/39]
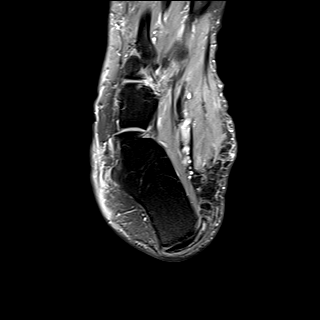
[im 17/39]
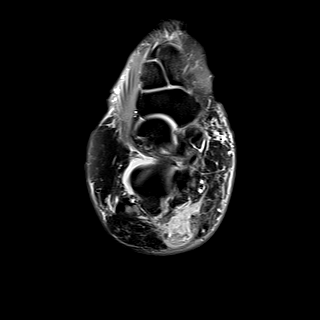
[im 22/39]
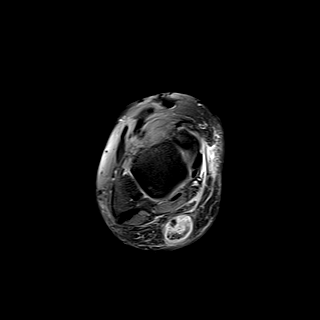
[im 28/39]
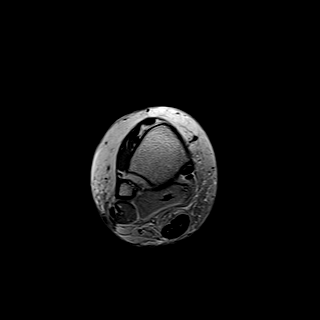
[im 33/39]
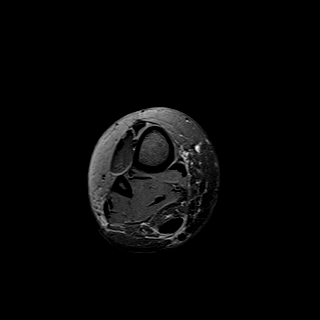
[im 39/39]
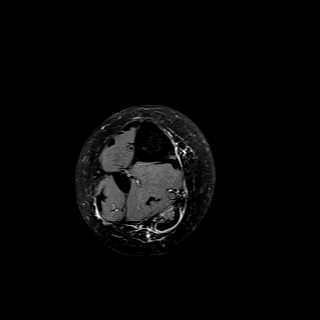

[40 of 40 positions shown; findings below may reference images not displayed]

FINDINGS: TENDONS

Peroneal: Intact peroneus longus and peroneus brevis tendons.

Posteromedial: Intact tibialis posterior, flexor hallucis longus and
flexor digitorum longus tendons.

Anterior: Intact tibialis anterior, extensor hallucis longus and
extensor digitorum longus tendons.

Achilles: Complete tear of the Achilles tendon approximately 1 cm
proximal to its distal insertion. There is retraction of up to
approximately 3.5 cm with fluid/hemorrhage gap. The proximal
retracted end of the torn tendon is thickened and heterogeneous
compatible with underlying tendinosis. No osseous avulsion.

Plantar Fascia: Fusiform thickening of the central band of the
plantar fascia without tear or perifascial edema.

LIGAMENTS

Lateral: Thickened appearance of the anterior talofibular ligament
without evidence of acute tear. The anterior and posterior
tibiofibular ligaments are intact. The posterior talofibular and
calcaneofibular ligaments appear intact.

Medial: Deltoid ligament and spring ligament complex intact.

CARTILAGE

Ankle Joint: No joint effusion or chondral defect.

Subtalar Joints/Sinus Tarsi: No chondral defect. Trace subtalar
effusion. Preservation of the anatomic fat within the sinus tarsi.

Bones: No acute fracture. No dislocation. No marrow edema within the
calcaneal tuberosity. No significant arthropathy.

Other: Subcutaneous edema at the hindfoot.
IMPRESSION: 1. Complete tear of the distal Achilles tendon with approximately
3.5 cm of retraction. No bony avulsion injury.
2. Findings suggestive of remote ATFL injury.
3. Fusiform thickening of the central band of the plantar fascia
without tear or perifascial edema suggesting sequela of plantar
fasciitis.

## 2021-12-15 DIAGNOSIS — Z20822 Contact with and (suspected) exposure to covid-19: Secondary | ICD-10-CM | POA: Diagnosis not present

## 2021-12-21 DIAGNOSIS — E039 Hypothyroidism, unspecified: Secondary | ICD-10-CM | POA: Diagnosis not present

## 2021-12-21 DIAGNOSIS — R5383 Other fatigue: Secondary | ICD-10-CM | POA: Diagnosis not present

## 2021-12-22 LAB — CBC WITH DIFFERENTIAL/PLATELET
Basophils Absolute: 0.1 10*3/uL (ref 0.0–0.2)
Basos: 1 %
EOS (ABSOLUTE): 0.3 10*3/uL (ref 0.0–0.4)
Eos: 4 %
Hematocrit: 44.2 % (ref 34.0–46.6)
Hemoglobin: 15 g/dL (ref 11.1–15.9)
Immature Grans (Abs): 0 10*3/uL (ref 0.0–0.1)
Immature Granulocytes: 0 %
Lymphocytes Absolute: 1.8 10*3/uL (ref 0.7–3.1)
Lymphs: 30 %
MCH: 29.1 pg (ref 26.6–33.0)
MCHC: 33.9 g/dL (ref 31.5–35.7)
MCV: 86 fL (ref 79–97)
Monocytes Absolute: 0.3 10*3/uL (ref 0.1–0.9)
Monocytes: 5 %
Neutrophils Absolute: 3.6 10*3/uL (ref 1.4–7.0)
Neutrophils: 60 %
Platelets: 337 10*3/uL (ref 150–450)
RBC: 5.15 x10E6/uL (ref 3.77–5.28)
RDW: 12.9 % (ref 11.7–15.4)
WBC: 6.1 10*3/uL (ref 3.4–10.8)

## 2021-12-22 LAB — COMPREHENSIVE METABOLIC PANEL
ALT: 16 IU/L (ref 0–32)
AST: 16 IU/L (ref 0–40)
Albumin/Globulin Ratio: 1.9 (ref 1.2–2.2)
Albumin: 4.3 g/dL (ref 3.8–4.8)
Alkaline Phosphatase: 135 IU/L — ABNORMAL HIGH (ref 44–121)
BUN/Creatinine Ratio: 11 (ref 9–23)
BUN: 9 mg/dL (ref 6–24)
Bilirubin Total: 0.4 mg/dL (ref 0.0–1.2)
CO2: 25 mmol/L (ref 20–29)
Calcium: 8.9 mg/dL (ref 8.7–10.2)
Chloride: 98 mmol/L (ref 96–106)
Creatinine, Ser: 0.83 mg/dL (ref 0.57–1.00)
Globulin, Total: 2.3 g/dL (ref 1.5–4.5)
Glucose: 147 mg/dL — ABNORMAL HIGH (ref 70–99)
Potassium: 4 mmol/L (ref 3.5–5.2)
Sodium: 137 mmol/L (ref 134–144)
Total Protein: 6.6 g/dL (ref 6.0–8.5)
eGFR: 87 mL/min/{1.73_m2} (ref >59)

## 2021-12-22 LAB — LIPID PANEL WITH LDL/HDL RATIO
Cholesterol, Total: 305 mg/dL — ABNORMAL HIGH (ref 100–199)
HDL: 45 mg/dL (ref >39)
LDL Chol Calc (NIH): 192 mg/dL — ABNORMAL HIGH (ref 0–99)
LDL/HDL Ratio: 4.3 ratio — ABNORMAL HIGH (ref 0.0–3.2)
Triglycerides: 341 mg/dL — ABNORMAL HIGH (ref 0–149)
VLDL Cholesterol Cal: 68 mg/dL — ABNORMAL HIGH (ref 5–40)

## 2021-12-22 LAB — TSH+FREE T4
Free T4: 1.16 ng/dL (ref 0.82–1.77)
TSH: 0.766 u[IU]/mL (ref 0.450–4.500)

## 2021-12-26 ENCOUNTER — Encounter: Payer: Self-pay | Admitting: Nurse Practitioner

## 2021-12-26 ENCOUNTER — Other Ambulatory Visit: Payer: Self-pay | Admitting: Internal Medicine

## 2021-12-26 MED ORDER — ALPRAZOLAM 0.25 MG PO TABS: ORAL_TABLET | ORAL | 0 refills | Status: DC

## 2021-12-28 ENCOUNTER — Ambulatory Visit: Payer: Medicare Other | Admitting: Nurse Practitioner

## 2022-01-02 DIAGNOSIS — M25562 Pain in left knee: Secondary | ICD-10-CM | POA: Diagnosis not present

## 2022-01-02 DIAGNOSIS — M62838 Other muscle spasm: Secondary | ICD-10-CM | POA: Diagnosis not present

## 2022-01-02 DIAGNOSIS — M545 Low back pain, unspecified: Secondary | ICD-10-CM | POA: Diagnosis not present

## 2022-01-02 DIAGNOSIS — G8929 Other chronic pain: Secondary | ICD-10-CM | POA: Diagnosis not present

## 2022-01-02 DIAGNOSIS — Z79899 Other long term (current) drug therapy: Secondary | ICD-10-CM | POA: Diagnosis not present

## 2022-01-04 DIAGNOSIS — Z79899 Other long term (current) drug therapy: Secondary | ICD-10-CM | POA: Diagnosis not present

## 2022-01-13 NOTE — Progress Notes (Signed)
I have reviewed the lab results. There are no critically abnormal values requiring immediate intervention but there are some abnormals that will be discussed at the next office visit on 01/30/22.

## 2022-01-16 ENCOUNTER — Other Ambulatory Visit: Payer: Self-pay | Admitting: Physician Assistant

## 2022-01-18 ENCOUNTER — Other Ambulatory Visit: Payer: Self-pay

## 2022-01-18 DIAGNOSIS — E1165 Type 2 diabetes mellitus with hyperglycemia: Secondary | ICD-10-CM

## 2022-01-18 MED ORDER — SEMAGLUTIDE(0.25 OR 0.5MG/DOS) 2 MG/1.5ML ~~LOC~~ SOPN: 0.2500 mg | PEN_INJECTOR | SUBCUTANEOUS | 2 refills | Status: DC

## 2022-01-30 ENCOUNTER — Encounter: Payer: Self-pay | Admitting: Nurse Practitioner

## 2022-01-30 ENCOUNTER — Other Ambulatory Visit: Payer: Self-pay | Admitting: Nurse Practitioner

## 2022-01-30 ENCOUNTER — Other Ambulatory Visit: Payer: Self-pay

## 2022-01-30 ENCOUNTER — Ambulatory Visit (INDEPENDENT_AMBULATORY_CARE_PROVIDER_SITE_OTHER): Payer: Medicare Other | Admitting: Nurse Practitioner

## 2022-01-30 VITALS — BP 130/88 | HR 85 | Temp 98.8°F | Resp 16 | Ht 61.0 in | Wt 156.8 lb

## 2022-01-30 DIAGNOSIS — Z1231 Encounter for screening mammogram for malignant neoplasm of breast: Secondary | ICD-10-CM | POA: Diagnosis not present

## 2022-01-30 DIAGNOSIS — E039 Hypothyroidism, unspecified: Secondary | ICD-10-CM

## 2022-01-30 DIAGNOSIS — J3089 Other allergic rhinitis: Secondary | ICD-10-CM | POA: Diagnosis not present

## 2022-01-30 DIAGNOSIS — F411 Generalized anxiety disorder: Secondary | ICD-10-CM

## 2022-01-30 DIAGNOSIS — E782 Mixed hyperlipidemia: Secondary | ICD-10-CM

## 2022-01-30 DIAGNOSIS — E1165 Type 2 diabetes mellitus with hyperglycemia: Secondary | ICD-10-CM

## 2022-01-30 DIAGNOSIS — F331 Major depressive disorder, recurrent, moderate: Secondary | ICD-10-CM

## 2022-01-30 DIAGNOSIS — R300 Dysuria: Secondary | ICD-10-CM | POA: Diagnosis not present

## 2022-01-30 DIAGNOSIS — Z0001 Encounter for general adult medical examination with abnormal findings: Secondary | ICD-10-CM | POA: Diagnosis not present

## 2022-01-30 DIAGNOSIS — F4321 Adjustment disorder with depressed mood: Secondary | ICD-10-CM

## 2022-01-30 LAB — POCT GLYCOSYLATED HEMOGLOBIN (HGB A1C): Hemoglobin A1C: 6.5 % — AB (ref 4.0–5.6)

## 2022-01-30 MED ORDER — TOUJEO MAX SOLOSTAR 300 UNIT/ML ~~LOC~~ SOPN: 35.0000 [IU] | PEN_INJECTOR | Freq: Every day | SUBCUTANEOUS | 1 refills | Status: DC

## 2022-01-30 MED ORDER — LEVOTHYROXINE SODIUM 50 MCG PO TABS: 50.0000 ug | ORAL_TABLET | Freq: Every day | ORAL | 0 refills | Status: DC

## 2022-01-30 MED ORDER — FLUOXETINE HCL 20 MG PO CAPS: 20.0000 mg | ORAL_CAPSULE | Freq: Every day | ORAL | 3 refills | Status: DC

## 2022-01-30 MED ORDER — DAPAGLIFLOZIN PROPANEDIOL 10 MG PO TABS: 10.0000 mg | ORAL_TABLET | Freq: Every day | ORAL | 1 refills | Status: DC

## 2022-01-30 MED ORDER — PRAVASTATIN SODIUM 10 MG PO TABS: 10.0000 mg | ORAL_TABLET | Freq: Every day | ORAL | 2 refills | Status: DC

## 2022-01-30 MED ORDER — CETIRIZINE HCL 10 MG PO TABS: 10.0000 mg | ORAL_TABLET | Freq: Every day | ORAL | 1 refills | Status: DC

## 2022-01-30 MED ORDER — ACCU-CHEK AVIVA PLUS VI STRP: ORAL_STRIP | 3 refills | Status: DC

## 2022-01-30 MED ORDER — FLUOXETINE HCL 40 MG PO CAPS: 40.0000 mg | ORAL_CAPSULE | Freq: Every day | ORAL | 1 refills | Status: DC

## 2022-01-30 MED ORDER — INSULIN PEN NEEDLE 31G X 5 MM MISC: 3 refills | Status: DC

## 2022-01-30 MED ORDER — ACCU-CHEK SOFTCLIX LANCETS MISC: 3 refills | Status: DC

## 2022-01-30 MED ORDER — ALPRAZOLAM 0.25 MG PO TABS: ORAL_TABLET | ORAL | 0 refills | Status: DC

## 2022-01-30 NOTE — Progress Notes (Unsigned)
[facility] [place], [place] [address]  Internal MEDICINE  Office Visit Note  Patient Name: [name] [name]  [id number]  [id number]  Date of Service: 01/30/2022  Chief Complaint  Patient presents with   Medicare Wellness    Ptthrew up Friday night and had diarrhea Saturday morning    Generalized Body Aches   Nausea   Fatigue   Diabetes   Hyperlipidemia   Asthma   Anxiety    Diabetes Pertinent negatives for hypoglycemia include no dizziness, headaches, nervousness/anxiousness or seizures. Pertinent negatives for diabetes include no chest pain and no fatigue.  Hyperlipidemia Pertinent negatives include no chest pain, myalgias or shortness of breath.  Asthma There is no cough, shortness of breath or wheezing. Pertinent negatives include no appetite change, chest pain, ear pain, fever, headaches, myalgias, rhinorrhea, sore throat or trouble swallowing. Her past medical history is significant for asthma.  Anxiety Patient reports no chest pain, dizziness, nausea, nervous/anxious behavior, shortness of breath or suicidal ideas.   Her past medical history is significant for asthma.  Mintie presents for an annual well visit and physical exam. she has a history of ____.  -age appropriate screenings and immunizations as appropriate -COVID vacc status -current problems, concerns.  Medication refills Routine labs Work and home life:  Pain:  Lost 13 lbs, A1C is 6.5 improved from 8.9 in November.  Lipid abnormal, already working on diet.    Current Medication: Outpatient Encounter Medications as of 01/30/2022  Medication Sig   albuterol (VENTOLIN HFA) 108 (90 Base) MCG/ACT inhaler Inhale 1-2 puffs into the lungs every 6 (six) hours as needed for wheezing or shortness of breath.   ammonium lactate (LAC-HYDRIN) 12 % lotion Apply 1 application topically as needed for dry skin.   benzonatate (TESSALON) 100 MG capsule Take 1 capsule (100 mg total) by mouth 2  (two) times daily as needed for cough.   Cholecalciferol 25 MCG (1000 UT) tablet Take 1 tablet (1,000 Units total) by mouth once a week.   diclofenac Sodium (VOLTAREN) 1 % GEL SMARTSIG:2-4 Gram(s) Topical 1-4 Times Daily PRN   fenofibrate (TRICOR) 145 MG tablet Take by mouth.   FLUoxetine (PROZAC) 20 MG capsule Take 1 capsule (20 mg total) by mouth daily.   fluticasone (FLONASE) 50 MCG/ACT nasal spray Place 2 sprays into both nostrils daily.   HYDROmorphone (DILAUDID) 4 MG tablet Take by mouth every 6 (six) hours as needed for severe pain.   hydrOXYzine (VISTARIL) 25 MG capsule Take 1 capsule (25 mg total) by mouth every 8 (eight) hours as needed.   ibuprofen (ADVIL) 800 MG tablet Take 1 tablet (800 mg total) by mouth every 6 (six) hours as needed.   Ipratropium-Albuterol (COMBIVENT RESPIMAT) 20-100 MCG/ACT AERS respimat Inhale 1 puff into the lungs every 6 (six) hours as needed for wheezing.   ipratropium-albuterol (DUONEB) 0.5-2.5 (3) MG/3ML SOLN Take 3 mLs by nebulization every 4 (four) hours as needed (wheezing,SOB and cough).   methocarbamol (ROBAXIN) 500 MG tablet PLEASE SEE ATTACHED FOR DETAILED DIRECTIONS   morphine (MS CONTIN) 60 MG 12 hr tablet Take 60 mg by mouth every 12 (twelve) hours.   Multiple Vitamin (MULTI-VITAMIN) tablet Take 1 tablet by mouth daily.   pantoprazole (PROTONIX) 40 MG tablet TAKE 1 TABLET BY MOUTH EVERY DAY   pregabalin (LYRICA) 150 MG capsule Take 150 mg by mouth 3 (three) times daily.    promethazine (PHENERGAN) 25 MG tablet Take 1 tablet (25 mg total) by mouth every 6 (  six) hours as needed for nausea or vomiting.   Semaglutide,0.25 or 0.5MG /DOS, 2 MG/1.5ML SOPN Inject 0.25 mg into the skin once a week. For the first 4 weeks. Then increase to 0.5 mg once a week.   SUMAtriptan (IMITREX) 100 MG tablet Take 100 mg by mouth every 2 (two) hours as needed for migraine. May repeat in 2 hours if headache persists or recurs.   triamcinolone ointment (KENALOG) 0.1 % Apply  to affected area daily for two weeks   Vitamin D, Ergocalciferol, (DRISDOL) 1.25 MG (50000 UNIT) CAPS capsule Take 1 capsule (50,000 Units total) by mouth every 7 (seven) days.   [DISCONTINUED] ACCU-CHEK AVIVA PLUS test strip    [DISCONTINUED] Accu-Chek Softclix Lancets lancets Use as instructed. DX E11.65   [DISCONTINUED] ALPRAZolam (XANAX) 0.25 MG tablet Take one tab po bid for anxiety   [DISCONTINUED] cetirizine (ZYRTEC) 10 MG tablet Take 1 tablet (10 mg total) by mouth daily.   [DISCONTINUED] Continuous Blood Gluc Sensor (FREESTYLE LIBRE 2 SENSOR) MISC Apply 1 device every 14 days   [DISCONTINUED] dapagliflozin propanediol (FARXIGA) 10 MG TABS tablet Take 1 tablet (10 mg total) by mouth daily.   [DISCONTINUED] FLUoxetine (PROZAC) 40 MG capsule Take 1 capsule (40 mg total) by mouth daily.   [DISCONTINUED] furosemide (LASIX) 20 MG tablet Take 20 mg by mouth. Take 1/2 tab as needed   [DISCONTINUED] insulin glargine, 2 Unit Dial, (TOUJEO MAX SOLOSTAR) 300 UNIT/ML Solostar Pen Inject 35 Units into the skin daily.   [DISCONTINUED] Insulin Pen Needle 31G X 5 MM MISC Use as directed  Once a day Dx e11.65   [DISCONTINUED] levothyroxine (SYNTHROID) 50 MCG tablet TAKE 1 TABLET BY MOUTH EVERY DAY   [DISCONTINUED] pravastatin (PRAVACHOL) 10 MG tablet Take 1 tablet (10 mg total) by mouth daily.   ACCU-CHEK AVIVA PLUS test strip Use 1 strip to check glucose level at least once daily E11.65   Accu-Chek Softclix Lancets lancets Use as instructed. DX E11.65   ALPRAZolam (XANAX) 0.25 MG tablet Take one tab po bid for anxiety   cetirizine (ZYRTEC) 10 MG tablet Take 1 tablet (10 mg total) by mouth daily.   dapagliflozin propanediol (FARXIGA) 10 MG TABS tablet Take 1 tablet (10 mg total) by mouth daily.   FLUoxetine (PROZAC) 40 MG capsule Take 1 capsule (40 mg total) by mouth daily.   insulin glargine, 2 Unit Dial, (TOUJEO MAX SOLOSTAR) 300 UNIT/ML Solostar Pen Inject 35 Units into the skin daily.   Insulin Pen  Needle 31G X 5 MM MISC Use as directed  Once a day Dx e11.65   levothyroxine (SYNTHROID) 50 MCG tablet Take 1 tablet (50 mcg total) by mouth daily.   pravastatin (PRAVACHOL) 10 MG tablet Take 1 tablet (10 mg total) by mouth daily.   No facility-administered encounter medications on file as of 01/30/2022.    Surgical History: Past Surgical History:  Procedure Laterality Date   ABDOMINAL HYSTERECTOMY     CARPAL TUNNEL RELEASE     FOOT TENDON SURGERY Right    KNEE SURGERY     SPINAL FUSION     SPINAL FUSION      Medical History: Past Medical History:  Diagnosis Date   Actinic keratitis    Allergy    Anxiety    Asthma    Basal cell carcinoma    Chronic venous insufficiency    DDD (degenerative disc disease), lumbar    Diabetes mellitus without complication (HCC)    Fibromyalgia    Fibromyalgia  Hyperlipidemia    Lymphedema    Melanoma ([place])    Migraine    Mitral valve prolapse    Tendonitis of foot    left    Family History: Family History  Problem Relation Age of Onset   Cancer Paternal Aunt    Cancer Paternal Uncle    Diabetes Paternal Grandfather    Heart disease Paternal Grandfather     Social History   Socioeconomic History   Marital status: Divorced    Spouse name: Not on file   Number of children: Not on file   Years of education: Not on file   Highest education level: Not on file  Occupational History   Not on file  Tobacco Use   Smoking status: Never   Smokeless tobacco: Never  Vaping Use   Vaping Use: Never used  Substance and Sexual Activity   Alcohol use: No   Drug use: No   Sexual activity: Not on file  Other Topics Concern   Not on file  Social History Narrative   Not on file   Social Determinants of Health   Financial Resource Strain: Not on file  Food Insecurity: Not on file  Transportation Needs: Not on file  Physical Activity: Not on file  Stress: Not on file  Social Connections: Not on file  Intimate Partner Violence:  Not on file      Review of Systems  Constitutional:  Negative for activity change, appetite change, chills, fatigue, fever and unexpected weight change.  HENT: Negative.  Negative for congestion, ear pain, rhinorrhea, sore throat and trouble swallowing.   Eyes: Negative.   Respiratory: Negative.  Negative for cough, chest tightness, shortness of breath and wheezing.   Cardiovascular: Negative.  Negative for chest pain.  Gastrointestinal: Negative.  Negative for abdominal pain, blood in stool, constipation, diarrhea, nausea and vomiting.  Endocrine: Negative.   Genitourinary: Negative.  Negative for difficulty urinating, dysuria, frequency, hematuria and urgency.  Musculoskeletal: Negative.  Negative for arthralgias, back pain, joint swelling, myalgias and neck pain.  Skin: Negative.  Negative for rash and wound.  Allergic/Immunologic: Negative.  Negative for immunocompromised state.  Neurological: Negative.  Negative for dizziness, seizures, numbness and headaches.  Hematological: Negative.   Psychiatric/Behavioral: Negative.  Negative for behavioral problems, self-injury and suicidal ideas. The patient is not nervous/anxious.    Vital Signs: BP 130/88    Pulse 85    Temp 98.8 F (37.1 C)    Resp 16    Ht 5\' 1"  (1.549 m)    Wt 156 lb 12.8 oz (71.1 kg)    LMP 12/13/1991    SpO2 97%    BMI 29.63 kg/m    Physical Exam Vitals reviewed.  Constitutional:      General: She is not in acute distress.    Appearance: She is well-developed. She is not diaphoretic.  HENT:     Head: Normocephalic and atraumatic.     Right Ear: External ear normal.     Left Ear: External ear normal.     Nose: Nose normal.     Mouth/Throat:     Pharynx: No oropharyngeal exudate.  Eyes:     General: No scleral icterus.       Right eye: No discharge.        Left eye: No discharge.     Conjunctiva/sclera: Conjunctivae normal.     Pupils: Pupils are equal, round, and reactive to light.  Neck:     Thyroid:  No thyromegaly.  Vascular: No JVD.     Trachea: No tracheal deviation.  Cardiovascular:     Rate and Rhythm: Normal rate and regular rhythm.     Pulses:          Dorsalis pedis pulses are 2+ on the right side and 2+ on the left side.       Posterior tibial pulses are 2+ on the right side and 2+ on the left side.     Heart sounds: Normal heart sounds. No murmur heard.   No friction rub. No gallop.  Pulmonary:     Effort: Pulmonary effort is normal. No respiratory distress.     Breath sounds: Normal breath sounds. No stridor. No wheezing or rales.  Chest:     Chest wall: No tenderness.  Abdominal:     General: Bowel sounds are normal. There is no distension.     Palpations: Abdomen is soft. There is no mass.     Tenderness: There is no abdominal tenderness. There is no guarding or rebound.  Musculoskeletal:        General: No tenderness or deformity. Normal range of motion.     Cervical back: Normal range of motion and neck supple.     Right foot: Normal range of motion. No deformity, bunion, Charcot foot, foot drop or prominent metatarsal heads.     Left foot: Normal range of motion. No deformity, bunion, Charcot foot, foot drop or prominent metatarsal heads.  Feet:     Right foot:     Protective Sensation: 6 sites tested.  6 sites sensed.     Skin integrity: Callus present. No ulcer, blister, skin breakdown, erythema, warmth, dry skin or fissure.     Toenail Condition: Right toenails are abnormally thick.     Left foot:     Protective Sensation: 6 sites tested.  6 sites sensed.     Skin integrity: Callus present. No ulcer, blister, skin breakdown, erythema, warmth, dry skin or fissure.     Toenail Condition: Left toenails are abnormally thick.  Lymphadenopathy:     Cervical: No cervical adenopathy.  Skin:    General: Skin is warm and dry.     Coloration: Skin is not pale.     Findings: No erythema or rash.  Neurological:     Mental Status: She is alert.     Cranial Nerves:  No cranial nerve deficit.     Motor: No abnormal muscle tone.     Coordination: Coordination normal.     Deep Tendon Reflexes: Reflexes are normal and symmetric.  Psychiatric:        Behavior: Behavior normal.        Thought Content: Thought content normal.        Judgment: Judgment normal.       Assessment/Plan: 1. Uncontrolled type 2 diabetes mellitus with hyperglycemia (HCC) - POCT HgB A1C - dapagliflozin propanediol (FARXIGA) 10 MG TABS tablet; Take 1 tablet (10 mg total) by mouth daily.  Dispense: 90 tablet; Refill: 1 - Accu-Chek Softclix Lancets lancets; Use as instructed. DX E11.65  Dispense: 100 each; Refill: 3 - ACCU-CHEK AVIVA PLUS test strip; Use 1 strip to check glucose level at least once daily E11.65  Dispense: 100 each; Refill: 3 - Insulin Pen Needle 31G X 5 MM MISC; Use as directed  Once a day Dx e11.65  Dispense: 100 each; Refill: 3 - insulin glargine, 2 Unit Dial, (TOUJEO MAX SOLOSTAR) 300 UNIT/ML Solostar Pen; Inject 35 Units into the skin daily.  Dispense: 12 mL; Refill: 1 - Urine Microalbumin w/creat. ratio  2. Dysuria - UA/M w/rflx Culture, Routine  3. Mixed hyperlipidemia - pravastatin (PRAVACHOL) 10 MG tablet; Take 1 tablet (10 mg total) by mouth daily.  Dispense: 90 tablet; Refill: 2  4. Non-seasonal allergic rhinitis due to other allergic trigger - cetirizine (ZYRTEC) 10 MG tablet; Take 1 tablet (10 mg total) by mouth daily.  Dispense: 90 tablet; Refill: 1  5. GAD (generalized anxiety disorder) - FLUoxetine (PROZAC) 20 MG capsule; Take 1 capsule (20 mg total) by mouth daily.  Dispense: 30 capsule; Refill: 3 - ALPRAZolam (XANAX) 0.25 MG tablet; Take one tab po bid for anxiety  Dispense: 45 tablet; Refill: 0 - FLUoxetine (PROZAC) 40 MG capsule; Take 1 capsule (40 mg total) by mouth daily.  Dispense: 90 capsule; Refill: 1  6. Dysfunctional grieving - Ambulatory referral to Psychology  7. Moderate episode of recurrent major depressive disorder ([place]) -  Ambulatory referral to Psychology - FLUoxetine (PROZAC) 20 MG capsule; Take 1 capsule (20 mg total) by mouth daily.  Dispense: 30 capsule; Refill: 3  8. Encounter for screening mammogram for malignant neoplasm of breast - MM 3D SCREEN BREAST BILATERAL; Future  9. Encounter for general adult medical examination with abnormal findings  10. Hypothyroidism, unspecified type - levothyroxine (SYNTHROID) 50 MCG tablet; Take 1 tablet (50 mcg total) by mouth daily.  Dispense: 90 tablet; Refill: 0     General Counseling: Nguyet verbalizes understanding of the findings of todays visit and agrees with plan of treatment. I have discussed any further diagnostic evaluation that may be needed or ordered today. We also reviewed her medications today. she has been encouraged to call the office with any questions or concerns that should arise related to todays visit.    Orders Placed This Encounter  Procedures   MM 3D SCREEN BREAST BILATERAL   UA/M w/rflx Culture, Routine   Urine Microalbumin w/creat. ratio   Ambulatory referral to Psychology   POCT HgB A1C    Meds ordered this encounter  Medications   FLUoxetine (PROZAC) 20 MG capsule    Sig: Take 1 capsule (20 mg total) by mouth daily.    Dispense:  30 capsule    Refill:  3    Note patient takes 40 mg dose, this prescription in in addition to the 40 mg dose so she is getting 60 mg total daily.   pravastatin (PRAVACHOL) 10 MG tablet    Sig: Take 1 tablet (10 mg total) by mouth daily.    Dispense:  90 tablet    Refill:  2   dapagliflozin propanediol (FARXIGA) 10 MG TABS tablet    Sig: Take 1 tablet (10 mg total) by mouth daily.    Dispense:  90 tablet    Refill:  1   cetirizine (ZYRTEC) 10 MG tablet    Sig: Take 1 tablet (10 mg total) by mouth daily.    Dispense:  90 tablet    Refill:  1   levothyroxine (SYNTHROID) 50 MCG tablet    Sig: Take 1 tablet (50 mcg total) by mouth daily.    Dispense:  90 tablet    Refill:  0   ALPRAZolam  (XANAX) 0.25 MG tablet    Sig: Take one tab po bid for anxiety    Dispense:  45 tablet    Refill:  0   Accu-Chek Softclix Lancets lancets    Sig: Use as instructed. DX E11.65    Dispense:  100 each  Refill:  3   ACCU-CHEK AVIVA PLUS test strip    Sig: Use 1 strip to check glucose level at least once daily E11.65    Dispense:  100 each    Refill:  3   Insulin Pen Needle 31G X 5 MM MISC    Sig: Use as directed  Once a day Dx e11.65    Dispense:  100 each    Refill:  3   FLUoxetine (PROZAC) 40 MG capsule    Sig: Take 1 capsule (40 mg total) by mouth daily.    Dispense:  90 capsule    Refill:  1   insulin glargine, 2 Unit Dial, (TOUJEO MAX SOLOSTAR) 300 UNIT/ML Solostar Pen    Sig: Inject 35 Units into the skin daily.    Dispense:  12 mL    Refill:  1    Return in about 3 months (around 04/29/2022) for F/U, Recheck A1C, Andersson Larrabee PCP and patient will call if need appt sooner r/t depression. .   Total time spent:*** Minutes Time spent includes review of chart, medications, test results, and follow up plan with the patient.   [place] Controlled Substance Database was reviewed by me.  This patient was seen by Jonetta Osgood, FNP-C in collaboration with Dr. Clayborn Bigness as a part of collaborative care agreement.  Louretta Tantillo R. Valetta Fuller, MSN, FNP-C Internal medicine

## 2022-01-31 LAB — UA/M W/RFLX CULTURE, ROUTINE
Bilirubin, UA: NEGATIVE
Ketones, UA: NEGATIVE
Leukocytes,UA: NEGATIVE
Nitrite, UA: NEGATIVE
Protein,UA: NEGATIVE
Specific Gravity, UA: 1.023 (ref 1.005–1.030)
Urobilinogen, Ur: 1 mg/dL (ref 0.2–1.0)
pH, UA: 6 (ref 5.0–7.5)

## 2022-01-31 LAB — MICROSCOPIC EXAMINATION
Bacteria, UA: NONE SEEN
Casts: NONE SEEN /lpf
WBC, UA: NONE SEEN /hpf (ref 0–5)

## 2022-01-31 LAB — MICROALBUMIN / CREATININE URINE RATIO
Creatinine, Urine: 49.8 mg/dL
Microalb/Creat Ratio: <6 mg/g creat (ref 0–29)
Microalbumin, Urine: <3 ug/mL

## 2022-02-06 DIAGNOSIS — M545 Low back pain, unspecified: Secondary | ICD-10-CM | POA: Diagnosis not present

## 2022-02-06 DIAGNOSIS — Z79899 Other long term (current) drug therapy: Secondary | ICD-10-CM | POA: Diagnosis not present

## 2022-02-06 DIAGNOSIS — M25562 Pain in left knee: Secondary | ICD-10-CM | POA: Diagnosis not present

## 2022-02-06 DIAGNOSIS — G8929 Other chronic pain: Secondary | ICD-10-CM | POA: Diagnosis not present

## 2022-02-06 DIAGNOSIS — M62838 Other muscle spasm: Secondary | ICD-10-CM | POA: Diagnosis not present

## 2022-02-23 ENCOUNTER — Encounter: Payer: Self-pay | Admitting: Physician Assistant

## 2022-02-24 DIAGNOSIS — G4733 Obstructive sleep apnea (adult) (pediatric): Secondary | ICD-10-CM | POA: Diagnosis not present

## 2022-02-25 ENCOUNTER — Other Ambulatory Visit: Payer: Self-pay | Admitting: Nurse Practitioner

## 2022-02-25 DIAGNOSIS — F331 Major depressive disorder, recurrent, moderate: Secondary | ICD-10-CM

## 2022-02-25 DIAGNOSIS — F411 Generalized anxiety disorder: Secondary | ICD-10-CM

## 2022-03-06 ENCOUNTER — Encounter: Payer: Self-pay | Admitting: Nurse Practitioner

## 2022-03-09 DIAGNOSIS — M62838 Other muscle spasm: Secondary | ICD-10-CM | POA: Diagnosis not present

## 2022-03-09 DIAGNOSIS — M25562 Pain in left knee: Secondary | ICD-10-CM | POA: Diagnosis not present

## 2022-03-09 DIAGNOSIS — G8929 Other chronic pain: Secondary | ICD-10-CM | POA: Diagnosis not present

## 2022-03-09 DIAGNOSIS — Z79899 Other long term (current) drug therapy: Secondary | ICD-10-CM | POA: Diagnosis not present

## 2022-03-09 DIAGNOSIS — M545 Low back pain, unspecified: Secondary | ICD-10-CM | POA: Diagnosis not present

## 2022-03-20 DIAGNOSIS — G4733 Obstructive sleep apnea (adult) (pediatric): Secondary | ICD-10-CM | POA: Diagnosis not present

## 2022-03-23 ENCOUNTER — Ambulatory Visit (INDEPENDENT_AMBULATORY_CARE_PROVIDER_SITE_OTHER): Payer: Medicare Other | Admitting: Psychologist

## 2022-03-23 DIAGNOSIS — Z634 Disappearance and death of family member: Secondary | ICD-10-CM

## 2022-03-23 DIAGNOSIS — F331 Major depressive disorder, recurrent, moderate: Secondary | ICD-10-CM

## 2022-03-23 NOTE — Progress Notes (Signed)
[facility] Counselor Initial Adult Exam ? ?Name: [name] [name] ?Date: 03/23/2022 ?MRN: [id number] ?DOB: Jan 01, 1976 ?PCP: Jonetta Osgood, NP ? ?Time spent: 3:03 pm to 3:32 pm; total time: 29 minutes ? ?This session was held via video webex teletherapy due to the coronavirus risk at this time. The patient consented to video teletherapy and was located at her home during this session. She is aware it is the responsibility of the patient to secure confidentiality on her end of the session. The provider was in a private home office for the duration of this session. Limits of confidentiality were discussed with the patient.  ? ? ?Guardian/Payee:  NA   ? ?Paperwork requested: No  ? ?Reason for Visit /Presenting Problem: Grief ? ?Mental Status Exam: ?Appearance:   Casual     ?Behavior:  Appropriate  ?Motor:  Normal  ?Speech/Language:   Clear and Coherent  ?Affect:  Appropriate  ?Mood:  normal  ?Thought process:  normal  ?Thought content:    WNL  ?Sensory/Perceptual disturbances:    WNL  ?Orientation:  oriented to person, place, and time/date  ?Attention:  Good  ?Concentration:  Good  ?Memory:  WNL  ?Fund of knowledge:   Good  ?Insight:    Fair  ?Judgment:   Good  ?Impulse Control:  Good  ? ? ?Reported Symptoms:  The patient endorsed experiencing the following: feeling down, sad, tearful, rumination of negative thoughts, social isolation, avoiding pleasurable experiences, thoughts of hopelessness, fatigue, and lack of motivation. She denied suicidal and homicidal ideation.  ? ?Risk Assessment: ?Danger to Self:  No ?Self-injurious Behavior: No ?Danger to Others: No ?Duty to Warn:no ?Physical Aggression / Violence:No  ?Access to Firearms a concern: No  ?Gang Involvement:No  ?Patient / guardian was educated about steps to take if suicide or homicide risk level increases between visits: n/a ?While future psychiatric events cannot be accurately predicted, the patient does not currently require acute  inpatient psychiatric care and does not currently meet [facility] involuntary commitment criteria. ? ?Substance Abuse History: ?Current substance abuse: No    ? ?Past Psychiatric History:   ?Previous psychological history is significant for anxiety and depression ?Outpatient Providers:NA ?History of Psych Hospitalization: No  ?Psychological Testing:  NA   ? ?Abuse History:  ?Victim of: Yes.  , emotional   ?Report needed: No. ?Victim of Neglect:No. ?Perpetrator of  NA   ?Witness / Exposure to Domestic Violence: No   ?Protective Services Involvement: No  ?Witness to [employer] Violence:  No  ? ?Family History:  ?Family History  ?Problem Relation Age of Onset  ? Cancer Paternal Aunt   ? Cancer Paternal Uncle   ? Diabetes Paternal Grandfather   ? Heart disease Paternal Grandfather   ? ? ?Living situation: the patient lives with their family ? ?Sexual Orientation: Straight ? ?Relationship Status: divorced. Patient was married twice previously. Patient's fiance (DJ) died in a work related accident.  ?Name of spouse / other:NA ?If a parent, number of children / ages: Patient has a son and a daughter ? ?Support Systems: Family ? ?Financial Stress:  No  ? ?Income/Employment/Disability: Social Security Disability ? ?Military Service: No  ? ?Educational History: ?Education: [employer] ? ?Religion/Sprituality/World View: ?Christian ? ?Any cultural differences that may affect / interfere with treatment:  not applicable  ? ?Recreation/Hobbies: Being at the beach ? ?Stressors: Other: Grief   ? ?Strengths: Supportive Relationships ? ?Barriers:  NA  ? ?Legal History: ?Pending legal issue / charges: The patient has no significant  history of legal issues. ?History of legal issue / charges:  NA ? ?Medical History/Surgical History: reviewed ?Past Medical History:  ?Diagnosis Date  ? Actinic keratitis   ? Allergy   ? Anxiety   ? Asthma   ? Basal cell carcinoma   ? Chronic venous insufficiency   ? DDD (degenerative disc  disease), lumbar   ? Diabetes mellitus without complication ([place])   ? Fibromyalgia   ? Fibromyalgia   ? Hyperlipidemia   ? Lymphedema   ? Melanoma ([place])   ? Migraine   ? Mitral valve prolapse   ? Tendonitis of foot   ? left  ? ? ?Past Surgical History:  ?Procedure Laterality Date  ? ABDOMINAL HYSTERECTOMY    ? CARPAL TUNNEL RELEASE    ? FOOT TENDON SURGERY Right   ? KNEE SURGERY    ? SPINAL FUSION    ? SPINAL FUSION    ? ? ?Medications: ?Current Outpatient Medications  ?Medication Sig Dispense Refill  ? ACCU-CHEK AVIVA PLUS test strip Use 1 strip to check glucose level at least once daily E11.65 100 each 3  ? Accu-Chek Softclix Lancets lancets USE AS INSTRUCTED to check blood sugars once a day 100 each 3  ? albuterol (VENTOLIN HFA) 108 (90 Base) MCG/ACT inhaler Inhale 1-2 puffs into the lungs every 6 (six) hours as needed for wheezing or shortness of breath. 18 g 1  ? ALPRAZolam (XANAX) 0.25 MG tablet Take one tab po bid for anxiety 45 tablet 0  ? ammonium lactate (LAC-HYDRIN) 12 % lotion Apply 1 application topically as needed for dry skin. 400 g 0  ? benzonatate (TESSALON) 100 MG capsule Take 1 capsule (100 mg total) by mouth 2 (two) times daily as needed for cough. 30 capsule 0  ? cetirizine (ZYRTEC) 10 MG tablet Take 1 tablet (10 mg total) by mouth daily. 90 tablet 1  ? Cholecalciferol 25 MCG (1000 UT) tablet Take 1 tablet (1,000 Units total) by mouth once a week. 15 tablet 0  ? dapagliflozin propanediol (FARXIGA) 10 MG TABS tablet Take 1 tablet (10 mg total) by mouth daily. 90 tablet 1  ? diclofenac Sodium (VOLTAREN) 1 % GEL SMARTSIG:2-4 Gram(s) Topical 1-4 Times Daily PRN    ? fenofibrate (TRICOR) 145 MG tablet Take by mouth.    ? FLUoxetine (PROZAC) 20 MG capsule TAKE 1 CAPSULE BY MOUTH EVERY DAY 90 capsule 2  ? FLUoxetine (PROZAC) 40 MG capsule Take 1 capsule (40 mg total) by mouth daily. 90 capsule 1  ? fluticasone (FLONASE) 50 MCG/ACT nasal spray Place 2 sprays into both nostrils daily.    ? HYDROmorphone  (DILAUDID) 4 MG tablet Take by mouth every 6 (six) hours as needed for severe pain.    ? hydrOXYzine (VISTARIL) 25 MG capsule Take 1 capsule (25 mg total) by mouth every 8 (eight) hours as needed. 30 capsule 0  ? ibuprofen (ADVIL) 800 MG tablet Take 1 tablet (800 mg total) by mouth every 6 (six) hours as needed. 60 tablet 1  ? insulin glargine, 2 Unit Dial, (TOUJEO MAX SOLOSTAR) 300 UNIT/ML Solostar Pen Inject 35 Units into the skin daily. 12 mL 1  ? Insulin Pen Needle 31G X 5 MM MISC Use as directed  Once a day Dx e11.65 100 each 3  ? Ipratropium-Albuterol (COMBIVENT RESPIMAT) 20-100 MCG/ACT AERS respimat Inhale 1 puff into the lungs every 6 (six) hours as needed for wheezing. 4 g 1  ? ipratropium-albuterol (DUONEB) 0.5-2.5 (3) MG/3ML SOLN Take 3  mLs by nebulization every 4 (four) hours as needed (wheezing,SOB and cough). 360 mL 2  ? levothyroxine (SYNTHROID) 50 MCG tablet Take 1 tablet (50 mcg total) by mouth daily. 90 tablet 0  ? methocarbamol (ROBAXIN) 500 MG tablet PLEASE SEE ATTACHED FOR DETAILED DIRECTIONS    ? morphine (MS CONTIN) 60 MG 12 hr tablet Take 60 mg by mouth every 12 (twelve) hours.    ? Multiple Vitamin (MULTI-VITAMIN) tablet Take 1 tablet by mouth daily.    ? pantoprazole (PROTONIX) 40 MG tablet TAKE 1 TABLET BY MOUTH EVERY DAY 90 tablet 1  ? pravastatin (PRAVACHOL) 10 MG tablet Take 1 tablet (10 mg total) by mouth daily. 90 tablet 2  ? pregabalin (LYRICA) 150 MG capsule Take 150 mg by mouth 3 (three) times daily.     ? promethazine (PHENERGAN) 25 MG tablet Take 1 tablet (25 mg total) by mouth every 6 (six) hours as needed for nausea or vomiting. 30 tablet 0  ? Semaglutide,0.25 or 0.'5MG'$ /DOS, 2 MG/1.5ML SOPN Inject 0.25 mg into the skin once a week. For the first 4 weeks. Then increase to 0.5 mg once a week. 3 mL 2  ? SUMAtriptan (IMITREX) 100 MG tablet Take 100 mg by mouth every 2 (two) hours as needed for migraine. May repeat in 2 hours if headache persists or recurs.    ? triamcinolone  ointment (KENALOG) 0.1 % Apply to affected area daily for two weeks    ? Vitamin D, Ergocalciferol, (DRISDOL) 1.25 MG (50000 UNIT) CAPS capsule Take 1 capsule (50,000 Units total) by mouth every 7 (seven) days. 15 capsule

## 2022-03-23 NOTE — Progress Notes (Signed)
                Jeremey Bascom, PsyD 

## 2022-03-23 NOTE — Plan of Care (Signed)
Goals Begin a healthy grieving process Objectives Tell in detail the story of the current loss that is triggering symptoms Read books on the topic of grief Watch videos on the theme of grief Begin verbalizing feelings associated with the loss Attend a grief support group express thoughts and feelings about the deceased Identify and voice positives about the deceased implement acts of spiritual faith  Interventions create a safe environment and actively build trust use empathy, compassion, and support ask the patient to write a letter to the lost person conduct empty chair ask the patient to discuss and list the positives and negative aspects of the person encourage patient to rely upon his/her spiritual faith  ask client to read books on grief ask patient to watch videos about grief assist patient in identifying emotions  ask patient to attend support group   

## 2022-03-27 ENCOUNTER — Ambulatory Visit (INDEPENDENT_AMBULATORY_CARE_PROVIDER_SITE_OTHER): Payer: Medicare Other | Admitting: Psychologist

## 2022-03-27 DIAGNOSIS — F331 Major depressive disorder, recurrent, moderate: Secondary | ICD-10-CM

## 2022-03-27 DIAGNOSIS — Z634 Disappearance and death of family member: Secondary | ICD-10-CM | POA: Diagnosis not present

## 2022-03-27 NOTE — Progress Notes (Signed)
                Naszir Cott, PsyD 

## 2022-03-27 NOTE — Progress Notes (Signed)
[facility] Counselor/Therapist Progress Note ? ?Patient ID: [name] [name], MRN: [id number],   ? ?Date: 03/27/2022 ? ?Time Spent: 11:05 am to 11:43 am; total time: 38 minutes ? ? This session was held via video webex teletherapy due to the coronavirus risk at this time. The patient consented to video teletherapy and was located at her home during this session. She is aware it is the responsibility of the patient to secure confidentiality on her end of the session. The provider was in a private home office for the duration of this session. Limits of confidentiality were discussed with the patient.  ? ?Treatment Type: Individual Therapy ? ?Reported Symptoms: Grief ? ?Mental Status Exam: ?Appearance:  Casual     ?Behavior: Appropriate  ?Motor: Normal  ?Speech/Language:  Clear and Coherent  ?Affect: Appropriate  ?Mood: normal  ?Thought process: normal  ?Thought content:   WNL  ?Sensory/Perceptual disturbances:   WNL  ?Orientation: oriented to person, place, and time/date  ?Attention: Good  ?Concentration: Good  ?Memory: WNL  ?Fund of knowledge:  Fair  ?Insight:   Fair  ?Judgment:  Fair  ?Impulse Control: Good  ? ?Risk Assessment: ?Danger to Self:  No ?Self-injurious Behavior: No ?Danger to Others: No ?Duty to Warn:no ?Physical Aggression / Violence:No  ?Access to Firearms a concern: No  ?Gang Involvement:No  ? ?Subjective: Beginning the session, patient described herself as okay while reflecting on events since the intake. After reviewing the treatment plan, patient spent the session processing the grief she is experiencing related to the death of [name] [name]. She participated in a life review of his life. She also reflected on the idea of writing letters to him and writing a letter to grief. Patient also voiced being open to [name] [name] and [name] [name]. She was agreeable to homework and following up. She denied suicidal and homicidal ideation.   ? ?Interventions:  Worked on developing a therapeutic relationship  with the patient using active listening and reflective statements. Provided emotional support using empathy and validation. Reviewed the treatment plan with the patient. Reviewed events since the intake occurred. Identified goals for the session. Assisted the patient in doing a life review of [name] [name]. Validated expressed emotions. Used socratic questions to assist the patient gain insight into self. Explored ways that the patient could honor [name] [name]. Reflected on the idea of still communicating with [name] [name]. Assisted in problem solving. Provided psychoeducation about grief, [name] [name] and [name] [name]. Identified tasks to complete for homework assignments. Provided empathic statements. Assigned homework. Assessed for suicidal and homicidal ideation.  ? ?Homework: Write letters to [name] [name], write letter to grief, watch [name] [name], and read [name] [name] ? ?Next Session: Review homework and emotional support.  ? ?Diagnosis: F33.1 major depressive affective disorder, recurrent, moderate and K09.3 uncomplicated bereavement.  ? ?Plan:  ? ?Goals ?Begin a healthy grieving process ? ? ?Objectives target date for all objectives is 03/24/2023 ?Tell in detail the story of the current loss that is triggering symptoms ?Read books on the topic of grief ?Watch videos on the theme of grief ?Begin verbalizing feelings associated with the loss ?Attend a grief support group ?express thoughts and feelings about the deceased ?Identify and voice positives about the deceased ?implement acts of spiritual faith  ? ? ?Interventions ?create a safe environment and actively build trust ?use empathy, compassion, and support ?ask the patient to write a letter to the lost person ?conduct empty chair ?ask the patient to discuss and list the positives and negative aspects of the  person ?encourage patient to rely upon his/her spiritual faith  ?ask client to read books on grief ?ask patient to watch videos about grief ?assist patient in identifying emotions  ?ask patient to attend  support group  ? ?The patient and clinician reviewed the treatment plan on 03/27/2022. The patient approved of the treatment plan.  ? ?[name] Chancy, PsyD ? ? ? ?

## 2022-04-07 DIAGNOSIS — M62838 Other muscle spasm: Secondary | ICD-10-CM | POA: Diagnosis not present

## 2022-04-07 DIAGNOSIS — M25562 Pain in left knee: Secondary | ICD-10-CM | POA: Diagnosis not present

## 2022-04-07 DIAGNOSIS — G8929 Other chronic pain: Secondary | ICD-10-CM | POA: Diagnosis not present

## 2022-04-07 DIAGNOSIS — Z79899 Other long term (current) drug therapy: Secondary | ICD-10-CM | POA: Diagnosis not present

## 2022-04-07 DIAGNOSIS — M545 Low back pain, unspecified: Secondary | ICD-10-CM | POA: Diagnosis not present

## 2022-04-07 DIAGNOSIS — M1712 Unilateral primary osteoarthritis, left knee: Secondary | ICD-10-CM | POA: Diagnosis not present

## 2022-04-21 ENCOUNTER — Encounter: Payer: Self-pay | Admitting: Nurse Practitioner

## 2022-04-21 ENCOUNTER — Ambulatory Visit (INDEPENDENT_AMBULATORY_CARE_PROVIDER_SITE_OTHER): Payer: Medicare Other | Admitting: Nurse Practitioner

## 2022-04-21 VITALS — BP 130/89 | HR 88 | Temp 98.5°F | Resp 16 | Ht 61.0 in | Wt 156.6 lb

## 2022-04-21 DIAGNOSIS — B372 Candidiasis of skin and nail: Secondary | ICD-10-CM | POA: Diagnosis not present

## 2022-04-21 DIAGNOSIS — R300 Dysuria: Secondary | ICD-10-CM

## 2022-04-21 DIAGNOSIS — B3731 Acute candidiasis of vulva and vagina: Secondary | ICD-10-CM

## 2022-04-21 DIAGNOSIS — F411 Generalized anxiety disorder: Secondary | ICD-10-CM

## 2022-04-21 DIAGNOSIS — N3000 Acute cystitis without hematuria: Secondary | ICD-10-CM | POA: Diagnosis not present

## 2022-04-21 LAB — POCT URINALYSIS DIPSTICK
Bilirubin, UA: NEGATIVE
Glucose, UA: POSITIVE — AB
Leukocytes, UA: NEGATIVE
Nitrite, UA: NEGATIVE
Protein, UA: NEGATIVE
Spec Grav, UA: 1.01 (ref 1.010–1.025)
Urobilinogen, UA: 0.2 E.U./dL
pH, UA: 7 (ref 5.0–8.0)

## 2022-04-21 MED ORDER — NYSTATIN 100000 UNIT/GM EX POWD: 1.0000 "application " | Freq: Three times a day (TID) | CUTANEOUS | 5 refills | Status: DC

## 2022-04-21 MED ORDER — NITROFURANTOIN MONOHYD MACRO 100 MG PO CAPS: 100.0000 mg | ORAL_CAPSULE | Freq: Two times a day (BID) | ORAL | 0 refills | Status: AC

## 2022-04-21 MED ORDER — ALPRAZOLAM 0.25 MG PO TABS: ORAL_TABLET | ORAL | 0 refills | Status: DC

## 2022-04-21 MED ORDER — FLUCONAZOLE 150 MG PO TABS: 150.0000 mg | ORAL_TABLET | Freq: Once | ORAL | 0 refills | Status: AC

## 2022-04-21 NOTE — Progress Notes (Signed)
[facility] [place], [place] [address]  Internal MEDICINE  Office Visit Note  Patient Name: [name] [name]  [id number]  [id number]  Date of Service: 04/21/2022  Chief Complaint  Patient presents with   Acute Visit   Urinary Tract Infection    Urgency, difficulty starting stream and voiding very little, pelvic pain or pressure, some burning, lower back pain, frequency, doesn't feel like she is emptying bladder completely, body aches, urine has odor      HPI [name] [name] presents for an acute sick visit for symptoms of a urinary tract infection.  Patient reports that she noticed that she was developing urinary urgency, hesitancy, urinary frequency, inability to empty bladder completely, burning with urination, urinary discomfort, pelvic pains, suprapubic pressure and tenderness.  She also reports having an odor to her urine, body aches and low back pain and decreased urine.  She had a urinalysis done in the office today and it was negative except for a small amount of blood. She is also requesting that her alprazolam prescription be refilled today. She also is requesting to get nystatin powder for a recurrent rash that she has under her breast especially during the summer months as well as on her lower abdomen at times Patient is also reporting increase symptoms of allergic rhinitis related to seasonal allergies/environmental allergies.  Patient is taking a maintenance daily allergy medication orally.  She does have a prescription for Flonase but is not using it every day.    Current Medication:  Outpatient Encounter Medications as of 04/21/2022  Medication Sig   ACCU-CHEK AVIVA PLUS test strip Use 1 strip to check glucose level at least once daily E11.65   Accu-Chek Softclix Lancets lancets USE AS INSTRUCTED to check blood sugars once a day   albuterol (VENTOLIN HFA) 108 (90 Base) MCG/ACT inhaler Inhale 1-2 puffs into the lungs every 6 (six) hours as needed for  wheezing or shortness of breath.   ammonium lactate (LAC-HYDRIN) 12 % lotion Apply 1 application topically as needed for dry skin.   benzonatate (TESSALON) 100 MG capsule Take 1 capsule (100 mg total) by mouth 2 (two) times daily as needed for cough.   cetirizine (ZYRTEC) 10 MG tablet Take 1 tablet (10 mg total) by mouth daily.   Cholecalciferol 25 MCG (1000 UT) tablet Take 1 tablet (1,000 Units total) by mouth once a week.   dapagliflozin propanediol (FARXIGA) 10 MG TABS tablet Take 1 tablet (10 mg total) by mouth daily.   diclofenac Sodium (VOLTAREN) 1 % GEL SMARTSIG:2-4 Gram(s) Topical 1-4 Times Daily PRN   fenofibrate (TRICOR) 145 MG tablet Take by mouth.   [EXPIRED] fluconazole (DIFLUCAN) 150 MG tablet Take 1 tablet (150 mg total) by mouth once for 1 dose. May take an additional dose after 3 days if still symptomatic.   FLUoxetine (PROZAC) 20 MG capsule TAKE 1 CAPSULE BY MOUTH EVERY DAY   FLUoxetine (PROZAC) 40 MG capsule Take 1 capsule (40 mg total) by mouth daily.   fluticasone (FLONASE) 50 MCG/ACT nasal spray Place 2 sprays into both nostrils daily.   HYDROmorphone (DILAUDID) 4 MG tablet Take by mouth every 6 (six) hours as needed for severe pain.   hydrOXYzine (VISTARIL) 25 MG capsule Take 1 capsule (25 mg total) by mouth every 8 (eight) hours as needed.   ibuprofen (ADVIL) 800 MG tablet Take 1 tablet (800 mg total) by mouth every 6 (six) hours as needed.   insulin glargine, 2 Unit Dial, (TOUJEO MAX SOLOSTAR) 300  UNIT/ML Solostar Pen Inject 35 Units into the skin daily.   Insulin Pen Needle 31G X 5 MM MISC Use as directed  Once a day Dx e11.65   Ipratropium-Albuterol (COMBIVENT RESPIMAT) 20-100 MCG/ACT AERS respimat Inhale 1 puff into the lungs every 6 (six) hours as needed for wheezing.   ipratropium-albuterol (DUONEB) 0.5-2.5 (3) MG/3ML SOLN Take 3 mLs by nebulization every 4 (four) hours as needed (wheezing,SOB and cough).   levothyroxine (SYNTHROID) 50 MCG tablet Take 1 tablet (50 mcg  total) by mouth daily.   methocarbamol (ROBAXIN) 500 MG tablet PLEASE SEE ATTACHED FOR DETAILED DIRECTIONS   morphine (MS CONTIN) 60 MG 12 hr tablet Take 60 mg by mouth every 12 (twelve) hours.   Multiple Vitamin (MULTI-VITAMIN) tablet Take 1 tablet by mouth daily.   nitrofurantoin, macrocrystal-monohydrate, (MACROBID) 100 MG capsule Take 1 capsule (100 mg total) by mouth 2 (two) times daily for 7 days.   nystatin (MYCOSTATIN/NYSTOP) powder Apply 1 application. topically 3 (three) times daily.   pantoprazole (PROTONIX) 40 MG tablet TAKE 1 TABLET BY MOUTH EVERY DAY   pravastatin (PRAVACHOL) 10 MG tablet Take 1 tablet (10 mg total) by mouth daily.   pregabalin (LYRICA) 150 MG capsule Take 150 mg by mouth 3 (three) times daily.    promethazine (PHENERGAN) 25 MG tablet Take 1 tablet (25 mg total) by mouth every 6 (six) hours as needed for nausea or vomiting.   Semaglutide,0.25 or 0.'5MG'$ /DOS, 2 MG/1.5ML SOPN Inject 0.25 mg into the skin once a week. For the first 4 weeks. Then increase to 0.5 mg once a week.   SUMAtriptan (IMITREX) 100 MG tablet Take 100 mg by mouth every 2 (two) hours as needed for migraine. May repeat in 2 hours if headache persists or recurs.   triamcinolone ointment (KENALOG) 0.1 % Apply to affected area daily for two weeks   Vitamin D, Ergocalciferol, (DRISDOL) 1.25 MG (50000 UNIT) CAPS capsule Take 1 capsule (50,000 Units total) by mouth every 7 (seven) days.   [DISCONTINUED] ALPRAZolam (XANAX) 0.25 MG tablet Take one tab po bid for anxiety   ALPRAZolam (XANAX) 0.25 MG tablet Take one tab po bid prn for anxiety   No facility-administered encounter medications on file as of 04/21/2022.      Medical History: Past Medical History:  Diagnosis Date   Actinic keratitis    Allergy    Anxiety    Asthma    Basal cell carcinoma    Chronic venous insufficiency    DDD (degenerative disc disease), lumbar    Diabetes mellitus without complication ([facility])    Fibromyalgia     Fibromyalgia    Hyperlipidemia    Lymphedema    Melanoma ([place])    Migraine    Mitral valve prolapse    Tendonitis of foot    left     Vital Signs: BP 130/89   Pulse 88   Temp 98.5 F (36.9 C)   Resp 16   Ht '5\' 1"'$  (1.549 m)   Wt 156 lb 9.6 oz (71 kg)   LMP 12/13/1991   SpO2 97%   BMI 29.59 kg/m    Review of Systems  Constitutional:  Negative for chills, fatigue and unexpected weight change.  HENT:  Positive for postnasal drip, sinus pressure and sinus pain. Negative for congestion, rhinorrhea, sneezing and sore throat.   Eyes:  Negative for redness.  Respiratory: Negative.  Negative for cough, chest tightness, shortness of breath and wheezing.   Cardiovascular: Negative.  Negative for chest  pain and palpitations.  Gastrointestinal:  Negative for abdominal pain, constipation, diarrhea, nausea and vomiting.  Genitourinary:  Positive for decreased urine volume, difficulty urinating, dysuria, frequency, pelvic pain and urgency.  Musculoskeletal:  Positive for back pain. Negative for arthralgias, joint swelling and neck pain.  Skin:  Negative for rash.  Neurological:  Negative for tremors and numbness.  Hematological:  Negative for adenopathy. Does not bruise/bleed easily.  Psychiatric/Behavioral:  Negative for behavioral problems (Depression), sleep disturbance and suicidal ideas. The patient is not nervous/anxious.    Physical Exam Vitals reviewed.  Constitutional:      Appearance: Normal appearance.  HENT:     Head: Normocephalic and atraumatic.  Eyes:     Pupils: Pupils are equal, round, and reactive to light.  Cardiovascular:     Rate and Rhythm: Normal rate and regular rhythm.  Pulmonary:     Effort: Pulmonary effort is normal. No respiratory distress.  Abdominal:     Tenderness: There is abdominal tenderness in the suprapubic area.  Neurological:     Mental Status: She is alert and oriented to person, place, and time.  Psychiatric:        Mood and Affect: Mood  normal.        Behavior: Behavior normal.      Assessment/Plan: 1. Acute cystitis without hematuria Empiric antibiotic treatment prescribed.  Urine culture sent. - nitrofurantoin, macrocrystal-monohydrate, (MACROBID) 100 MG capsule; Take 1 capsule (100 mg total) by mouth 2 (two) times daily for 7 days.  Dispense: 14 capsule; Refill: 0  2. Vulvovaginal candidiasis Fluconazole prescribed for prophylaxis of yeast infection in genital area Due to antibiotic use.  - fluconazole (DIFLUCAN) 150 MG tablet; Take 1 tablet (150 mg total) by mouth once for 1 dose. May take an additional dose after 3 days if still symptomatic.  Dispense: 3 tablet; Refill: 0  3. Candidal intertrigo Nystatin powder ordered with refills. - nystatin (MYCOSTATIN/NYSTOP) powder; Apply 1 application. topically 3 (three) times daily.  Dispense: 60 g; Refill: 5  4. Dysuria Urinalysis was positive for small amount of blood, urine culture sent. - POCT Urinalysis Dipstick - CULTURE, URINE COMPREHENSIVE  5. GAD (generalized anxiety disorder) Stable, takes alprazolam as needed, refills ordered - ALPRAZolam (XANAX) 0.25 MG tablet; Take one tab po bid prn for anxiety  Dispense: 45 tablet; Refill: 0   General Counseling: Madyson verbalizes understanding of the findings of todays visit and agrees with plan of treatment. I have discussed any further diagnostic evaluation that may be needed or ordered today. We also reviewed her medications today. she has been encouraged to call the office with any questions or concerns that should arise related to todays visit.    Counseling:    Orders Placed This Encounter  Procedures   CULTURE, URINE COMPREHENSIVE   POCT Urinalysis Dipstick    Meds ordered this encounter  Medications   fluconazole (DIFLUCAN) 150 MG tablet    Sig: Take 1 tablet (150 mg total) by mouth once for 1 dose. May take an additional dose after 3 days if still symptomatic.    Dispense:  3 tablet    Refill:  0    nitrofurantoin, macrocrystal-monohydrate, (MACROBID) 100 MG capsule    Sig: Take 1 capsule (100 mg total) by mouth 2 (two) times daily for 7 days.    Dispense:  14 capsule    Refill:  0   ALPRAZolam (XANAX) 0.25 MG tablet    Sig: Take one tab po bid prn for anxiety  Dispense:  45 tablet    Refill:  0   nystatin (MYCOSTATIN/NYSTOP) powder    Sig: Apply 1 application. topically 3 (three) times daily.    Dispense:  60 g    Refill:  5    Return if symptoms worsen or fail to improve, for and for next routine follow up visit. .  [place] Controlled Substance Database was reviewed by me for overdose risk score (ORS)  Time spent:30 Minutes Time spent with patient included reviewing progress notes, labs, imaging studies, and discussing plan for follow up.   This patient was seen by Jonetta Osgood, FNP-C in collaboration with Dr. Clayborn Bigness as a part of collaborative care agreement.  Mahitha Hickling R. Valetta Fuller, MSN, FNP-C Internal Medicine

## 2022-04-22 ENCOUNTER — Encounter: Payer: Self-pay | Admitting: Nurse Practitioner

## 2022-04-22 ENCOUNTER — Other Ambulatory Visit: Payer: Self-pay | Admitting: Nurse Practitioner

## 2022-04-22 DIAGNOSIS — E039 Hypothyroidism, unspecified: Secondary | ICD-10-CM

## 2022-04-25 LAB — CULTURE, URINE COMPREHENSIVE

## 2022-04-26 ENCOUNTER — Ambulatory Visit (INDEPENDENT_AMBULATORY_CARE_PROVIDER_SITE_OTHER): Payer: Medicare Other | Admitting: Psychologist

## 2022-04-26 DIAGNOSIS — Z634 Disappearance and death of family member: Secondary | ICD-10-CM

## 2022-04-26 DIAGNOSIS — F331 Major depressive disorder, recurrent, moderate: Secondary | ICD-10-CM | POA: Diagnosis not present

## 2022-04-26 NOTE — Progress Notes (Signed)
[facility] Counselor/Therapist Progress Note ? ?Patient ID: [name] [name], MRN: [id number],   ? ?Date: 04/26/2022 ? ?Time Spent: 11:04 am to 11:46 am; total time: 42 minutes ? ? This session was held via video webex teletherapy due to the coronavirus risk at this time. The patient consented to video teletherapy and was located at her home during this session. She is aware it is the responsibility of the patient to secure confidentiality on her end of the session. The provider was in a private home office for the duration of this session. Limits of confidentiality were discussed with the patient.  ? ?Treatment Type: Individual Therapy ? ?Reported Symptoms: Grief ? ?Mental Status Exam: ?Appearance:  Casual     ?Behavior: Appropriate  ?Motor: Normal  ?Speech/Language:  Clear and Coherent  ?Affect: Appropriate  ?Mood: normal  ?Thought process: normal  ?Thought content:   WNL  ?Sensory/Perceptual disturbances:   WNL  ?Orientation: oriented to person, place, and time/date  ?Attention: Good  ?Concentration: Good  ?Memory: WNL  ?Fund of knowledge:  Fair  ?Insight:   Fair  ?Judgment:  Fair  ?Impulse Control: Good  ? ?Risk Assessment: ?Danger to Self:  No ?Self-injurious Behavior: No ?Danger to Others: No ?Duty to Warn:no ?Physical Aggression / Violence:No  ?Access to Firearms a concern: No  ?Gang Involvement:No  ? ?Subjective: Beginning the session, patient described herself as doing better and reflected on what has assisted her. Elaborating, she stated that moving forward from denial to acceptance will help her in the grieving process. She explored what this looks like to her. She also talked about how she relies on her supernatural beliefs and how they help her in the process of grieving. She reflected on the idea of dating again and processed what would help facilitate her feeling comfortable with dating. She ended the session voicing that she wants to help others who are grieving and voiced interest  in attending a grief support group. She also disclosed that she is expecting a new grandchild. She was agreeable to homework and following up. She denied suicidal and homicidal ideation.   ? ?Interventions:  Worked on developing a therapeutic relationship with the patient using active listening and reflective statements. Provided emotional support using empathy and validation. Used summary statements. Praised the patient for doing well and explored what has assisted the patient. Reviewed the completed homework assignment. Normalized and validated patient's thoughts about the book. Used socratic questions to assist the patient gain insight into self. Processed the themes of acceptance and denial. Assisted in problem solving regarding moving towards acceptance. Processed the idea of dating. Reflected on the idea of writing a letter to DJ asking for permission to date. Processed thoughts and emotions. Challenged some of the thoughts expressed by the patient. Identified supporting others. Processed different ways that the patient could support others in grief. Facilitated looking for a support group to assist the patient. Assigned homework. Assessed for suicidal and homicidal ideation.  ? ?Homework: Write letter to DJ asking for permission to date, look into support groups.  ? ?Next Session: Review homework and emotional support.  ? ?Diagnosis: F33.1 major depressive affective disorder, recurrent, moderate and J67.3 uncomplicated bereavement.  ? ?Plan:  ? ?Goals ?Begin a healthy grieving process ? ? ?Objectives target date for all objectives is 03/24/2023 ?Tell in detail the story of the current loss that is triggering symptoms ?Read books on the topic of grief ?Watch videos on the theme of grief ?Begin verbalizing feelings associated with the  loss ?Attend a grief support group ?express thoughts and feelings about the deceased ?Identify and voice positives about the deceased ?implement acts of spiritual faith   ? ? ?Interventions ?create a safe environment and actively build trust ?use empathy, compassion, and support ?ask the patient to write a letter to the lost person ?conduct empty chair ?ask the patient to discuss and list the positives and negative aspects of the person ?encourage patient to rely upon his/her spiritual faith  ?ask client to read books on grief ?ask patient to watch videos about grief ?assist patient in identifying emotions  ?ask patient to attend support group  ? ?The patient and clinician reviewed the treatment plan on 03/27/2022. The patient approved of the treatment plan.  ? ?Conception Chancy, PsyD ? ?

## 2022-04-27 ENCOUNTER — Telehealth: Payer: Self-pay

## 2022-04-27 ENCOUNTER — Other Ambulatory Visit: Payer: Self-pay

## 2022-04-27 MED ORDER — CIPROFLOXACIN HCL 250 MG PO TABS: 250.0000 mg | ORAL_TABLET | Freq: Two times a day (BID) | ORAL | 0 refills | Status: DC

## 2022-04-27 NOTE — Telephone Encounter (Signed)
Pt called that she still having back pain and abdominal cramp as per alyssa advised urine culture don't have any growth we send cipro for 3 days and take AZO OTC and if she not feeling better we can do referral for Urology for further evaluation

## 2022-05-01 ENCOUNTER — Ambulatory Visit: Payer: Medicare Other | Admitting: Nurse Practitioner

## 2022-05-09 DIAGNOSIS — M25562 Pain in left knee: Secondary | ICD-10-CM | POA: Diagnosis not present

## 2022-05-09 DIAGNOSIS — G8929 Other chronic pain: Secondary | ICD-10-CM | POA: Diagnosis not present

## 2022-05-09 DIAGNOSIS — M545 Low back pain, unspecified: Secondary | ICD-10-CM | POA: Diagnosis not present

## 2022-05-09 DIAGNOSIS — M62838 Other muscle spasm: Secondary | ICD-10-CM | POA: Diagnosis not present

## 2022-05-09 DIAGNOSIS — Z79899 Other long term (current) drug therapy: Secondary | ICD-10-CM | POA: Diagnosis not present

## 2022-05-16 ENCOUNTER — Telehealth: Payer: Self-pay

## 2022-05-16 ENCOUNTER — Encounter: Payer: Self-pay | Admitting: Nurse Practitioner

## 2022-05-16 ENCOUNTER — Ambulatory Visit (INDEPENDENT_AMBULATORY_CARE_PROVIDER_SITE_OTHER): Payer: Medicare Other | Admitting: Psychologist

## 2022-05-16 DIAGNOSIS — F331 Major depressive disorder, recurrent, moderate: Secondary | ICD-10-CM | POA: Diagnosis not present

## 2022-05-16 MED ORDER — ALPRAZOLAM 0.5 MG PO TABS: 0.2500 mg | ORAL_TABLET | Freq: Two times a day (BID) | ORAL | 0 refills | Status: DC | PRN

## 2022-05-16 NOTE — Progress Notes (Signed)
[name] [name] Counselor/Therapist Progress Note  Patient ID: [name] [name], MRN: [id number],    Date: 05/16/2022  Time Spent: 10:06 am to 10:30 am; total time: 24 minutes   This session was held via video webex teletherapy due to the coronavirus risk at this time. The patient consented to video teletherapy and was located in a vehicle during this session. She is aware it is the responsibility of the patient to secure confidentiality on her end of the session. The provider was in a private home office for the duration of this session. Limits of confidentiality were discussed with the patient.   Treatment Type: Individual Therapy  Reported Symptoms: Grief  Mental Status Exam: Appearance:  Casual     Behavior: Appropriate  Motor: Normal  Speech/Language:  Clear and Coherent  Affect: Appropriate  Mood: normal  Thought process: normal  Thought content:   WNL  Sensory/Perceptual disturbances:   WNL  Orientation: oriented to person, place, and time/date  Attention: Good  Concentration: Good  Memory: WNL  Fund of knowledge:  Fair  Insight:   Fair  Judgment:  Fair  Impulse Control: Good   Risk Assessment: Danger to Self:  No Self-injurious Behavior: No Danger to Others: No Duty to Warn:no Physical Aggression / Violence:No  Access to Firearms a concern: No  Gang Involvement:No   Subjective: Beginning the session, patient described herself as doing poorly as the 6 month anniversary of her fiance's death is on 03-09-23. She briefly attempted to explore how to express her emotions, but experienced a difficult time with her thoughts. Patient asked for coping strategies. Patient participated in mindfulness and voiced that it helped. She had to end the session early due to being in a location with poor reception.  She was agreeable to homework and following up. She denied suicidal and homicidal ideation.    Interventions:  Worked on developing a therapeutic relationship  with the patient using active listening and reflective statements. Provided emotional support using empathy and validation. Reflected on events since the last session. Processed what about March 09, 2023 was proving to be most challenging. Explored the idea of writing to deceased fiance. Provided psychoeducation about empty chair technique. Normalized and validated expressed thoughts and emotions. Provided psychoeducation about mindfulness and mindful moments. Practiced and processed mindfulness. Praised patient for experiencing less distress. Assisted in problem solving. Began to introduce the concept of cognitive defusion. Assigned homework. Assessed for suicidal and homicidal ideation.   Homework: Implement mindfulness  Next Session: Review homework, cognitive defusion, guided imagery, PMR, and emotional support.   Diagnosis: F33.1 major depressive affective disorder, recurrent, moderate and Z30.8 uncomplicated bereavement.   Plan:   Goals Begin a healthy grieving process   Objectives target date for all objectives is 03/24/2023 Tell in detail the story of the current loss that is triggering symptoms Read books on the topic of grief Watch videos on the theme of grief Begin verbalizing feelings associated with the loss Attend a grief support group express thoughts and feelings about the deceased Identify and voice positives about the deceased implement acts of spiritual faith    Interventions create a safe environment and actively build trust use empathy, compassion, and support ask the patient to write a letter to the lost person conduct empty chair ask the patient to discuss and list the positives and negative aspects of the person encourage patient to rely upon his/her spiritual faith  ask client to read books on grief ask patient to watch videos about grief assist patient  in identifying emotions  ask patient to attend support group   The patient and clinician reviewed the  treatment plan on 03/27/2022. The patient approved of the treatment plan.   Conception Chancy, PsyD

## 2022-05-17 NOTE — Telephone Encounter (Signed)
Pt advised that  [name] [name] send new pres

## 2022-06-04 ENCOUNTER — Other Ambulatory Visit: Payer: Self-pay | Admitting: Nurse Practitioner

## 2022-06-04 ENCOUNTER — Encounter: Payer: Self-pay | Admitting: Nurse Practitioner

## 2022-06-04 DIAGNOSIS — E1165 Type 2 diabetes mellitus with hyperglycemia: Secondary | ICD-10-CM

## 2022-06-04 MED ORDER — SEMAGLUTIDE(0.25 OR 0.5MG/DOS) 2 MG/1.5ML ~~LOC~~ SOPN: 0.2500 mg | PEN_INJECTOR | SUBCUTANEOUS | 2 refills | Status: DC

## 2022-06-07 ENCOUNTER — Ambulatory Visit: Payer: Medicare Other | Admitting: Psychologist

## 2022-06-08 DIAGNOSIS — M62838 Other muscle spasm: Secondary | ICD-10-CM | POA: Diagnosis not present

## 2022-06-08 DIAGNOSIS — G8929 Other chronic pain: Secondary | ICD-10-CM | POA: Diagnosis not present

## 2022-06-08 DIAGNOSIS — M25562 Pain in left knee: Secondary | ICD-10-CM | POA: Diagnosis not present

## 2022-06-08 DIAGNOSIS — M545 Low back pain, unspecified: Secondary | ICD-10-CM | POA: Diagnosis not present

## 2022-06-08 DIAGNOSIS — Z79899 Other long term (current) drug therapy: Secondary | ICD-10-CM | POA: Diagnosis not present

## 2022-06-15 ENCOUNTER — Other Ambulatory Visit: Payer: Self-pay | Admitting: Nurse Practitioner

## 2022-06-15 ENCOUNTER — Encounter: Payer: Self-pay | Admitting: Nurse Practitioner

## 2022-06-15 DIAGNOSIS — E559 Vitamin D deficiency, unspecified: Secondary | ICD-10-CM

## 2022-06-16 ENCOUNTER — Telehealth: Payer: Self-pay

## 2022-06-16 NOTE — Telephone Encounter (Signed)
Pt called that she had UTI,fever as per alyssa advised her go to urgent care

## 2022-07-04 ENCOUNTER — Encounter: Payer: Self-pay | Admitting: Internal Medicine

## 2022-07-04 ENCOUNTER — Ambulatory Visit (INDEPENDENT_AMBULATORY_CARE_PROVIDER_SITE_OTHER): Payer: Medicare Other | Admitting: Internal Medicine

## 2022-07-04 ENCOUNTER — Telehealth: Payer: Self-pay

## 2022-07-04 VITALS — BP 122/84 | HR 88 | Temp 98.3°F | Resp 16 | Ht 61.0 in | Wt 159.2 lb

## 2022-07-04 DIAGNOSIS — E1165 Type 2 diabetes mellitus with hyperglycemia: Secondary | ICD-10-CM

## 2022-07-04 DIAGNOSIS — G8929 Other chronic pain: Secondary | ICD-10-CM | POA: Diagnosis not present

## 2022-07-04 DIAGNOSIS — M797 Fibromyalgia: Secondary | ICD-10-CM | POA: Diagnosis not present

## 2022-07-04 DIAGNOSIS — F332 Major depressive disorder, recurrent severe without psychotic features: Secondary | ICD-10-CM

## 2022-07-04 DIAGNOSIS — F322 Major depressive disorder, single episode, severe without psychotic features: Secondary | ICD-10-CM

## 2022-07-04 DIAGNOSIS — M62838 Other muscle spasm: Secondary | ICD-10-CM | POA: Diagnosis not present

## 2022-07-04 DIAGNOSIS — F5101 Primary insomnia: Secondary | ICD-10-CM | POA: Diagnosis not present

## 2022-07-04 DIAGNOSIS — F411 Generalized anxiety disorder: Secondary | ICD-10-CM

## 2022-07-04 DIAGNOSIS — R300 Dysuria: Secondary | ICD-10-CM | POA: Diagnosis not present

## 2022-07-04 DIAGNOSIS — M25562 Pain in left knee: Secondary | ICD-10-CM | POA: Diagnosis not present

## 2022-07-04 DIAGNOSIS — M545 Low back pain, unspecified: Secondary | ICD-10-CM | POA: Diagnosis not present

## 2022-07-04 DIAGNOSIS — N3000 Acute cystitis without hematuria: Secondary | ICD-10-CM

## 2022-07-04 DIAGNOSIS — Z79899 Other long term (current) drug therapy: Secondary | ICD-10-CM | POA: Diagnosis not present

## 2022-07-04 LAB — POCT URINALYSIS DIPSTICK
Bilirubin, UA: NEGATIVE
Glucose, UA: POSITIVE — AB
Ketones, UA: NEGATIVE
Nitrite, UA: NEGATIVE
Protein, UA: POSITIVE — AB
Spec Grav, UA: 1.01 (ref 1.010–1.025)
Urobilinogen, UA: 0.2 E.U./dL
pH, UA: 7 (ref 5.0–8.0)

## 2022-07-04 LAB — POCT GLYCOSYLATED HEMOGLOBIN (HGB A1C): Hemoglobin A1C: 6.4 % — AB (ref 4.0–5.6)

## 2022-07-04 MED ORDER — MIRTAZAPINE 15 MG PO TABS: 15.0000 mg | ORAL_TABLET | Freq: Every day | ORAL | 3 refills | Status: DC

## 2022-07-04 MED ORDER — ALPRAZOLAM 0.5 MG PO TABS: 0.2500 mg | ORAL_TABLET | Freq: Two times a day (BID) | ORAL | 0 refills | Status: DC | PRN

## 2022-07-04 MED ORDER — SEMAGLUTIDE (1 MG/DOSE) 4 MG/3ML ~~LOC~~ SOPN: 1.0000 mg | PEN_INJECTOR | SUBCUTANEOUS | 1 refills | Status: DC

## 2022-07-04 MED ORDER — NITROFURANTOIN MONOHYD MACRO 100 MG PO CAPS: ORAL_CAPSULE | ORAL | 0 refills | Status: DC

## 2022-07-04 NOTE — Telephone Encounter (Signed)
Spoke with phar and gave direction for macrobid 100 mg  1 tab po twice a days for 10 days

## 2022-07-04 NOTE — Progress Notes (Signed)
[facility] [place], [place] [address]  Internal MEDICINE  Office Visit Note  Patient Name: [name] [name]  [id number]  [id number]  Date of Service: 07/11/2022  Chief Complaint  Patient presents with   Follow-up   Hyperlipidemia   Diabetes   Recurrent UTI    Last round of antibiotics did not help, symptoms are recurrent - urine is cloudy, back pain (rate of 7/10)    HPI Patient has multiple complaints today. 1.  Patient is having back pain especially in the left flank area urine is cloudy, patient has been treated in the past with antibiotics 2.  Patient has chronic back pain and is seen by pain management chronic narcotic therapy, narcotic dependence. 3.  Patient is on [place] and for diabetic control and has done well denies any vaginal itching at this time. 4.  She is complaining of her  depression not being under control she is taking Prozac 60 mg has tried other multiple medications in the past before better control, patient has difficulty sleeping at night and stays anxious during the day as well, she does take low-dose Xanax only as needed,, patient has underlying PTSD as well    Current Medication: Outpatient Encounter Medications as of 07/04/2022  Medication Sig   ACCU-CHEK AVIVA PLUS test strip Use 1 strip to check glucose level at least once daily E11.65   Accu-Chek Softclix Lancets lancets USE AS INSTRUCTED to check blood sugars once a day   albuterol (VENTOLIN HFA) 108 (90 Base) MCG/ACT inhaler Inhale 1-2 puffs into the lungs every 6 (six) hours as needed for wheezing or shortness of breath.   ammonium lactate (LAC-HYDRIN) 12 % lotion Apply 1 application topically as needed for dry skin.   Cholecalciferol 25 MCG (1000 UT) tablet Take 1 tablet (1,000 Units total) by mouth once a week.   dapagliflozin propanediol (FARXIGA) 10 MG TABS tablet Take 1 tablet (10 mg total) by mouth daily.   diclofenac Sodium (VOLTAREN) 1 % GEL SMARTSIG:2-4 Gram(s)  Topical 1-4 Times Daily PRN   fenofibrate (TRICOR) 145 MG tablet Take by mouth.   FLUoxetine (PROZAC) 20 MG capsule TAKE 1 CAPSULE BY MOUTH EVERY DAY   FLUoxetine (PROZAC) 40 MG capsule Take 1 capsule (40 mg total) by mouth daily.   fluticasone (FLONASE) 50 MCG/ACT nasal spray Place 2 sprays into both nostrils daily.   ibuprofen (ADVIL) 800 MG tablet Take 1 tablet (800 mg total) by mouth every 6 (six) hours as needed.   insulin glargine, 2 Unit Dial, (TOUJEO MAX SOLOSTAR) 300 UNIT/ML Solostar Pen Inject 35 Units into the skin daily.   Insulin Pen Needle 31G X 5 MM MISC Use as directed  Once a day Dx e11.65   Ipratropium-Albuterol (COMBIVENT RESPIMAT) 20-100 MCG/ACT AERS respimat Inhale 1 puff into the lungs every 6 (six) hours as needed for wheezing.   ipratropium-albuterol (DUONEB) 0.5-2.5 (3) MG/3ML SOLN Take 3 mLs by nebulization every 4 (four) hours as needed (wheezing,SOB and cough).   levothyroxine (SYNTHROID) 50 MCG tablet TAKE 1 TABLET BY MOUTH EVERY DAY   methocarbamol (ROBAXIN) 500 MG tablet PLEASE SEE ATTACHED FOR DETAILED DIRECTIONS   morphine (MS CONTIN) 60 MG 12 hr tablet Take 60 mg by mouth every 12 (twelve) hours.   Multiple Vitamin (MULTI-VITAMIN) tablet Take 1 tablet by mouth daily.   nystatin (MYCOSTATIN/NYSTOP) powder Apply 1 application. topically 3 (three) times daily.   pantoprazole (PROTONIX) 40 MG tablet TAKE 1 TABLET BY MOUTH EVERY DAY  pravastatin (PRAVACHOL) 10 MG tablet Take 1 tablet (10 mg total) by mouth daily.   pregabalin (LYRICA) 150 MG capsule Take 150 mg by mouth 3 (three) times daily.    SUMAtriptan (IMITREX) 100 MG tablet Take 100 mg by mouth every 2 (two) hours as needed for migraine. May repeat in 2 hours if headache persists or recurs.   triamcinolone ointment (KENALOG) 0.1 % Apply to affected area daily for two weeks   Vitamin D, Ergocalciferol, (DRISDOL) 1.25 MG (50000 UNIT) CAPS capsule Take 1 capsule (50,000 Units total) by mouth every 7 (seven) days.    [DISCONTINUED] ALPRAZolam (XANAX) 0.5 MG tablet Take 0.5 tablets (0.25 mg total) by mouth 2 (two) times daily as needed for anxiety.   [DISCONTINUED] ALPRAZolam (XANAX) 0.5 MG tablet Take 0.5 tablets (0.25 mg total) by mouth 2 (two) times daily as needed for anxiety.   [DISCONTINUED] benzonatate (TESSALON) 100 MG capsule Take 1 capsule (100 mg total) by mouth 2 (two) times daily as needed for cough.   [DISCONTINUED] cetirizine (ZYRTEC) 10 MG tablet Take 1 tablet (10 mg total) by mouth daily.   [DISCONTINUED] ciprofloxacin (CIPRO) 250 MG tablet Take 1 tablet (250 mg total) by mouth 2 (two) times daily.   [DISCONTINUED] HYDROmorphone (DILAUDID) 4 MG tablet Take by mouth every 6 (six) hours as needed for severe pain.   [DISCONTINUED] hydrOXYzine (VISTARIL) 25 MG capsule Take 1 capsule (25 mg total) by mouth every 8 (eight) hours as needed.   [DISCONTINUED] mirtazapine (REMERON) 15 MG tablet Take 1 tablet (15 mg total) by mouth at bedtime.   [DISCONTINUED] nitrofurantoin, macrocrystal-monohydrate, (MACROBID) 100 MG capsule Use as instructed   [DISCONTINUED] promethazine (PHENERGAN) 25 MG tablet Take 1 tablet (25 mg total) by mouth every 6 (six) hours as needed for nausea or vomiting.   [DISCONTINUED] Semaglutide, 1 MG/DOSE, 4 MG/3ML SOPN Inject 1 mg as directed once a week.   [DISCONTINUED] Semaglutide,0.25 or 0.'5MG'$ /DOS, 2 MG/1.5ML SOPN Inject 0.25 mg into the skin once a week. For the first 4 weeks. Then increase to 0.5 mg once a week.   ALPRAZolam (XANAX) 0.5 MG tablet Take 0.5 tablets (0.25 mg total) by mouth 2 (two) times daily as needed for anxiety.   mirtazapine (REMERON) 15 MG tablet Take 1 tablet (15 mg total) by mouth at bedtime.   nitrofurantoin, macrocrystal-monohydrate, (MACROBID) 100 MG capsule Take 1 capsule by mouth twice a day for 10 days   Semaglutide, 1 MG/DOSE, 4 MG/3ML SOPN Inject 1 mg as directed once a week.   [DISCONTINUED] nitrofurantoin, macrocrystal-monohydrate, (MACROBID)  100 MG capsule Use as instructed   No facility-administered encounter medications on file as of 07/04/2022.    Surgical History: Past Surgical History:  Procedure Laterality Date   ABDOMINAL HYSTERECTOMY     CARPAL TUNNEL RELEASE     FOOT TENDON SURGERY Right    KNEE SURGERY     SPINAL FUSION     SPINAL FUSION      Medical History: Past Medical History:  Diagnosis Date   Actinic keratitis    Allergy    Anxiety    Asthma    Basal cell carcinoma    Chronic venous insufficiency    DDD (degenerative disc disease), lumbar    Diabetes mellitus without complication (HCC)    Fibromyalgia    Fibromyalgia    Hyperlipidemia    Lymphedema    Melanoma ([place])    Migraine    Mitral valve prolapse    Tendonitis of foot  left    Family History: Family History  Problem Relation Age of Onset   Cancer Paternal Aunt    Cancer Paternal Uncle    Diabetes Paternal Grandfather    Heart disease Paternal Grandfather     Social History   Socioeconomic History   Marital status: Divorced    Spouse name: Not on file   Number of children: Not on file   Years of education: Not on file   Highest education level: Not on file  Occupational History   Not on file  Tobacco Use   Smoking status: Never   Smokeless tobacco: Never  Vaping Use   Vaping Use: Never used  Substance and Sexual Activity   Alcohol use: No   Drug use: No   Sexual activity: Not on file  Other Topics Concern   Not on file  Social History Narrative   Not on file   Social Determinants of Health   Financial Resource Strain: Not on file  Food Insecurity: Not on file  Transportation Needs: Not on file  Physical Activity: Not on file  Stress: Not on file  Social Connections: Not on file  Intimate Partner Violence: Not on file      Review of Systems  Constitutional:  Negative for chills, fatigue, fever and unexpected weight change.  HENT:  Negative for congestion, mouth sores, postnasal drip, rhinorrhea,  sneezing and sore throat.   Eyes:  Negative for redness.  Respiratory:  Negative for cough, chest tightness and shortness of breath.   Cardiovascular:  Negative for chest pain and palpitations.  Gastrointestinal:  Negative for abdominal pain, constipation, diarrhea, nausea and vomiting.  Genitourinary:  Positive for dysuria and pelvic pain. Negative for flank pain and frequency.  Musculoskeletal:  Negative for arthralgias, back pain, joint swelling and neck pain.  Skin:  Negative for rash.  Neurological: Negative.  Negative for tremors and numbness.  Hematological:  Negative for adenopathy. Does not bruise/bleed easily.  Psychiatric/Behavioral:  Positive for dysphoric mood and sleep disturbance. Negative for behavioral problems (Depression) and suicidal ideas. The patient is nervous/anxious.     Vital Signs: BP 122/84   Pulse 88   Temp 98.3 F (36.8 C)   Resp 16   Ht '5\' 1"'$  (1.549 m)   Wt 159 lb 3.2 oz (72.2 kg)   LMP 12/13/1991   SpO2 98%   BMI 30.08 kg/m    Physical Exam Constitutional:      Appearance: Normal appearance.  HENT:     Head: Normocephalic and atraumatic.     Nose: Nose normal.     Mouth/Throat:     Mouth: Mucous membranes are moist.     Pharynx: No posterior oropharyngeal erythema.  Eyes:     Extraocular Movements: Extraocular movements intact.     Pupils: Pupils are equal, round, and reactive to light.  Cardiovascular:     Pulses: Normal pulses.     Heart sounds: Normal heart sounds.  Pulmonary:     Effort: Pulmonary effort is normal.     Breath sounds: Normal breath sounds.  Neurological:     General: No focal deficit present.     Mental Status: She is alert.  Psychiatric:        Mood and Affect: Mood normal.        Behavior: Behavior normal.        Assessment/Plan: 1. Uncontrolled type 2 diabetes mellitus with hyperglycemia ([place]) We will continue [place] as before she is on 10 mg increase her dose  of Ozempic for better control her A1c is  improving - POCT HgB A1C - Semaglutide, 1 MG/DOSE, 4 MG/3ML SOPN; Inject 1 mg as directed once a week.  Dispense: 3 mL; Refill: 1  2. Acute cystitis without hematuria We will start Macrobid and then adjust antibiotics according to her cultures and sensitivities - POCT Urinalysis Dipstick - CULTURE, URINE COMPREHENSIVE - nitrofurantoin, macrocrystal-monohydrate, (MACROBID) 100 MG capsule; Take 1 capsule by mouth twice a day for 10 days  Dispense: 20 capsule; Refill: 0  3. Severe episode of recurrent major depressive disorder, without psychotic features ([place]) Patient was instructed to continue on her Prozac 60 mg and take it during the day instead of taking at night awakenings for insomnia - Ambulatory referral to Psychiatry  4. Primary insomnia We will add low-dose mirtazapine until patient is seen by psych - mirtazapine (REMERON) 15 MG tablet; Take 1 tablet (15 mg total) by mouth at bedtime.  Dispense: 30 tablet; Refill: 3  5. GAD (generalized anxiety disorder) PDMP reviewed, patient is also followed by pain management for chronic back pain on narcotics, ORS score is high, low-dose Xanax with limited number is given today and the patient is seen by psych - ALPRAZolam (XANAX) 0.5 MG tablet; Take 0.5 tablets (0.25 mg total) by mouth 2 (two) times daily as needed for anxiety.  Dispense: 25 tablet; Refill: 0   General Counseling: Taysia verbalizes understanding of the findings of todays visit and agrees with plan of treatment. I have discussed any further diagnostic evaluation that may be needed or ordered today. We also reviewed her medications today. she has been encouraged to call the office with any questions or concerns that should arise related to todays visit.    Orders Placed This Encounter  Procedures   CULTURE, URINE COMPREHENSIVE   Ambulatory referral to Psychiatry   POCT HgB A1C   POCT Urinalysis Dipstick    Meds ordered this encounter  Medications   DISCONTD: ALPRAZolam  (XANAX) 0.5 MG tablet    Sig: Take 0.5 tablets (0.25 mg total) by mouth 2 (two) times daily as needed for anxiety.    Dispense:  25 tablet    Refill:  0   DISCONTD: mirtazapine (REMERON) 15 MG tablet    Sig: Take 1 tablet (15 mg total) by mouth at bedtime.    Dispense:  30 tablet    Refill:  3   DISCONTD: Semaglutide, 1 MG/DOSE, 4 MG/3ML SOPN    Sig: Inject 1 mg as directed once a week.    Dispense:  3 mL    Refill:  1   DISCONTD: nitrofurantoin, macrocrystal-monohydrate, (MACROBID) 100 MG capsule    Sig: Use as instructed    Dispense:  20 capsule    Refill:  0   ALPRAZolam (XANAX) 0.5 MG tablet    Sig: Take 0.5 tablets (0.25 mg total) by mouth 2 (two) times daily as needed for anxiety.    Dispense:  25 tablet    Refill:  0   mirtazapine (REMERON) 15 MG tablet    Sig: Take 1 tablet (15 mg total) by mouth at bedtime.    Dispense:  30 tablet    Refill:  3   DISCONTD: nitrofurantoin, macrocrystal-monohydrate, (MACROBID) 100 MG capsule    Sig: Use as instructed    Dispense:  20 capsule    Refill:  0   Semaglutide, 1 MG/DOSE, 4 MG/3ML SOPN    Sig: Inject 1 mg as directed once a week.    Dispense:  3 mL    Refill:  1   nitrofurantoin, macrocrystal-monohydrate, (MACROBID) 100 MG capsule    Sig: Take 1 capsule by mouth twice a day for 10 days    Dispense:  20 capsule    Refill:  0    Total time spent:35 Minutes Time spent includes review of chart, medications, test results, and follow up plan with the patient.   [place] Controlled Substance Database was reviewed by me (MME and ORS both are high)    Dr Lavera Guise Internal medicine

## 2022-07-04 NOTE — Telephone Encounter (Signed)
Spoke with patient to report that eye exam does not show diabetic retinopathy.

## 2022-07-06 ENCOUNTER — Telehealth: Payer: Self-pay

## 2022-07-06 NOTE — Telephone Encounter (Signed)
Awaiting 07/04/22 office notes for [facility] referral-[place]

## 2022-07-07 LAB — CULTURE, URINE COMPREHENSIVE

## 2022-07-10 ENCOUNTER — Encounter: Payer: Self-pay | Admitting: Internal Medicine

## 2022-07-11 ENCOUNTER — Telehealth: Payer: Self-pay

## 2022-07-11 NOTE — Telephone Encounter (Signed)
[facility] referral faxed to [facility]

## 2022-07-11 NOTE — Telephone Encounter (Signed)
[facility] does not accept patient's insurance. [facility] referral faxed to [facility] in [place]

## 2022-07-11 NOTE — Progress Notes (Signed)
Pt is on macrobid

## 2022-07-11 NOTE — Progress Notes (Signed)
Lmom to call us back 

## 2022-07-12 ENCOUNTER — Other Ambulatory Visit: Payer: Self-pay

## 2022-07-12 ENCOUNTER — Telehealth: Payer: Self-pay

## 2022-07-12 MED ORDER — NITROFURANTOIN MONOHYD MACRO 100 MG PO CAPS: ORAL_CAPSULE | ORAL | 0 refills | Status: DC

## 2022-07-12 NOTE — Telephone Encounter (Signed)
Spoke with pt she still having Uti  symptoms as per dr Humphrey Rolls advised that we send more antibiotic macrobid special direction Take 1 tab po bid for 2 weeks and then take tab BID M,W and F until finished and she is not feeling better on her next talk to alyssa we can order Ultrasound

## 2022-07-12 NOTE — Telephone Encounter (Signed)
Spoke with she is not feeling better she had 2 days antibiotic left

## 2022-07-13 ENCOUNTER — Emergency Department: Payer: Medicare Other

## 2022-07-13 ENCOUNTER — Emergency Department
Admission: EM | Admit: 2022-07-13 | Discharge: 2022-07-13 | Disposition: A | Discharge status: Home / Self Care | Payer: Medicare Other | Attending: Emergency Medicine | Admitting: Emergency Medicine

## 2022-07-13 ENCOUNTER — Ambulatory Visit (INDEPENDENT_AMBULATORY_CARE_PROVIDER_SITE_OTHER): Payer: Medicare Other | Admitting: Nurse Practitioner

## 2022-07-13 ENCOUNTER — Other Ambulatory Visit: Payer: Self-pay

## 2022-07-13 VITALS — BP 128/68 | HR 90 | Temp 98.2°F | Resp 16 | Ht 61.0 in | Wt 165.2 lb

## 2022-07-13 DIAGNOSIS — R519 Headache, unspecified: Secondary | ICD-10-CM | POA: Diagnosis present

## 2022-07-13 DIAGNOSIS — R29810 Facial weakness: Secondary | ICD-10-CM | POA: Insufficient documentation

## 2022-07-13 DIAGNOSIS — G459 Transient cerebral ischemic attack, unspecified: Secondary | ICD-10-CM | POA: Diagnosis not present

## 2022-07-13 DIAGNOSIS — N390 Urinary tract infection, site not specified: Secondary | ICD-10-CM | POA: Diagnosis not present

## 2022-07-13 DIAGNOSIS — R4701 Aphasia: Secondary | ICD-10-CM | POA: Diagnosis not present

## 2022-07-13 DIAGNOSIS — R2990 Unspecified symptoms and signs involving the nervous system: Secondary | ICD-10-CM

## 2022-07-13 DIAGNOSIS — R300 Dysuria: Secondary | ICD-10-CM

## 2022-07-13 DIAGNOSIS — R9431 Abnormal electrocardiogram [ECG] [EKG]: Secondary | ICD-10-CM | POA: Diagnosis not present

## 2022-07-13 DIAGNOSIS — Z743 Need for continuous supervision: Secondary | ICD-10-CM | POA: Diagnosis not present

## 2022-07-13 DIAGNOSIS — E785 Hyperlipidemia, unspecified: Secondary | ICD-10-CM | POA: Diagnosis not present

## 2022-07-13 DIAGNOSIS — R739 Hyperglycemia, unspecified: Secondary | ICD-10-CM | POA: Diagnosis not present

## 2022-07-13 DIAGNOSIS — M549 Dorsalgia, unspecified: Secondary | ICD-10-CM | POA: Diagnosis not present

## 2022-07-13 DIAGNOSIS — E1165 Type 2 diabetes mellitus with hyperglycemia: Secondary | ICD-10-CM | POA: Diagnosis not present

## 2022-07-13 DIAGNOSIS — R479 Unspecified speech disturbances: Secondary | ICD-10-CM | POA: Diagnosis not present

## 2022-07-13 DIAGNOSIS — R079 Chest pain, unspecified: Secondary | ICD-10-CM

## 2022-07-13 DIAGNOSIS — M797 Fibromyalgia: Secondary | ICD-10-CM | POA: Diagnosis not present

## 2022-07-13 DIAGNOSIS — G35 Multiple sclerosis: Secondary | ICD-10-CM | POA: Diagnosis not present

## 2022-07-13 DIAGNOSIS — R4781 Slurred speech: Secondary | ICD-10-CM | POA: Diagnosis not present

## 2022-07-13 DIAGNOSIS — R29818 Other symptoms and signs involving the nervous system: Secondary | ICD-10-CM | POA: Diagnosis not present

## 2022-07-13 LAB — CBC
HCT: 42.1 % (ref 36.0–46.0)
Hemoglobin: 14.3 g/dL (ref 12.0–15.0)
MCH: 29.4 pg (ref 26.0–34.0)
MCHC: 34 g/dL (ref 30.0–36.0)
MCV: 86.6 fL (ref 80.0–100.0)
Platelets: 258 10*3/uL (ref 150–400)
RBC: 4.86 MIL/uL (ref 3.87–5.11)
RDW: 12.9 % (ref 11.5–15.5)
WBC: 4.7 10*3/uL (ref 4.0–10.5)
nRBC: 0 % (ref 0.0–0.2)

## 2022-07-13 LAB — URINALYSIS, COMPLETE (UACMP) WITH MICROSCOPIC
Bilirubin Urine: NEGATIVE
Glucose, UA: >=500 mg/dL — AB
Ketones, ur: NEGATIVE mg/dL
Leukocytes,Ua: NEGATIVE
Nitrite: NEGATIVE
Protein, ur: NEGATIVE mg/dL
Specific Gravity, Urine: 1.013 (ref 1.005–1.030)
pH: 7 (ref 5.0–8.0)

## 2022-07-13 LAB — GLUCOSE, POCT (MANUAL RESULT ENTRY): POC Glucose: 250 mg/dl — AB (ref 70–99)

## 2022-07-13 LAB — POCT URINALYSIS DIPSTICK
Bilirubin, UA: NEGATIVE
Glucose, UA: POSITIVE — AB
Leukocytes, UA: NEGATIVE
Nitrite, UA: NEGATIVE
Protein, UA: NEGATIVE
Spec Grav, UA: 1.015 (ref 1.010–1.025)
Urobilinogen, UA: 0.2 E.U./dL
pH, UA: 6.5 (ref 5.0–8.0)

## 2022-07-13 LAB — COMPREHENSIVE METABOLIC PANEL
ALT: 29 U/L (ref 0–44)
AST: 33 U/L (ref 15–41)
Albumin: 3.8 g/dL (ref 3.5–5.0)
Alkaline Phosphatase: 115 U/L (ref 38–126)
Anion gap: 8 (ref 5–15)
BUN: 10 mg/dL (ref 6–20)
CO2: 28 mmol/L (ref 22–32)
Calcium: 8.7 mg/dL — ABNORMAL LOW (ref 8.9–10.3)
Chloride: 100 mmol/L (ref 98–111)
Creatinine, Ser: 1.07 mg/dL — ABNORMAL HIGH (ref 0.44–1.00)
GFR, Estimated: >60 mL/min (ref >60)
Glucose, Bld: 292 mg/dL — ABNORMAL HIGH (ref 70–99)
Potassium: 4.4 mmol/L (ref 3.5–5.1)
Sodium: 136 mmol/L (ref 135–145)
Total Bilirubin: 0.9 mg/dL (ref 0.3–1.2)
Total Protein: 6.7 g/dL (ref 6.5–8.1)

## 2022-07-13 LAB — TROPONIN I (HIGH SENSITIVITY): Troponin I (High Sensitivity): 2 ng/L (ref <18)

## 2022-07-13 LAB — CBG MONITORING, ED: Glucose-Capillary: 281 mg/dL — ABNORMAL HIGH (ref 70–99)

## 2022-07-13 MED ORDER — GADOBUTROL 1 MMOL/ML IV SOLN
7.0000 mL | Freq: Once | INTRAVENOUS | Status: AC | PRN
  Administered 2022-07-13: 7 mL via INTRAVENOUS

## 2022-07-13 MED ORDER — SODIUM CHLORIDE 0.9 % IV BOLUS
1000.0000 mL | Freq: Once | INTRAVENOUS | Status: AC
  Administered 2022-07-13: 1000 mL via INTRAVENOUS

## 2022-07-13 MED ORDER — MORPHINE SULFATE (PF) 4 MG/ML IV SOLN
4.0000 mg | Freq: Once | INTRAVENOUS | Status: AC
  Administered 2022-07-13: 4 mg via INTRAVENOUS
  Filled 2022-07-13: qty 1

## 2022-07-13 MED ORDER — SULFAMETHOXAZOLE-TRIMETHOPRIM 800-160 MG PO TABS: 1.0000 | ORAL_TABLET | Freq: Two times a day (BID) | ORAL | 0 refills | Status: AC

## 2022-07-13 NOTE — Discharge Instructions (Signed)
Please follow-up with your doctor regarding your symptoms today.  Reassuringly work-up in the emergency department including MRI with and without contrast of the brain is normal.

## 2022-07-13 NOTE — ED Notes (Signed)
ED Provider at bedside. 

## 2022-07-13 NOTE — ED Notes (Signed)
Signing pad did not work. Pt verbalized understanding of dc instructions.

## 2022-07-13 NOTE — ED Notes (Signed)
Patient returned to ED room via stretcher

## 2022-07-13 NOTE — Progress Notes (Signed)
[place], [place] [address]  Internal MEDICINE  Office Visit Note  Patient Name: [name] [name]  [id number]  [id number]  Date of Service: 07/13/2022  Chief Complaint  Patient presents with   Urinary Tract Infection    Symptoms started maybe a month ago got better and now back again, frequency but only goes very little, back pain and pain starting to go around side area   Stroke Symptoms    Started on 07/12/22, having times of stuttered speech, loss of words and pronouncing them, jerking uncontrollable thru out whole body, fatigue, was watching TV when it all started. Twitching in face when trying to talk.  Pt has stated this has happened to her a while back and now started back up and feels worse than before.     HPI [name] [name] presents for an acute sick visit for possible UTI and stroke like symptoms. Stroke-like symptoms -- sx started yesterday, reports stuttering speech, difficulty with expressing words and pronouncing them, involuntary jerking, twitching of face when attempting to speak. Reports that this happened before and now has started again but worse this time.  Possible UTI -- sx started 1 month ago, improved but then sx returned. Reports urinary frequency, urine amount decreased, back pain, and flank pain Diabetes --random glucose 250, last meal was 40 minutes.      Current Medication:  Outpatient Encounter Medications as of 07/13/2022  Medication Sig   ACCU-CHEK AVIVA PLUS test strip Use 1 strip to check glucose level at least once daily E11.65   Accu-Chek Softclix Lancets lancets USE AS INSTRUCTED to check blood sugars once a day   albuterol (VENTOLIN HFA) 108 (90 Base) MCG/ACT inhaler Inhale 1-2 puffs into the lungs every 6 (six) hours as needed for wheezing or shortness of breath.   ammonium lactate (LAC-HYDRIN) 12 % lotion Apply 1 application topically as needed for dry skin.   Cholecalciferol 25 MCG (1000 UT) tablet Take 1 tablet  (1,000 Units total) by mouth once a week.   diclofenac Sodium (VOLTAREN) 1 % GEL SMARTSIG:2-4 Gram(s) Topical 1-4 Times Daily PRN   FLUoxetine (PROZAC) 40 MG capsule Take 1 capsule (40 mg total) by mouth daily.   fluticasone (FLONASE) 50 MCG/ACT nasal spray Place 2 sprays into both nostrils daily.   ibuprofen (ADVIL) 800 MG tablet Take 1 tablet (800 mg total) by mouth every 6 (six) hours as needed.   insulin glargine, 2 Unit Dial, (TOUJEO MAX SOLOSTAR) 300 UNIT/ML Solostar Pen Inject 35 Units into the skin daily.   Insulin Pen Needle 31G X 5 MM MISC Use as directed  Once a day Dx e11.65   Ipratropium-Albuterol (COMBIVENT RESPIMAT) 20-100 MCG/ACT AERS respimat Inhale 1 puff into the lungs every 6 (six) hours as needed for wheezing.   levothyroxine (SYNTHROID) 50 MCG tablet TAKE 1 TABLET BY MOUTH EVERY DAY   methocarbamol (ROBAXIN) 500 MG tablet PLEASE SEE ATTACHED FOR DETAILED DIRECTIONS   morphine (MS CONTIN) 60 MG 12 hr tablet Take 60 mg by mouth every 12 (twelve) hours.   Multiple Vitamin (MULTI-VITAMIN) tablet Take 1 tablet by mouth daily.   nystatin (MYCOSTATIN/NYSTOP) powder Apply 1 application. topically 3 (three) times daily.   pravastatin (PRAVACHOL) 10 MG tablet Take 1 tablet (10 mg total) by mouth daily.   pregabalin (LYRICA) 150 MG capsule Take 150 mg by mouth 3 (three) times daily.    [EXPIRED] sulfamethoxazole-trimethoprim (BACTRIM DS) 800-160 MG tablet Take 1 tablet by mouth 2 (two) times daily  for 7 days.   triamcinolone ointment (KENALOG) 0.1 % Apply to affected area daily for two weeks   [DISCONTINUED] ALPRAZolam (XANAX) 0.5 MG tablet Take 0.5 tablets (0.25 mg total) by mouth 2 (two) times daily as needed for anxiety.   [DISCONTINUED] dapagliflozin propanediol (FARXIGA) 10 MG TABS tablet Take 1 tablet (10 mg total) by mouth daily.   [DISCONTINUED] fenofibrate (TRICOR) 145 MG tablet Take by mouth.   [DISCONTINUED] FLUoxetine (PROZAC) 20 MG capsule TAKE 1 CAPSULE BY MOUTH EVERY DAY    [DISCONTINUED] ipratropium-albuterol (DUONEB) 0.5-2.5 (3) MG/3ML SOLN Take 3 mLs by nebulization every 4 (four) hours as needed (wheezing,SOB and cough).   [DISCONTINUED] mirtazapine (REMERON) 15 MG tablet Take 1 tablet (15 mg total) by mouth at bedtime.   [DISCONTINUED] nitrofurantoin, macrocrystal-monohydrate, (MACROBID) 100 MG capsule Take 1 tab po bid for 2 weeks and then take tab BID M,W and F until finished   [DISCONTINUED] pantoprazole (PROTONIX) 40 MG tablet TAKE 1 TABLET BY MOUTH EVERY DAY   [DISCONTINUED] Semaglutide, 1 MG/DOSE, 4 MG/3ML SOPN Inject 1 mg as directed once a week.   [DISCONTINUED] SUMAtriptan (IMITREX) 100 MG tablet Take 100 mg by mouth every 2 (two) hours as needed for migraine. May repeat in 2 hours if headache persists or recurs.   [DISCONTINUED] Vitamin D, Ergocalciferol, (DRISDOL) 1.25 MG (50000 UNIT) CAPS capsule Take 1 capsule (50,000 Units total) by mouth every 7 (seven) days.   No facility-administered encounter medications on file as of 07/13/2022.      Medical History: Past Medical History:  Diagnosis Date   Actinic keratitis    Allergy    Anxiety    Asthma    Basal cell carcinoma    Chronic venous insufficiency    DDD (degenerative disc disease), lumbar    Diabetes mellitus without complication (HCC)    Fibromyalgia    Fibromyalgia    Hyperlipidemia    Lymphedema    Melanoma ([place])    Migraine    Mitral valve prolapse    Tendonitis of foot    left     Vital Signs: BP 128/68   Pulse 90   Temp 98.2 F (36.8 C)   Resp 16   Ht '5\' 1"'$  (1.549 m)   Wt 165 lb 3.2 oz (74.9 kg)   LMP 12/13/1991   SpO2 98%   BMI 31.21 kg/m    Review of Systems  Constitutional:  Positive for fatigue.  HENT: Negative.    Respiratory: Negative.  Negative for cough, chest tightness, shortness of breath and wheezing.   Cardiovascular: Negative.  Negative for chest pain and palpitations.  Gastrointestinal:  Negative for constipation, diarrhea, nausea and  vomiting.  Genitourinary:  Positive for decreased urine volume, dysuria, flank pain, frequency and urgency.  Musculoskeletal:  Positive for back pain.  Neurological:  Positive for tremors, facial asymmetry, speech difficulty and weakness.  Psychiatric/Behavioral:  Negative for sleep disturbance. The patient is nervous/anxious.     Physical Exam Vitals reviewed.  Constitutional:      General: She is not in acute distress.    Appearance: Normal appearance. She is not ill-appearing.  HENT:     Head: Normocephalic and atraumatic.  Eyes:     Pupils: Pupils are equal, round, and reactive to light.  Cardiovascular:     Rate and Rhythm: Normal rate and regular rhythm.  Pulmonary:     Effort: Pulmonary effort is normal. No respiratory distress.  Lymphadenopathy:     Cervical: No cervical adenopathy.  Neurological:  Mental Status: She is alert and oriented to person, place, and time.     Cranial Nerves: Dysarthria and facial asymmetry present.     Motor: Weakness and tremor present.     Coordination: Coordination is intact.     Gait: Gait is intact.  Psychiatric:        Mood and Affect: Mood normal.        Behavior: Behavior normal.       Assessment/Plan: 1. Stroke-like symptoms Facial asymmetry and dysarthria, concern for possible stroke or TIA. Highly recommend that the patient goes to [facility] ER and that we call EMS for transport. Due to the risk of a possible life-threatening situation developing, it will be safer to transport the patient via ambulance. She is agreeable with the plan. 911 was called at 2:45 PM.  2. Uncontrolled type 2 diabetes mellitus with hyperglycemia (HCC) Elevated glucose but her last meal was less than an hour ago.  - POCT Glucose (CBG)  3. Chest pain, unspecified type EKG shows NSR - EKG 12-Lead  4. Dysuria Blood in urine, culture sent - POCT Urinalysis Dipstick - CULTURE, URINE COMPREHENSIVE  5. Urinary tract infection without hematuria, site  unspecified Empiric antibiotic treatment prescribed, culture sent - sulfamethoxazole-trimethoprim (BACTRIM DS) 800-160 MG tablet; Take 1 tablet by mouth 2 (two) times daily for 7 days.  Dispense: 14 tablet; Refill: 0   General Counseling: Marinell verbalizes understanding of the findings of todays visit and agrees with plan of treatment. I have discussed any further diagnostic evaluation that may be needed or ordered today. We also reviewed her medications today. she has been encouraged to call the office with any questions or concerns that should arise related to todays visit.    Counseling:    Orders Placed This Encounter  Procedures   CULTURE, URINE COMPREHENSIVE   POCT Urinalysis Dipstick   POCT Glucose (CBG)   EKG 12-Lead    Meds ordered this encounter  Medications   sulfamethoxazole-trimethoprim (BACTRIM DS) 800-160 MG tablet    Sig: Take 1 tablet by mouth 2 (two) times daily for 7 days.    Dispense:  14 tablet    Refill:  0    Return for EMS called, patient transported to [facility] to be evaluated for stroke like symptoms.  [place] Controlled Substance Database was reviewed by me for overdose risk score (ORS)  Time spent:30 Minutes Time spent with patient included reviewing progress notes, labs, imaging studies, and discussing plan for follow up.   This patient was seen by Jonetta Osgood, FNP-C in collaboration with Dr. [place] Bigness as a part of collaborative care agreement.  Keisa Blow R. Valetta Fuller, MSN, FNP-C Internal Medicine

## 2022-07-13 NOTE — ED Notes (Signed)
Patient to MRI via stretcher.

## 2022-07-13 NOTE — ED Triage Notes (Addendum)
Patient brought in via ems from PCP with a complaint of worsening UTI and slurred speech, back pain, and headaches. Patient able to move self from stretcher to ED bed. LKW 1400 on 07/12/22

## 2022-07-13 NOTE — ED Provider Notes (Signed)
[facility] Provider Note    Event Date/Time   First MD Initiated Contact with Patient 07/13/22 1521     (approximate)  History   Chief Complaint: Aphasia  HPI  [name] [name] is a 46 y.o. female with a past medical history of anxiety, fibromyalgia, hyperlipidemia, presents to the emergency department for slurred speech and left facial droop.  According to the patient she has been experiencing some dysuria complaining of urinary tract infection and since yesterday at 2 PM she has had some difficulty with speech.  Patient states she is having both slurred speech as well as word finding difficulty and has noticed a left facial droop.  States a headache yesterday but none today.  Patient states his slurred speech has occurred intermittently over the past few years, but is more significant today.  Patient denies any weakness or numbness of any arm or leg.  No fever, no cough congestion vomiting or diarrhea.  Physical Exam   Triage Vital Signs: ED Triage Vitals  Enc Vitals Group     BP 07/13/22 1505 128/80     Pulse Rate 07/13/22 1505 83     Resp 07/13/22 1505 (!) 25     Temp 07/13/22 1505 98.8 F (37.1 C)     Temp src --      SpO2 07/13/22 1505 95 %     Weight 07/13/22 1508 170 lb (77.1 kg)     Height 07/13/22 1508 '5\' 1"'$  (1.549 m)     Head Circumference --      Peak Flow --      Pain Score 07/13/22 1508 8     Pain Loc --      Pain Edu? --      Excl. in [place]? --     Most recent vital signs: Vitals:   07/13/22 1530 07/13/22 1540  BP: 112/69   Pulse: 79 79  Resp: 17 13  Temp:    SpO2: 97% 97%    General: Awake, no distress.  CV:  Good peripheral perfusion.  Regular rate and rhythm  Resp:  Normal effort.  Equal breath sounds bilaterally.  Abd:  No distention.  Soft, nontender.  No rebound or guarding. Other:  Patient has equal grip strengths 5/5 motor bilateral upper extremities no pronator drift.  Does have 4+/5 strength in bilateral lower  extremities but no focal deficit noted.  Sensation intact and equal.  Patient possibly has the very slight left facial droop, eyebrows and eyelids do not appear infected.  Does have slight slurred speech at times during my examination.   ED Results / Procedures / Treatments   EKG  EKG viewed and interpreted by myself shows a normal sinus rhythm at 77 bpm with a narrow QRS, normal axis, normal intervals, no concerning ST changes.  No ST elevation.  RADIOLOGY  I personally viewed and interpreted the T2 images of the MRI of the brain.  I do not see any obvious acute CVA. Radiology is read questionable defect of the frontal lobe recommend MRI with contrast. MRI with contrast shows no abnormality.   MEDICATIONS ORDERED IN ED: Medications  sodium chloride 0.9 % bolus 1,000 mL (has no administration in time range)     IMPRESSION / MDM / ASSESSMENT AND PLAN / ED COURSE  I reviewed the triage vital signs and the nursing notes.  Patient's presentation is most consistent with acute presentation with potential threat to life or bodily function.  Patient presents to the emergency  department for slurred speech and left facial droop since 2 PM yesterday.  Patient would be outside of the window for any TNK or intervention.  She describes the slurred speech as a recurring issue and has been diagnosed as a mini stroke in the past.  Patient has no focal deficits on exam but does have some mild weakness in her bilateral lower extremities at least decreased effort.  Patient has had some urinary symptoms.  We will check labs, urinalysis and proceed with MR imaging of the brain.  I spoke with Dr. Curly Shores of neurology who agrees with the plan as well as MR imaging apparently.  Given its recurrent nature if MRI is negative patient could likely follow-up outpatient for remainder of work-up.  MRI is positive patient for admission to the hospital for further work-up and treatment.  Patient agreeable to this plan as  well.  Differential would include mini stroke/TIA, CVA, intracranial mass, less likely bleed   MRI without contrast shows possible foci of increased T2 hyperintensity recommend MRI with contrast.  MRI with contrast has resulted showing no significant abnormality.  Remainder the patient's lab work is reassuring, normal urinalysis, normal CBC, reassuring chemistry besides mild hyperglycemia.  Patient will follow-up with Dr.  Patient and family agreeable to plan of care.  FINAL CLINICAL IMPRESSION(S) / ED DIAGNOSES   Slurred speech Transient ischemic attack  Note:  This document was prepared using Dragon voice recognition software and may include unintentional dictation errors.   Harvest Dark, MD 07/13/22 2027

## 2022-07-14 ENCOUNTER — Encounter: Payer: Self-pay | Admitting: Nurse Practitioner

## 2022-07-16 LAB — CULTURE, URINE COMPREHENSIVE

## 2022-07-18 ENCOUNTER — Encounter: Payer: Self-pay | Admitting: Internal Medicine

## 2022-07-18 ENCOUNTER — Telehealth: Payer: Self-pay

## 2022-07-18 ENCOUNTER — Other Ambulatory Visit: Payer: Self-pay | Admitting: Physician Assistant

## 2022-07-18 ENCOUNTER — Ambulatory Visit (INDEPENDENT_AMBULATORY_CARE_PROVIDER_SITE_OTHER): Payer: Medicare Other | Admitting: Internal Medicine

## 2022-07-18 VITALS — BP 129/74 | HR 122 | Temp 98.5°F | Resp 16 | Ht 61.0 in | Wt 167.0 lb

## 2022-07-18 DIAGNOSIS — F332 Major depressive disorder, recurrent severe without psychotic features: Secondary | ICD-10-CM | POA: Diagnosis not present

## 2022-07-18 DIAGNOSIS — G2581 Restless legs syndrome: Secondary | ICD-10-CM | POA: Diagnosis not present

## 2022-07-18 DIAGNOSIS — N3000 Acute cystitis without hematuria: Secondary | ICD-10-CM | POA: Diagnosis not present

## 2022-07-18 DIAGNOSIS — Z8673 Personal history of transient ischemic attack (TIA), and cerebral infarction without residual deficits: Secondary | ICD-10-CM

## 2022-07-18 DIAGNOSIS — G459 Transient cerebral ischemic attack, unspecified: Secondary | ICD-10-CM

## 2022-07-18 MED ORDER — ROPINIROLE HCL 0.25 MG PO TABS: ORAL_TABLET | ORAL | 3 refills | Status: DC

## 2022-07-18 NOTE — Telephone Encounter (Signed)
Lvm to confirm 07/24/22 appointment-Toni

## 2022-07-18 NOTE — Progress Notes (Signed)
[facility] [place], [place] [address]  Internal MEDICINE  Office Visit Note  Patient Name: [name] [name]  [id number]  [id number]  Date of Service: 07/24/2022  Chief Complaint  Patient presents with   Follow-up    Was in ED for stroke-like symptoms, possibly needs neurology referral (pt is very nervous)    HPI   [name] [name] is a 46 y.o. female with a past medical history of anxiety, fibromyalgia, hyperlipidemia, presented to the emergency department for slurred speech and left facial droop.  According to the patient she has been experiencing some dysuria complaining of urinary tract infection, she was seen by her PCP and was started on antibiotics for treatment however and since yesterday she had some difficulty with speech.  With symptoms of slurred speech and facial droop the patient was diagnosed to have a TIA and was recommended to have a follow-up with her PCP She is here with her daughter, worried and tearful her depression is worse since she was in the process of finding a psychiatrist which has been hard She was started on Remeron on previous visit even though she thinks her anxiety is somewhat better however for the last couple of nights she is having difficulty sleeping due to restless legs  For her UTI patient is on Macrobid for cultures and sensitivities  Current Medication: Outpatient Encounter Medications as of 07/18/2022  Medication Sig   ACCU-CHEK AVIVA PLUS test strip Use 1 strip to check glucose level at least once daily E11.65   Accu-Chek Softclix Lancets lancets USE AS INSTRUCTED to check blood sugars once a day   albuterol (VENTOLIN HFA) 108 (90 Base) MCG/ACT inhaler Inhale 1-2 puffs into the lungs every 6 (six) hours as needed for wheezing or shortness of breath.   ALPRAZolam (XANAX) 0.5 MG tablet Take 0.5 tablets (0.25 mg total) by mouth 2 (two) times daily as needed for anxiety.   ammonium lactate (LAC-HYDRIN) 12 % lotion  Apply 1 application topically as needed for dry skin.   Cholecalciferol 25 MCG (1000 UT) tablet Take 1 tablet (1,000 Units total) by mouth once a week.   dapagliflozin propanediol (FARXIGA) 10 MG TABS tablet Take 1 tablet (10 mg total) by mouth daily.   diclofenac Sodium (VOLTAREN) 1 % GEL SMARTSIG:2-4 Gram(s) Topical 1-4 Times Daily PRN   fenofibrate (TRICOR) 145 MG tablet Take by mouth.   FLUoxetine (PROZAC) 40 MG capsule Take 1 capsule (40 mg total) by mouth daily.   fluticasone (FLONASE) 50 MCG/ACT nasal spray Place 2 sprays into both nostrils daily.   HYDROmorphone (DILAUDID) 4 MG tablet Take 4 mg by mouth 4 (four) times daily as needed.   ibuprofen (ADVIL) 800 MG tablet Take 1 tablet (800 mg total) by mouth every 6 (six) hours as needed.   insulin glargine, 2 Unit Dial, (TOUJEO MAX SOLOSTAR) 300 UNIT/ML Solostar Pen Inject 35 Units into the skin daily.   Insulin Pen Needle 31G X 5 MM MISC Use as directed  Once a day Dx e11.65   Ipratropium-Albuterol (COMBIVENT RESPIMAT) 20-100 MCG/ACT AERS respimat Inhale 1 puff into the lungs every 6 (six) hours as needed for wheezing.   ipratropium-albuterol (DUONEB) 0.5-2.5 (3) MG/3ML SOLN Take 3 mLs by nebulization every 4 (four) hours as needed (wheezing,SOB and cough).   levothyroxine (SYNTHROID) 50 MCG tablet TAKE 1 TABLET BY MOUTH EVERY DAY   methocarbamol (ROBAXIN) 500 MG tablet PLEASE SEE ATTACHED FOR DETAILED DIRECTIONS   mirtazapine (REMERON) 15 MG tablet Take  1 tablet (15 mg total) by mouth at bedtime.   morphine (MS CONTIN) 60 MG 12 hr tablet Take 60 mg by mouth every 12 (twelve) hours.   Multiple Vitamin (MULTI-VITAMIN) tablet Take 1 tablet by mouth daily.   nitrofurantoin, macrocrystal-monohydrate, (MACROBID) 100 MG capsule Take 1 tab po bid for 2 weeks and then take tab BID M,W and F until finished   nystatin (MYCOSTATIN/NYSTOP) powder Apply 1 application. topically 3 (three) times daily.   pantoprazole (PROTONIX) 40 MG tablet TAKE 1 TABLET  BY MOUTH EVERY DAY   pravastatin (PRAVACHOL) 10 MG tablet Take 1 tablet (10 mg total) by mouth daily.   pregabalin (LYRICA) 150 MG capsule Take 150 mg by mouth 3 (three) times daily.    rOPINIRole (REQUIP) 0.25 MG tablet Take 1-2 tabs at night for restless legs   Semaglutide, 1 MG/DOSE, 4 MG/3ML SOPN Inject 1 mg as directed once a week.   [EXPIRED] sulfamethoxazole-trimethoprim (BACTRIM DS) 800-160 MG tablet Take 1 tablet by mouth 2 (two) times daily for 7 days.   SUMAtriptan (IMITREX) 100 MG tablet Take 100 mg by mouth every 2 (two) hours as needed for migraine. May repeat in 2 hours if headache persists or recurs.   triamcinolone ointment (KENALOG) 0.1 % Apply to affected area daily for two weeks   Vitamin D, Ergocalciferol, (DRISDOL) 1.25 MG (50000 UNIT) CAPS capsule Take 1 capsule (50,000 Units total) by mouth every 7 (seven) days.   [DISCONTINUED] FLUoxetine (PROZAC) 20 MG capsule TAKE 1 CAPSULE BY MOUTH EVERY DAY   No facility-administered encounter medications on file as of 07/18/2022.    Surgical History: Past Surgical History:  Procedure Laterality Date   ABDOMINAL HYSTERECTOMY     CARPAL TUNNEL RELEASE     FOOT TENDON SURGERY Right    KNEE SURGERY     SPINAL FUSION     SPINAL FUSION      Medical History: Past Medical History:  Diagnosis Date   Actinic keratitis    Allergy    Anxiety    Asthma    Basal cell carcinoma    Chronic venous insufficiency    DDD (degenerative disc disease), lumbar    Diabetes mellitus without complication (HCC)    Fibromyalgia    Fibromyalgia    Hyperlipidemia    Lymphedema    Melanoma ([place])    Migraine    Mitral valve prolapse    Tendonitis of foot    left    Family History: Family History  Problem Relation Age of Onset   Cancer Paternal Aunt    Cancer Paternal Uncle    Diabetes Paternal Grandfather    Heart disease Paternal Grandfather     Social History   Socioeconomic History   Marital status: Divorced    Spouse name:  Not on file   Number of children: Not on file   Years of education: Not on file   Highest education level: Not on file  Occupational History   Not on file  Tobacco Use   Smoking status: Never   Smokeless tobacco: Never  Vaping Use   Vaping Use: Never used  Substance and Sexual Activity   Alcohol use: No   Drug use: No   Sexual activity: Not on file  Other Topics Concern   Not on file  Social History Narrative   Not on file   Social Determinants of Health   Financial Resource Strain: Not on file  Food Insecurity: Not on file  Transportation Needs: Not on  file  Physical Activity: Not on file  Stress: Not on file  Social Connections: Not on file  Intimate Partner Violence: Not on file      Review of Systems  Constitutional:  Negative for chills, fatigue and unexpected weight change.  HENT:  Negative for congestion, postnasal drip, rhinorrhea, sneezing and sore throat.   Eyes:  Negative for redness.  Respiratory:  Positive for shortness of breath. Negative for cough and chest tightness.   Cardiovascular:  Negative for chest pain and palpitations.  Gastrointestinal:  Negative for abdominal pain, constipation, diarrhea, nausea and vomiting.  Genitourinary:  Negative for dysuria and frequency.  Musculoskeletal:  Negative for arthralgias, back pain, joint swelling and neck pain.  Skin:  Negative for rash.  Neurological:  Positive for tremors. Negative for numbness.       Speech difficulty   Hematological:  Negative for adenopathy. Does not bruise/bleed easily.  Psychiatric/Behavioral:  Negative for behavioral problems (Depression), sleep disturbance and suicidal ideas. The patient is not nervous/anxious.     Vital Signs: BP 129/74   Pulse (!) 122   Temp 98.5 F (36.9 C)   Resp 16   Ht '5\' 1"'$  (1.549 m)   Wt 167 lb (75.8 kg)   LMP 12/13/1991   SpO2 93%   BMI 31.55 kg/m    Physical Exam Constitutional:      Appearance: Normal appearance.  HENT:     Head:  Normocephalic and atraumatic.     Nose: Nose normal.     Mouth/Throat:     Mouth: Mucous membranes are moist.     Pharynx: No posterior oropharyngeal erythema.  Eyes:     Extraocular Movements: Extraocular movements intact.     Pupils: Pupils are equal, round, and reactive to light.  Cardiovascular:     Pulses: Normal pulses.     Heart sounds: Normal heart sounds.  Pulmonary:     Effort: Pulmonary effort is normal.     Breath sounds: Normal breath sounds.  Neurological:     Mental Status: She is alert.     Motor: Weakness present.     Coordination: Coordination abnormal.  Psychiatric:        Behavior: Behavior normal.     Comments: Pt is tearful, sad and depressed    Assessment/Plan: 1. TIA (transient ischemic attack) Patient will start taking an aspirin every day until carotid Dopplers are available she might need an echocardiogram we will monitor her symptoms - US Carotid Bilateral; Future  2. acute cystitis without hematuria We will continue on Macrobid as before - CULTURE, URINE COMPREHENSIVE  3. Severe episode of recurrent major depressive disorder, without psychotic features ([place]) Major depression patient is on multiple medications, she says that Remeron did help her however she is having restless legs we will start Requip  4. Secondary restless legs syndrome Suspect due to antidepressant we will start her on low-dose Requip - rOPINIRole (REQUIP) 0.25 MG tablet; Take 1-2 tabs at night for restless legs  Dispense: 60 tablet; Refill: 3   General Counseling: Genie verbalizes understanding of the findings of todays visit and agrees with plan of treatment. I have discussed any further diagnostic evaluation that may be needed or ordered today. We also reviewed her medications today. she has been encouraged to call the office with any questions or concerns that should arise related to todays visit.    Orders Placed This Encounter  Procedures   CULTURE, URINE  COMPREHENSIVE   US Carotid Bilateral    Meds  ordered this encounter  Medications   rOPINIRole (REQUIP) 0.25 MG tablet    Sig: Take 1-2 tabs at night for restless legs    Dispense:  60 tablet    Refill:  3    Total time spent:35 Minutes Time spent includes review of chart, medications, test results, and follow up plan with the patient.   [place] Controlled Substance Database was reviewed by me.   Dr Lavera Guise Internal medicine

## 2022-07-24 ENCOUNTER — Ambulatory Visit: Payer: Medicare Other

## 2022-07-24 DIAGNOSIS — I6529 Occlusion and stenosis of unspecified carotid artery: Secondary | ICD-10-CM

## 2022-07-24 DIAGNOSIS — G459 Transient cerebral ischemic attack, unspecified: Secondary | ICD-10-CM

## 2022-07-26 ENCOUNTER — Encounter: Payer: Self-pay | Admitting: Internal Medicine

## 2022-07-26 ENCOUNTER — Telehealth: Payer: Self-pay

## 2022-07-26 NOTE — Telephone Encounter (Signed)
Pt send my chart message that pt legs are hurting due to mirtazapine she already stopped 4 days ago advised her as per dr Humphrey Rolls  stopped  mirtazapine and requip you can 2 tab at bedtime if you not feeling better call us back or discuss with alyssa next visit

## 2022-07-30 NOTE — Procedures (Signed)
[place], [place] [address]  DATE OF SERVICE: July 24, 2022  CAROTID DOPPLER INTERPRETATION:  Bilateral Carotid Ultrsasound and Color Doppler Examination was performed. The RIGHT CCA shows insignificant plaque in the vessel. The LEFT CCA shows insignificant plaque in the vessel. There was no intimal thickening noted in the RIGHT carotid artery. There was no intimal thickening in the LEFT carotid artery.  The RIGHT CCA shows peak systolic velocity of 75 cm per second. The end diastolic velocity is 18 cm per second on the RIGHT side. The RIGHT ICA shows peak systolic velocity of 83 per second. RIGHT sided ICA end diastolic velocity is 23 cm per second. The RIGHT ECA shows a peak systolic velocity of 74 cm per second. The ICA/CCA ratio is calculated to be 1.1. This suggests less than 50% stenosis. The Vertebral Artery shows antegrade flow.  The LEFT CCA shows peak systolic velocity of 86 cm per second. The end diastolic velocity is 19 cm per second on the LEFT side. The LEFT ICA shows peak systolic velocity of 86 per second. LEFT sided ICA end diastolic velocity is 38 cm per second. The LEFT ECA shows a peak systolic velocity of 83 cm per second. The ICA/CCA ratio is calculated to be 1.0. This suggests less than 50% stenosis. The Vertebral Artery shows antegrade flow.   Impression:    The RIGHT CAROTID shows less than 50% stenosis. The LEFT CAROTID shows less than 50% stenosis.  There is no significant plaque formation noted on the LEFT and no significant plaque on the RIGHT  side. Consider a repeat Carotid doppler if clinical situation and symptoms warrant in 6-12 months. Patient should be encouraged to change lifestyles such as smoking cessation, regular exercise and dietary modification. Use of statins in the right clinical setting and ASA is encouraged.  Allyne Gee, MD [facility] Medicine

## 2022-07-31 ENCOUNTER — Encounter: Payer: Self-pay | Admitting: Nurse Practitioner

## 2022-07-31 ENCOUNTER — Ambulatory Visit (INDEPENDENT_AMBULATORY_CARE_PROVIDER_SITE_OTHER): Payer: Medicare Other | Admitting: Nurse Practitioner

## 2022-07-31 VITALS — BP 128/78 | HR 83 | Temp 98.1°F | Resp 16 | Ht 61.0 in | Wt 166.0 lb

## 2022-07-31 DIAGNOSIS — G459 Transient cerebral ischemic attack, unspecified: Secondary | ICD-10-CM

## 2022-07-31 DIAGNOSIS — E1165 Type 2 diabetes mellitus with hyperglycemia: Secondary | ICD-10-CM | POA: Diagnosis not present

## 2022-07-31 DIAGNOSIS — R9089 Other abnormal findings on diagnostic imaging of central nervous system: Secondary | ICD-10-CM

## 2022-07-31 DIAGNOSIS — E559 Vitamin D deficiency, unspecified: Secondary | ICD-10-CM

## 2022-07-31 MED ORDER — BUSPIRONE HCL 7.5 MG PO TABS: 7.5000 mg | ORAL_TABLET | Freq: Two times a day (BID) | ORAL | 2 refills | Status: DC

## 2022-07-31 MED ORDER — POLYETHYLENE GLYCOL 3350 17 G PO PACK: 17.0000 g | PACK | Freq: Every day | ORAL | 2 refills | Status: DC | PRN

## 2022-07-31 MED ORDER — SEMAGLUTIDE (1 MG/DOSE) 4 MG/3ML ~~LOC~~ SOPN: 1.0000 mg | PEN_INJECTOR | SUBCUTANEOUS | 1 refills | Status: DC

## 2022-07-31 MED ORDER — DAPAGLIFLOZIN PROPANEDIOL 10 MG PO TABS: 10.0000 mg | ORAL_TABLET | Freq: Every day | ORAL | 1 refills | Status: DC

## 2022-07-31 MED ORDER — VITAMIN D (ERGOCALCIFEROL) 1.25 MG (50000 UNIT) PO CAPS: 50000.0000 [IU] | ORAL_CAPSULE | ORAL | 1 refills | Status: AC

## 2022-07-31 NOTE — Progress Notes (Signed)
[facility],  [address]  Internal MEDICINE  Office Visit Note  Patient Name: [name] [name]  [id number]  [id number]  Date of Service: 07/31/2022  Chief Complaint  Patient presents with   Follow-up   Diabetes   Hyperlipidemia    HPI [name] [name] presents for a follow up visit for TIA, depression and anxiety.  neurosurgery consult eval for MS? UTI was treated, but pain in lower back has started again.  Mirtazapine and ropinirole are no longer helping and are making her feel nauseated and causing her to gain weight. She is also not taking the alprazolam either.  Still would like to take something for anxiety, just not alprazolam  Current Medication: Outpatient Encounter Medications as of 07/31/2022  Medication Sig   ammonium lactate (LAC-HYDRIN) 12 % lotion Apply 1 application topically as needed for dry skin.   Cholecalciferol 25 MCG (1000 UT) tablet Take 1 tablet (1,000 Units total) by mouth once a week.   diclofenac Sodium (VOLTAREN) 1 % GEL SMARTSIG:2-4 Gram(s) Topical 1-4 Times Daily PRN   HYDROmorphone (DILAUDID) 4 MG tablet Take 4 mg by mouth 4 (four) times daily as needed.   ibuprofen (ADVIL) 800 MG tablet Take 1 tablet (800 mg total) by mouth every 6 (six) hours as needed.   insulin glargine, 2 Unit Dial, (TOUJEO MAX SOLOSTAR) 300 UNIT/ML Solostar Pen Inject 35 Units into the skin daily.   Insulin Pen Needle 31G X 5 MM MISC Use as directed  Once a day Dx e11.65   methocarbamol (ROBAXIN) 500 MG tablet PLEASE SEE ATTACHED FOR DETAILED DIRECTIONS   morphine (MS CONTIN) 60 MG 12 hr tablet Take 60 mg by mouth every 12 (twelve) hours.   Multiple Vitamin (MULTI-VITAMIN) tablet Take 1 tablet by mouth daily.   nystatin (MYCOSTATIN/NYSTOP) powder Apply 1 application. topically 3 (three) times daily.   pantoprazole (PROTONIX) 40 MG tablet TAKE 1 TABLET BY MOUTH EVERY DAY   pregabalin (LYRICA) 150 MG capsule Take 150 mg by mouth 3 (three) times  daily.    [DISCONTINUED] ACCU-CHEK AVIVA PLUS test strip Use 1 strip to check glucose level at least once daily E11.65   [DISCONTINUED] Accu-Chek Softclix Lancets lancets USE AS INSTRUCTED to check blood sugars once a day   [DISCONTINUED] albuterol (VENTOLIN HFA) 108 (90 Base) MCG/ACT inhaler Inhale 1-2 puffs into the lungs every 6 (six) hours as needed for wheezing or shortness of breath.   [DISCONTINUED] ALPRAZolam (XANAX) 0.5 MG tablet Take 0.5 tablets (0.25 mg total) by mouth 2 (two) times daily as needed for anxiety.   [DISCONTINUED] busPIRone (BUSPAR) 7.5 MG tablet Take 1 tablet (7.5 mg total) by mouth 2 (two) times daily.   [DISCONTINUED] dapagliflozin propanediol (FARXIGA) 10 MG TABS tablet Take 1 tablet (10 mg total) by mouth daily.   [DISCONTINUED] fenofibrate (TRICOR) 145 MG tablet Take by mouth.   [DISCONTINUED] FLUoxetine (PROZAC) 40 MG capsule Take 1 capsule (40 mg total) by mouth daily.   [DISCONTINUED] fluticasone (FLONASE) 50 MCG/ACT nasal spray Place 2 sprays into both nostrils daily.   [DISCONTINUED] Ipratropium-Albuterol (COMBIVENT RESPIMAT) 20-100 MCG/ACT AERS respimat Inhale 1 puff into the lungs every 6 (six) hours as needed for wheezing.   [DISCONTINUED] ipratropium-albuterol (DUONEB) 0.5-2.5 (3) MG/3ML SOLN Take 3 mLs by nebulization every 4 (four) hours as needed (wheezing,SOB and cough).   [DISCONTINUED] levothyroxine (SYNTHROID) 50 MCG tablet TAKE 1 TABLET BY MOUTH EVERY DAY   [DISCONTINUED] mirtazapine (REMERON) 15 MG tablet Take 1 tablet (15 mg  total) by mouth at bedtime.   [DISCONTINUED] nitrofurantoin, macrocrystal-monohydrate, (MACROBID) 100 MG capsule Take 1 tab po bid for 2 weeks and then take tab BID M,W and F until finished   [DISCONTINUED] polyethylene glycol (MIRALAX / GLYCOLAX) 17 g packet Take 17 g by mouth daily as needed for mild constipation or moderate constipation.   [DISCONTINUED] pravastatin (PRAVACHOL) 10 MG tablet Take 1 tablet (10 mg total) by mouth  daily.   [DISCONTINUED] rOPINIRole (REQUIP) 0.25 MG tablet Take 1-2 tabs at night for restless legs   [DISCONTINUED] Semaglutide, 1 MG/DOSE, 4 MG/3ML SOPN Inject 1 mg as directed once a week.   [DISCONTINUED] SUMAtriptan (IMITREX) 100 MG tablet Take 100 mg by mouth every 2 (two) hours as needed for migraine. May repeat in 2 hours if headache persists or recurs.   [DISCONTINUED] triamcinolone ointment (KENALOG) 0.1 % Apply to affected area daily for two weeks   [DISCONTINUED] Vitamin D, Ergocalciferol, (DRISDOL) 1.25 MG (50000 UNIT) CAPS capsule Take 1 capsule (50,000 Units total) by mouth every 7 (seven) days.   Vitamin D, Ergocalciferol, (DRISDOL) 1.25 MG (50000 UNIT) CAPS capsule Take 1 capsule (50,000 Units total) by mouth every 7 (seven) days.   [DISCONTINUED] dapagliflozin propanediol (FARXIGA) 10 MG TABS tablet Take 1 tablet (10 mg total) by mouth daily.   [DISCONTINUED] Semaglutide, 1 MG/DOSE, 4 MG/3ML SOPN Inject 1 mg as directed once a week.   No facility-administered encounter medications on file as of 07/31/2022.    Surgical History: Past Surgical History:  Procedure Laterality Date   ABDOMINAL HYSTERECTOMY     CARPAL TUNNEL RELEASE     FOOT TENDON SURGERY Right    KNEE SURGERY     SPINAL FUSION     SPINAL FUSION      Medical History: Past Medical History:  Diagnosis Date   Actinic keratitis    Allergy    Anxiety    Asthma    Basal cell carcinoma    Chronic venous insufficiency    DDD (degenerative disc disease), lumbar    Diabetes mellitus without complication (HCC)    Fibromyalgia    Fibromyalgia    Hyperlipidemia    Lymphedema    Melanoma ([place])    Migraine    Mitral valve prolapse    Tendonitis of foot    left    Family History: Family History  Problem Relation Age of Onset   Cancer Paternal Aunt    Cancer Paternal Uncle    Diabetes Paternal Grandfather    Heart disease Paternal Grandfather     Social History   Socioeconomic History   Marital  status: Divorced    Spouse name: Not on file   Number of children: Not on file   Years of education: Not on file   Highest education level: Not on file  Occupational History   Not on file  Tobacco Use   Smoking status: Never   Smokeless tobacco: Never  Vaping Use   Vaping Use: Never used  Substance and Sexual Activity   Alcohol use: No   Drug use: No   Sexual activity: Not on file  Other Topics Concern   Not on file  Social History Narrative   Not on file   Social Determinants of Health   Financial Resource Strain: Not on file  Food Insecurity: Not on file  Transportation Needs: Not on file  Physical Activity: Not on file  Stress: Not on file  Social Connections: Not on file  Intimate Partner Violence: Not  on file      Review of Systems  Constitutional: Negative.  Negative for chills, fatigue and unexpected weight change.  HENT: Negative.  Negative for congestion, postnasal drip, rhinorrhea, sneezing and sore throat.   Eyes:  Negative for redness.  Respiratory: Negative.  Negative for cough, chest tightness, shortness of breath and wheezing.   Cardiovascular: Negative.  Negative for chest pain and palpitations.  Gastrointestinal: Negative.  Negative for abdominal pain, constipation, diarrhea, nausea and vomiting.  Genitourinary:  Positive for frequency. Negative for dysuria.  Musculoskeletal:  Positive for back pain. Negative for arthralgias, joint swelling and neck pain.  Skin:  Negative for rash.  Neurological:  Positive for tremors. Negative for numbness.       Speech difficulty   Hematological:  Negative for adenopathy. Does not bruise/bleed easily.  Psychiatric/Behavioral:  Negative for behavioral problems (Depression), sleep disturbance and suicidal ideas. The patient is not nervous/anxious.     Vital Signs: BP 128/78   Pulse 83   Temp 98.1 F (36.7 C)   Resp 16   Ht '5\' 1"'$  (1.914 m)   Wt 166 lb (75.3 kg)   LMP 12/13/1991   SpO2 93%   BMI 31.37 kg/m     Physical Exam Vitals reviewed.  Constitutional:      General: She is not in acute distress.    Appearance: Normal appearance. She is obese. She is not ill-appearing.  HENT:     Head: Normocephalic and atraumatic.  Eyes:     Pupils: Pupils are equal, round, and reactive to light.  Cardiovascular:     Rate and Rhythm: Normal rate and regular rhythm.  Pulmonary:     Effort: Pulmonary effort is normal. No respiratory distress.  Neurological:     Mental Status: She is alert and oriented to person, place, and time.  Psychiatric:        Mood and Affect: Mood normal.        Behavior: Behavior normal.        Assessment/Plan: 1. TIA (transient ischemic attack) Referral ordered per instructions from emergency MD per patient  - Ambulatory referral to Neurosurgery  2. Abnormal finding on MRI of brain See problem #1 - Ambulatory referral to Neurosurgery  3. Uncontrolled type 2 diabetes mellitus with hyperglycemia ([place]) Continue farxiga as prescribed. Ozempic dose increased  4. Vitamin D deficiency Continue vitamin D supplement - Vitamin D, Ergocalciferol, (DRISDOL) 1.25 MG (50000 UNIT) CAPS capsule; Take 1 capsule (50,000 Units total) by mouth every 7 (seven) days.  Dispense: 15 capsule; Refill: 1   General Counseling: Anhar verbalizes understanding of the findings of todays visit and agrees with plan of treatment. I have discussed any further diagnostic evaluation that may be needed or ordered today. We also reviewed her medications today. she has been encouraged to call the office with any questions or concerns that should arise related to todays visit.    Orders Placed This Encounter  Procedures   Ambulatory referral to Neurosurgery    Meds ordered this encounter  Medications   DISCONTD: busPIRone (BUSPAR) 7.5 MG tablet    Sig: Take 1 tablet (7.5 mg total) by mouth 2 (two) times daily.    Dispense:  60 tablet    Refill:  2   DISCONTD: Semaglutide, 1 MG/DOSE, 4  MG/3ML SOPN    Sig: Inject 1 mg as directed once a week.    Dispense:  3 mL    Refill:  1   Vitamin D, Ergocalciferol, (DRISDOL) 1.25 MG (50000  UNIT) CAPS capsule    Sig: Take 1 capsule (50,000 Units total) by mouth every 7 (seven) days.    Dispense:  15 capsule    Refill:  1   DISCONTD: dapagliflozin propanediol (FARXIGA) 10 MG TABS tablet    Sig: Take 1 tablet (10 mg total) by mouth daily.    Dispense:  90 tablet    Refill:  1   DISCONTD: polyethylene glycol (MIRALAX / GLYCOLAX) 17 g packet    Sig: Take 17 g by mouth daily as needed for mild constipation or moderate constipation.    Dispense:  30 each    Refill:  2   Return in about 4 weeks (around 08/28/2022) for F/U, eval new med, [place] PCP.   Total time spent:30 Minutes Time spent includes review of chart, medications, test results, and follow up plan with the patient.   [place] Controlled Substance Database was reviewed by me.  This patient was seen by Jonetta Osgood, FNP-C in collaboration with Dr. Clayborn Bigness as a part of collaborative care agreement.   Jonetta Osgood, MSN, FNP-C Internal medicine

## 2022-08-01 DIAGNOSIS — Z79899 Other long term (current) drug therapy: Secondary | ICD-10-CM | POA: Diagnosis not present

## 2022-08-01 DIAGNOSIS — M62838 Other muscle spasm: Secondary | ICD-10-CM | POA: Diagnosis not present

## 2022-08-01 DIAGNOSIS — G8929 Other chronic pain: Secondary | ICD-10-CM | POA: Diagnosis not present

## 2022-08-01 DIAGNOSIS — M25562 Pain in left knee: Secondary | ICD-10-CM | POA: Diagnosis not present

## 2022-08-01 DIAGNOSIS — M797 Fibromyalgia: Secondary | ICD-10-CM | POA: Diagnosis not present

## 2022-08-01 DIAGNOSIS — M545 Low back pain, unspecified: Secondary | ICD-10-CM | POA: Diagnosis not present

## 2022-08-04 ENCOUNTER — Telehealth: Payer: Self-pay

## 2022-08-09 ENCOUNTER — Telehealth: Payer: Self-pay

## 2022-08-09 NOTE — Telephone Encounter (Signed)
Neurosurgery referral sent via [employer] to Dr. Ashok Pall w/ [facility] in [facility]

## 2022-08-11 NOTE — Telephone Encounter (Signed)
Error

## 2022-08-15 ENCOUNTER — Telehealth: Payer: Self-pay

## 2022-08-15 NOTE — Patient Outreach (Signed)
  Care Coordination   08/15/2022 Name: [name] [name] MRN: [id number] DOB: 11/23/1976   Care Coordination Outreach Attempts:  An unsuccessful telephone outreach was attempted today to offer the patient information about available care coordination services as a benefit of their health plan.   Follow Up Plan:  Additional outreach attempts will be made to offer the patient care coordination information and services.   Encounter Outcome:  No Answer  Care Coordination Interventions Activated:  No   Care Coordination Interventions:  No, not indicated     Enzo Montgomery, RN,BSN,CCM [place] [facility] Telephonic Care Management Coordinator Direct Phone: [phone] Toll Free: [phone] Fax: [phone]

## 2022-08-16 ENCOUNTER — Telehealth: Payer: Self-pay | Admitting: Nurse Practitioner

## 2022-08-16 NOTE — Telephone Encounter (Signed)
Neurology appointment 08/28/22 with [facility] - Sallyanne Kuster

## 2022-08-18 ENCOUNTER — Telehealth: Payer: Self-pay

## 2022-08-18 NOTE — Patient Outreach (Signed)
  Care Coordination   08/18/2022 Name: ZONNIQUE NORKUS MRN: [id number] DOB: 01-Dec-1976   Care Coordination Outreach Attempts:  An unsuccessful telephone outreach was attempted today to offer the patient information about available care coordination services as a benefit of their health plan.   Follow Up Plan:  Additional outreach attempts will be made to offer the patient care coordination information and services.   Encounter Outcome:  No Answer  Care Coordination Interventions Activated:  No   Care Coordination Interventions:  No, not indicated      Enzo Montgomery, RN,BSN,CCM [place] [facility] Telephonic Care Management Coordinator Direct Phone: [phone] Toll Free: [phone] Fax: [phone]

## 2022-08-22 ENCOUNTER — Ambulatory Visit: Payer: Medicare Other | Admitting: Nurse Practitioner

## 2022-08-23 ENCOUNTER — Telehealth: Payer: Self-pay

## 2022-08-23 NOTE — Patient Outreach (Signed)
  Care Coordination   08/23/2022 Name: [name] [name] MRN: [id number] DOB: [birth year]   Care Coordination Outreach Attempts:  A third unsuccessful outreach was attempted today to offer the patient with information about available care coordination services as a benefit of their health plan.   Follow Up Plan:  No further outreach attempts will be made at this time. We have been unable to contact the patient to offer or enroll patient in care coordination services  Encounter Outcome:  No Answer  Care Coordination Interventions Activated:  No   Care Coordination Interventions:  No, not indicated      Enzo Montgomery, RN,BSN,CCM [place] [facility] Telephonic Care Management Coordinator Direct Phone: [phone] Toll Free: [phone] Fax: [phone]

## 2022-08-27 ENCOUNTER — Encounter: Payer: Self-pay | Admitting: Nurse Practitioner

## 2022-08-31 DIAGNOSIS — M545 Low back pain, unspecified: Secondary | ICD-10-CM | POA: Diagnosis not present

## 2022-08-31 DIAGNOSIS — M797 Fibromyalgia: Secondary | ICD-10-CM | POA: Diagnosis not present

## 2022-08-31 DIAGNOSIS — G894 Chronic pain syndrome: Secondary | ICD-10-CM | POA: Diagnosis not present

## 2022-08-31 DIAGNOSIS — G8929 Other chronic pain: Secondary | ICD-10-CM | POA: Diagnosis not present

## 2022-08-31 DIAGNOSIS — M62838 Other muscle spasm: Secondary | ICD-10-CM | POA: Diagnosis not present

## 2022-08-31 DIAGNOSIS — M25562 Pain in left knee: Secondary | ICD-10-CM | POA: Diagnosis not present

## 2022-09-01 ENCOUNTER — Ambulatory Visit: Payer: Medicare Other | Admitting: Nurse Practitioner

## 2022-09-03 ENCOUNTER — Other Ambulatory Visit: Payer: Self-pay | Admitting: Internal Medicine

## 2022-09-03 DIAGNOSIS — E1165 Type 2 diabetes mellitus with hyperglycemia: Secondary | ICD-10-CM

## 2022-09-04 ENCOUNTER — Other Ambulatory Visit: Payer: Self-pay | Admitting: Internal Medicine

## 2022-09-04 DIAGNOSIS — E1165 Type 2 diabetes mellitus with hyperglycemia: Secondary | ICD-10-CM

## 2022-09-04 DIAGNOSIS — G459 Transient cerebral ischemic attack, unspecified: Secondary | ICD-10-CM | POA: Diagnosis not present

## 2022-09-08 ENCOUNTER — Encounter: Payer: Self-pay | Admitting: Nurse Practitioner

## 2022-09-08 ENCOUNTER — Ambulatory Visit (INDEPENDENT_AMBULATORY_CARE_PROVIDER_SITE_OTHER): Payer: Medicare Other | Admitting: Nurse Practitioner

## 2022-09-08 VITALS — BP 123/79 | HR 85 | Temp 96.8°F | Resp 16 | Ht 61.0 in | Wt 165.4 lb

## 2022-09-08 DIAGNOSIS — F411 Generalized anxiety disorder: Secondary | ICD-10-CM

## 2022-09-08 DIAGNOSIS — G4709 Other insomnia: Secondary | ICD-10-CM | POA: Diagnosis not present

## 2022-09-08 DIAGNOSIS — E1165 Type 2 diabetes mellitus with hyperglycemia: Secondary | ICD-10-CM | POA: Diagnosis not present

## 2022-09-08 DIAGNOSIS — Z760 Encounter for issue of repeat prescription: Secondary | ICD-10-CM

## 2022-09-08 DIAGNOSIS — E039 Hypothyroidism, unspecified: Secondary | ICD-10-CM

## 2022-09-08 DIAGNOSIS — F16283 Hallucinogen dependence with hallucinogen persisting perception disorder (flashbacks): Secondary | ICD-10-CM | POA: Diagnosis not present

## 2022-09-08 DIAGNOSIS — E782 Mixed hyperlipidemia: Secondary | ICD-10-CM

## 2022-09-08 DIAGNOSIS — F332 Major depressive disorder, recurrent severe without psychotic features: Secondary | ICD-10-CM

## 2022-09-08 DIAGNOSIS — G2581 Restless legs syndrome: Secondary | ICD-10-CM

## 2022-09-08 DIAGNOSIS — J449 Chronic obstructive pulmonary disease, unspecified: Secondary | ICD-10-CM

## 2022-09-08 MED ORDER — FLUOXETINE HCL 40 MG PO CAPS
40.0000 mg | ORAL_CAPSULE | Freq: Every day | ORAL | 1 refills | Status: DC
Start: 2022-09-08 — End: 2022-12-25

## 2022-09-08 MED ORDER — ONETOUCH DELICA PLUS LANCET33G MISC: 3 refills | Status: DC

## 2022-09-08 MED ORDER — DAPAGLIFLOZIN PROPANEDIOL 10 MG PO TABS: 10.0000 mg | ORAL_TABLET | Freq: Every day | ORAL | 1 refills | Status: DC

## 2022-09-08 MED ORDER — ROPINIROLE HCL 0.25 MG PO TABS
ORAL_TABLET | ORAL | 3 refills | Status: DC
Start: 2022-09-08 — End: 2023-06-05

## 2022-09-08 MED ORDER — COMBIVENT RESPIMAT 20-100 MCG/ACT IN AERS: 1.0000 | INHALATION_SPRAY | Freq: Four times a day (QID) | RESPIRATORY_TRACT | 1 refills | Status: DC | PRN

## 2022-09-08 MED ORDER — FLUTICASONE PROPIONATE 50 MCG/ACT NA SUSP: 2.0000 | Freq: Every day | NASAL | 5 refills | Status: DC

## 2022-09-08 MED ORDER — SEMAGLUTIDE (2 MG/DOSE) 8 MG/3ML ~~LOC~~ SOPN: 2.0000 mg | PEN_INJECTOR | SUBCUTANEOUS | 2 refills | Status: DC

## 2022-09-08 MED ORDER — ONETOUCH VERIO VI STRP: ORAL_STRIP | 12 refills | Status: DC

## 2022-09-08 MED ORDER — ALBUTEROL SULFATE HFA 108 (90 BASE) MCG/ACT IN AERS: 1.0000 | INHALATION_SPRAY | Freq: Four times a day (QID) | RESPIRATORY_TRACT | 1 refills | Status: DC | PRN

## 2022-09-08 MED ORDER — PRAVASTATIN SODIUM 10 MG PO TABS: 10.0000 mg | ORAL_TABLET | Freq: Every day | ORAL | 2 refills | Status: DC

## 2022-09-08 MED ORDER — LEVOTHYROXINE SODIUM 50 MCG PO TABS
50.0000 ug | ORAL_TABLET | Freq: Every day | ORAL | 1 refills | Status: DC
Start: 2022-09-08 — End: 2022-10-17

## 2022-09-08 MED ORDER — TRAZODONE HCL 50 MG PO TABS: 25.0000 mg | ORAL_TABLET | Freq: Every evening | ORAL | 3 refills | Status: DC | PRN

## 2022-09-08 NOTE — Progress Notes (Unsigned)
[facility] [place], [place] [address]  Internal MEDICINE  Office Visit Note  Patient Name: [name] [name]  [id number]  [id number]  Date of Service: 09/08/2022  Chief Complaint  Patient presents with   Follow-up   Hyperlipidemia   Diabetes    HPI [name] presents for a follow-up visit for  Buspirone caused leg pain, discontinue     Current Medication: Outpatient Encounter Medications as of 09/08/2022  Medication Sig   ammonium lactate (LAC-HYDRIN) 12 % lotion Apply 1 application topically as needed for dry skin.   Cholecalciferol 25 MCG (1000 UT) tablet Take 1 tablet (1,000 Units total) by mouth once a week.   diclofenac Sodium (VOLTAREN) 1 % GEL SMARTSIG:2-4 Gram(s) Topical 1-4 Times Daily PRN   glucose blood (ONETOUCH VERIO) test strip Use 1 test strip to check glucose level three times daily and prn   HYDROmorphone (DILAUDID) 4 MG tablet Take 4 mg by mouth 4 (four) times daily as needed.   ibuprofen (ADVIL) 800 MG tablet Take 1 tablet (800 mg total) by mouth every 6 (six) hours as needed.   insulin glargine, 2 Unit Dial, (TOUJEO MAX SOLOSTAR) 300 UNIT/ML Solostar Pen Inject 35 Units into the skin daily.   Insulin Pen Needle 31G X 5 MM MISC Use as directed  Once a day Dx e11.65   Lancets (ONETOUCH DELICA PLUS EHUDJS97W) MISC Use 1 lancet to check glucose three times daily and prn   methocarbamol (ROBAXIN) 500 MG tablet PLEASE SEE ATTACHED FOR DETAILED DIRECTIONS   morphine (MS CONTIN) 60 MG 12 hr tablet Take 60 mg by mouth every 12 (twelve) hours.   Multiple Vitamin (MULTI-VITAMIN) tablet Take 1 tablet by mouth daily.   nystatin (MYCOSTATIN/NYSTOP) powder Apply 1 application. topically 3 (three) times daily.   pantoprazole (PROTONIX) 40 MG tablet TAKE 1 TABLET BY MOUTH EVERY DAY   pregabalin (LYRICA) 150 MG capsule Take 150 mg by mouth 3 (three) times daily.    Semaglutide, 2 MG/DOSE, 8 MG/3ML SOPN Inject 2 mg as directed once a week.    traZODone (DESYREL) 50 MG tablet Take 0.5-1 tablets (25-50 mg total) by mouth at bedtime as needed for sleep.   Vitamin D, Ergocalciferol, (DRISDOL) 1.25 MG (50000 UNIT) CAPS capsule Take 1 capsule (50,000 Units total) by mouth every 7 (seven) days.   [DISCONTINUED] ACCU-CHEK AVIVA PLUS test strip Use 1 strip to check glucose level at least once daily E11.65   [DISCONTINUED] Accu-Chek Softclix Lancets lancets USE AS INSTRUCTED to check blood sugars once a day   [DISCONTINUED] albuterol (VENTOLIN HFA) 108 (90 Base) MCG/ACT inhaler Inhale 1-2 puffs into the lungs every 6 (six) hours as needed for wheezing or shortness of breath.   [DISCONTINUED] busPIRone (BUSPAR) 7.5 MG tablet Take 1 tablet (7.5 mg total) by mouth 2 (two) times daily.   [DISCONTINUED] dapagliflozin propanediol (FARXIGA) 10 MG TABS tablet Take 1 tablet (10 mg total) by mouth daily.   [DISCONTINUED] FLUoxetine (PROZAC) 40 MG capsule Take 1 capsule (40 mg total) by mouth daily.   [DISCONTINUED] fluticasone (FLONASE) 50 MCG/ACT nasal spray Place 2 sprays into both nostrils daily.   [DISCONTINUED] Ipratropium-Albuterol (COMBIVENT RESPIMAT) 20-100 MCG/ACT AERS respimat Inhale 1 puff into the lungs every 6 (six) hours as needed for wheezing.   [DISCONTINUED] levothyroxine (SYNTHROID) 50 MCG tablet TAKE 1 TABLET BY MOUTH EVERY DAY   [DISCONTINUED] OZEMPIC, 1 MG/DOSE, 4 MG/3ML SOPN INJECT 1 MG AS DIRECTED ONCE A WEEK   [DISCONTINUED] polyethylene glycol (MIRALAX /  GLYCOLAX) 17 g packet Take 17 g by mouth daily as needed for mild constipation or moderate constipation.   [DISCONTINUED] pravastatin (PRAVACHOL) 10 MG tablet Take 1 tablet (10 mg total) by mouth daily.   [DISCONTINUED] rOPINIRole (REQUIP) 0.25 MG tablet Take 1-2 tabs at night for restless legs   [DISCONTINUED] triamcinolone ointment (KENALOG) 0.1 % Apply to affected area daily for two weeks   albuterol (VENTOLIN HFA) 108 (90 Base) MCG/ACT inhaler Inhale 1-2 puffs into the lungs  every 6 (six) hours as needed for wheezing or shortness of breath.   dapagliflozin propanediol (FARXIGA) 10 MG TABS tablet Take 1 tablet (10 mg total) by mouth daily.   FLUoxetine (PROZAC) 40 MG capsule Take 1 capsule (40 mg total) by mouth daily.   fluticasone (FLONASE) 50 MCG/ACT nasal spray Place 2 sprays into both nostrils daily.   Ipratropium-Albuterol (COMBIVENT RESPIMAT) 20-100 MCG/ACT AERS respimat Inhale 1 puff into the lungs every 6 (six) hours as needed for wheezing.   levothyroxine (SYNTHROID) 50 MCG tablet Take 1 tablet (50 mcg total) by mouth daily.   pravastatin (PRAVACHOL) 10 MG tablet Take 1 tablet (10 mg total) by mouth daily.   rOPINIRole (REQUIP) 0.25 MG tablet Take 1-2 tabs at night for restless legs   No facility-administered encounter medications on file as of 09/08/2022.    Surgical History: Past Surgical History:  Procedure Laterality Date   ABDOMINAL HYSTERECTOMY     CARPAL TUNNEL RELEASE     FOOT TENDON SURGERY Right    KNEE SURGERY     SPINAL FUSION     SPINAL FUSION      Medical History: Past Medical History:  Diagnosis Date   Actinic keratitis    Allergy    Anxiety    Asthma    Basal cell carcinoma    Chronic venous insufficiency    DDD (degenerative disc disease), lumbar    Diabetes mellitus without complication (HCC)    Fibromyalgia    Fibromyalgia    Hyperlipidemia    Lymphedema    Melanoma ([place])    Migraine    Mitral valve prolapse    Tendonitis of foot    left    Family History: Family History  Problem Relation Age of Onset   Cancer Paternal Aunt    Cancer Paternal Uncle    Diabetes Paternal Grandfather    Heart disease Paternal Grandfather     Social History   Socioeconomic History   Marital status: Divorced    Spouse name: Not on file   Number of children: Not on file   Years of education: Not on file   Highest education level: Not on file  Occupational History   Not on file  Tobacco Use   Smoking status: Never    Smokeless tobacco: Never  Vaping Use   Vaping Use: Never used  Substance and Sexual Activity   Alcohol use: No   Drug use: No   Sexual activity: Not on file  Other Topics Concern   Not on file  Social History Narrative   Not on file   Social Determinants of Health   Financial Resource Strain: Not on file  Food Insecurity: Not on file  Transportation Needs: Not on file  Physical Activity: Not on file  Stress: Not on file  Social Connections: Not on file  Intimate Partner Violence: Not on file      Review of Systems  Vital Signs: BP 123/79   Pulse 85   Temp (!) 96.8 F (  36 C)   Resp 16   Ht '5\' 1"'$  (1.549 m)   Wt 165 lb 6.4 oz (75 kg)   LMP 12/13/1991   SpO2 95%   BMI 31.25 kg/m    Physical Exam     Assessment/Plan:   General Counseling: Marlaysia verbalizes understanding of the findings of todays visit and agrees with plan of treatment. I have discussed any further diagnostic evaluation that may be needed or ordered today. We also reviewed her medications today. she has been encouraged to call the office with any questions or concerns that should arise related to todays visit.    No orders of the defined types were placed in this encounter.   Meds ordered this encounter  Medications   traZODone (DESYREL) 50 MG tablet    Sig: Take 0.5-1 tablets (25-50 mg total) by mouth at bedtime as needed for sleep.    Dispense:  30 tablet    Refill:  3   albuterol (VENTOLIN HFA) 108 (90 Base) MCG/ACT inhaler    Sig: Inhale 1-2 puffs into the lungs every 6 (six) hours as needed for wheezing or shortness of breath.    Dispense:  18 g    Refill:  1   dapagliflozin propanediol (FARXIGA) 10 MG TABS tablet    Sig: Take 1 tablet (10 mg total) by mouth daily.    Dispense:  90 tablet    Refill:  1   FLUoxetine (PROZAC) 40 MG capsule    Sig: Take 1 capsule (40 mg total) by mouth daily.    Dispense:  90 capsule    Refill:  1   fluticasone (FLONASE) 50 MCG/ACT nasal spray     Sig: Place 2 sprays into both nostrils daily.    Dispense:  16 g    Refill:  5   Ipratropium-Albuterol (COMBIVENT RESPIMAT) 20-100 MCG/ACT AERS respimat    Sig: Inhale 1 puff into the lungs every 6 (six) hours as needed for wheezing.    Dispense:  4 g    Refill:  1   levothyroxine (SYNTHROID) 50 MCG tablet    Sig: Take 1 tablet (50 mcg total) by mouth daily.    Dispense:  90 tablet    Refill:  1   Semaglutide, 2 MG/DOSE, 8 MG/3ML SOPN    Sig: Inject 2 mg as directed once a week.    Dispense:  3 mL    Refill:  2    Please note increased dose, discontinue previous orders for ozempic.   pravastatin (PRAVACHOL) 10 MG tablet    Sig: Take 1 tablet (10 mg total) by mouth daily.    Dispense:  90 tablet    Refill:  2   rOPINIRole (REQUIP) 0.25 MG tablet    Sig: Take 1-2 tabs at night for restless legs    Dispense:  180 tablet    Refill:  3    Please fill as 90 day supply   Lancets (ONETOUCH DELICA PLUS [id number]) MISC    Sig: Use 1 lancet to check glucose three times daily and prn    Dispense:  300 each    Refill:  3    Dx code E11.65; please discontinue previous orders for lancets   glucose blood (ONETOUCH VERIO) test strip    Sig: Use 1 test strip to check glucose level three times daily and prn    Dispense:  300 each    Refill:  12    Dx code E11.65; New test strips, discontinue previous ordered  for test strips    Return in about 6 weeks (around 10/20/2022) for F/U, eval new med, [place] PCP.   Total time spent:*** Minutes Time spent includes review of chart, medications, test results, and follow up plan with the patient.   [place] Controlled Substance Database was reviewed by me.  This patient was seen by Jonetta Osgood, FNP-C in collaboration with Dr. Clayborn Bigness as a part of collaborative care agreement.   Cecilie Heidel R. Valetta Fuller, MSN, FNP-C Internal medicine

## 2022-09-09 ENCOUNTER — Other Ambulatory Visit: Payer: Self-pay | Admitting: Nurse Practitioner

## 2022-09-09 ENCOUNTER — Encounter: Payer: Self-pay | Admitting: Nurse Practitioner

## 2022-09-09 DIAGNOSIS — G4709 Other insomnia: Secondary | ICD-10-CM

## 2022-09-09 DIAGNOSIS — F16283 Hallucinogen dependence with hallucinogen persisting perception disorder (flashbacks): Secondary | ICD-10-CM

## 2022-09-09 MED ORDER — PRAZOSIN HCL 1 MG PO CAPS: 1.0000 mg | ORAL_CAPSULE | Freq: Every day | ORAL | 1 refills | Status: DC

## 2022-09-21 ENCOUNTER — Encounter: Payer: Self-pay | Admitting: Neurology

## 2022-09-21 ENCOUNTER — Ambulatory Visit: Payer: Medicare Other | Admitting: Neurology

## 2022-09-21 VITALS — BP 105/64 | HR 77 | Ht 61.0 in | Wt 165.0 lb

## 2022-09-21 DIAGNOSIS — G459 Transient cerebral ischemic attack, unspecified: Secondary | ICD-10-CM

## 2022-09-21 DIAGNOSIS — R269 Unspecified abnormalities of gait and mobility: Secondary | ICD-10-CM | POA: Diagnosis not present

## 2022-09-21 DIAGNOSIS — R200 Anesthesia of skin: Secondary | ICD-10-CM | POA: Diagnosis not present

## 2022-09-21 DIAGNOSIS — R202 Paresthesia of skin: Secondary | ICD-10-CM

## 2022-09-21 DIAGNOSIS — W19XXXD Unspecified fall, subsequent encounter: Secondary | ICD-10-CM

## 2022-09-21 NOTE — Patient Instructions (Signed)
MRI cervical spine to rule out any cervical radiculopathy Continue current medications Continue to follow PCP Set up care with a psychiatrist and therapist to help with your increased stress Referral to physical therapy for gait training due to balance issue, falls and gait abnormality Follow-up in 54-month.

## 2022-09-21 NOTE — Progress Notes (Signed)
[facility]  PATIENT: [name] [name] DOB: 12-26-1975  REQUESTING CLINICIAN: Ashok Pall, MD HISTORY FROM: Patient and mother  REASON FOR VISIT: TIA    HISTORICAL  CHIEF COMPLAINT:  Chief Complaint  Patient presents with   New Patient (Initial Visit)    Rm 17. Accompanied by mother, [name] [name]. NP/Paper/Kyle Cabbell MD [facility]/TIA.    HISTORY OF PRESENT ILLNESS:  This is a 46 year old woman with multiple medical conditions including hypertension, hyperlipidemia, diabetes mellitus, history of back pain status post surgery, fibromyalgia, chronic pain, gait abnormality, anxiety who is presenting with multiple complaints.  Patient initially presented to the hospital on August 3 with new onset of slurred speech and left facial droop, MRI at that time was negative for any acute stroke but show T2 flair abnormality in the right frontal lobe.   She report 4 to 5 days of seizure problem, she said that she could not get the words out, she was tolerating and having jerking movement of her jaw and mouth.  She was diagnosed with TIA and sent home.  She still reporting stuttering and jerking movement of her jaw.  She stated couple times she bit her tongue.  She has additional symptoms listed below. Problem emptying bladder all the way, has to push out, she has not seen urology My hands itch and burn so bad, just the palms, daily, she has diabetes and on Lyrica Left leg weakness, last fall was 4 weeks ago, no injuries  Loss of balance problems, legs give out  Very sensitive to weather, difficulty cooling down when she feels hot Stiffness and pain during the cold weather  Memory loss  Constipation, diarrhea, and fatigue  Hard time swallowing, even liquid, has not seen GI Depression and crying spells  Anxiety and panic attacks, she is not under the care of psychiatrist/psychologist  Redness in face, all the time  Numbness and tingling bottom of feet  Left arm  numbness   OTHER MEDICAL CONDITIONS: Hypertension, hyperlipidemia, Diabetes, History of back pain s/p surgery, Fibromyalgia, chronic pain, gait abnormality    REVIEW OF SYSTEMS: Full 14 system review of systems performed and negative with exception of: As noted in the HPI   ALLERGIES: Allergies  Allergen Reactions   Oxycodone Hcl Nausea And Vomiting   Pollen Extract Other (See Comments)    Congestion, sneezing Congestion, sneezing    Topiramate Other (See Comments)    Jerking  Jerking  Jerking  Other reaction(s): Other (See Comments) Jerking     Bee Venom Hives, Itching and Swelling   Buspirone    Crestor [Rosuvastatin] Other (See Comments)    Muscle spasms   Doxycycline Nausea Only   Mirtazapine    Oxycontin [Oxycodone] Nausea Only   Wellbutrin [Bupropion]     HOME MEDICATIONS: Outpatient Medications Prior to Visit  Medication Sig Dispense Refill   albuterol (VENTOLIN HFA) 108 (90 Base) MCG/ACT inhaler Inhale 1-2 puffs into the lungs every 6 (six) hours as needed for wheezing or shortness of breath. 18 g 1   ammonium lactate (LAC-HYDRIN) 12 % lotion Apply 1 application topically as needed for dry skin. 400 g 0   Cholecalciferol 25 MCG (1000 UT) tablet Take 1 tablet (1,000 Units total) by mouth once a week. 15 tablet 0   dapagliflozin propanediol (FARXIGA) 10 MG TABS tablet Take 1 tablet (10 mg total) by mouth daily. 90 tablet 1   diclofenac Sodium (VOLTAREN) 1 % GEL SMARTSIG:2-4 Gram(s) Topical 1-4 Times Daily PRN  FLUoxetine (PROZAC) 40 MG capsule Take 1 capsule (40 mg total) by mouth daily. 90 capsule 1   fluticasone (FLONASE) 50 MCG/ACT nasal spray Place 2 sprays into both nostrils daily. 16 g 5   glucose blood (ONETOUCH VERIO) test strip Use 1 test strip to check glucose level three times daily and prn 300 each 12   HYDROmorphone (DILAUDID) 4 MG tablet Take 4 mg by mouth 4 (four) times daily as needed.     ibuprofen (ADVIL) 800 MG tablet Take 1 tablet (800 mg  total) by mouth every 6 (six) hours as needed. 60 tablet 1   insulin glargine, 2 Unit Dial, (TOUJEO MAX SOLOSTAR) 300 UNIT/ML Solostar Pen Inject 35 Units into the skin daily. 12 mL 1   Insulin Pen Needle 31G X 5 MM MISC Use as directed  Once a day Dx e11.65 100 each 3   Ipratropium-Albuterol (COMBIVENT RESPIMAT) 20-100 MCG/ACT AERS respimat Inhale 1 puff into the lungs every 6 (six) hours as needed for wheezing. 4 g 1   Lancets (ONETOUCH DELICA PLUS [id number]) MISC Use 1 lancet to check glucose three times daily and prn 300 each 3   levothyroxine (SYNTHROID) 50 MCG tablet Take 1 tablet (50 mcg total) by mouth daily. 90 tablet 1   methocarbamol (ROBAXIN) 500 MG tablet PLEASE SEE ATTACHED FOR DETAILED DIRECTIONS     morphine (MS CONTIN) 60 MG 12 hr tablet Take 60 mg by mouth every 12 (twelve) hours.     Multiple Vitamin (MULTI-VITAMIN) tablet Take 1 tablet by mouth daily.     nystatin (MYCOSTATIN/NYSTOP) powder Apply 1 application. topically 3 (three) times daily. 60 g 5   pantoprazole (PROTONIX) 40 MG tablet TAKE 1 TABLET BY MOUTH EVERY DAY 90 tablet 1   pravastatin (PRAVACHOL) 10 MG tablet Take 1 tablet (10 mg total) by mouth daily. 90 tablet 2   prazosin (MINIPRESS) 1 MG capsule Take 1 capsule (1 mg total) by mouth at bedtime. 30 capsule 1   pregabalin (LYRICA) 150 MG capsule Take 150 mg by mouth 3 (three) times daily.      rOPINIRole (REQUIP) 0.25 MG tablet Take 1-2 tabs at night for restless legs 180 tablet 3   Semaglutide, 2 MG/DOSE, 8 MG/3ML SOPN Inject 2 mg as directed once a week. 3 mL 2   traZODone (DESYREL) 50 MG tablet Take 0.5-1 tablets (25-50 mg total) by mouth at bedtime as needed for sleep. 30 tablet 3   Vitamin D, Ergocalciferol, (DRISDOL) 1.25 MG (50000 UNIT) CAPS capsule Take 1 capsule (50,000 Units total) by mouth every 7 (seven) days. 15 capsule 1   No facility-administered medications prior to visit.    PAST MEDICAL HISTORY: Past Medical History:  Diagnosis Date    Actinic keratitis    Allergy    Anxiety    Asthma    Basal cell carcinoma    Chronic venous insufficiency    DDD (degenerative disc disease), lumbar    Diabetes mellitus without complication (HCC)    Fibromyalgia    Fibromyalgia    Hyperlipidemia    Lymphedema    Melanoma ([place])    Migraine    Mitral valve prolapse    Tendonitis of foot    left    PAST SURGICAL HISTORY: Past Surgical History:  Procedure Laterality Date   ABDOMINAL HYSTERECTOMY     CARPAL TUNNEL RELEASE     FOOT TENDON SURGERY Right    KNEE SURGERY     SPINAL FUSION     SPINAL  FUSION      FAMILY HISTORY: Family History  Problem Relation Age of Onset   Cancer Paternal Aunt    Cancer Paternal Uncle    Diabetes Paternal Grandfather    Heart disease Paternal Grandfather     SOCIAL HISTORY: Social History   Socioeconomic History   Marital status: Divorced    Spouse name: Not on file   Number of children: Not on file   Years of education: Not on file   Highest education level: Not on file  Occupational History   Not on file  Tobacco Use   Smoking status: Never   Smokeless tobacco: Never  Vaping Use   Vaping Use: Never used  Substance and Sexual Activity   Alcohol use: No   Drug use: No   Sexual activity: Not on file  Other Topics Concern   Not on file  Social History Narrative   Not on file   Social Determinants of Health   Financial Resource Strain: Not on file  Food Insecurity: Not on file  Transportation Needs: Not on file  Physical Activity: Not on file  Stress: Not on file  Social Connections: Not on file  Intimate Partner Violence: Not on file    PHYSICAL EXAM  GENERAL EXAM/CONSTITUTIONAL: Vitals:  Vitals:   09/21/22 1512  BP: 105/64  Pulse: 77  Weight: 165 lb (74.8 kg)  Height: '5\' 1"'$  (1.549 m)   Body mass index is 31.18 kg/m. Wt Readings from Last 3 Encounters:  09/21/22 165 lb (74.8 kg)  09/08/22 165 lb 6.4 oz (75 kg)  07/31/22 166 lb (75.3 kg)   Patient is  in no distress; well developed, nourished and groomed; neck is supple  EYES: Pupils round and reactive to light, Visual fields full to confrontation, Extraocular movements intacts,   MUSCULOSKELETAL: Gait, strength, tone, movements noted in Neurologic exam below  NEUROLOGIC: MENTAL STATUS:     01/30/2022   10:32 AM 10/04/2020    3:45 PM  MMSE - Mini Mental State Exam  Orientation to time 5 5  Orientation to Place 5 5  Registration 3 3  Attention/ Calculation 5 5  Recall 3 3  Language- name 2 objects 2 2  Language- repeat 1 1  Language- follow 3 step command 3 3  Language- read & follow direction 1 1  Write a sentence 1 1  Copy design 1 1  Total score 30 30   awake, alert, oriented to person, place and time recent and remote memory intact normal attention and concentration language fluent, comprehension intact, naming intact. Initially noted to have slurred speech but when distraction, slurred speech resolved.  fund of knowledge appropriate  CRANIAL NERVE:  2nd, 3rd, 4th, 6th - pupils equal and reactive to light, visual fields full to confrontation, extraocular muscles intact, no nystagmus 5th - facial sensation symmetric 7th - facial strength symmetric 8th - hearing intact 9th - palate elevates symmetrically, uvula midline 11th - shoulder shrug symmetric 12th - tongue protrusion midline  MOTOR:  normal bulk and tone, full strength in the BUE, BLE  SENSORY:  normal and symmetric to light touch  COORDINATION:  finger-nose-finger, fine finger movements normal  REFLEXES:  deep tendon reflexes present and symmetric  GAIT/STATION:  Antalgic gait, favoring the left leg, Unable to tandem, positive Romberg      DIAGNOSTIC DATA (LABS, IMAGING, TESTING) - I reviewed patient records, labs, notes, testing and imaging myself where available.  Lab Results  Component Value Date   WBC 4.7  07/13/2022   HGB 14.3 07/13/2022   HCT 42.1 07/13/2022   MCV 86.6 07/13/2022    PLT 258 07/13/2022      Component Value Date/Time   NA 136 07/13/2022 1547   NA 137 12/21/2021 1055   K 4.4 07/13/2022 1547   CL 100 07/13/2022 1547   CO2 28 07/13/2022 1547   GLUCOSE 292 (H) 07/13/2022 1547   BUN 10 07/13/2022 1547   BUN 9 12/21/2021 1055   CREATININE 1.07 (H) 07/13/2022 1547   CALCIUM 8.7 (L) 07/13/2022 1547   PROT 6.7 07/13/2022 1547   PROT 6.6 12/21/2021 1055   ALBUMIN 3.8 07/13/2022 1547   ALBUMIN 4.3 12/21/2021 1055   AST 33 07/13/2022 1547   ALT 29 07/13/2022 1547   ALKPHOS 115 07/13/2022 1547   BILITOT 0.9 07/13/2022 1547   BILITOT 0.4 12/21/2021 1055   GFRNONAA >60 07/13/2022 1547   GFRAA >60 02/02/2020 2121   Lab Results  Component Value Date   CHOL 305 (H) 12/21/2021   HDL 45 12/21/2021   LDLCALC 192 (H) 12/21/2021   TRIG 341 (H) 12/21/2021   Lab Results  Component Value Date   HGBA1C 6.4 (A) 07/04/2022   Lab Results  Component Value Date   VITAMINB12 396 09/07/2020   Lab Results  Component Value Date   TSH 0.766 12/21/2021    MRI Brain without contrast 07/13/22 1. No acute intracranial process. No evidence of acute or subacute infarct. 2. Foci of increased T2 hyperintense signal in the right frontal lobe, which are nonspecific. While they can be seen in the setting of demyelinating disease, they are also seen in the setting of chronic small vessel ischemic disease or prior trauma, among other etiologies.  MRI Brain with contrast 07/13/22 No abnormal enhancement in the brain.   Carotid US  The RIGHT CAROTID shows less than 50% stenosis. The LEFT CAROTID shows less than 50% stenosis.  There is no significant plaque formation noted on the LEFT and no significant plaque on the RIGHT  side. Consider a repeat Carotid doppler if clinical situation and symptoms warrant in 6-12 months. Patient should be encouraged to change lifestyles such as smoking cessation, regular exercise and dietary modification. Use of statins in the right clinical  setting and ASA is encouraged.   ASSESSMENT AND PLAN  46 y.o. year old female with multiple medical conditions including hypertension, hyperlipidemia, diabetes mellitus, history of back pain status post surgery, fibromyalgia, chronic pain, gait abnormality, falls, bilateral hands and feet numbness who is presenting for TIA follow-up.  In terms of TIA, her MRI was negative for any acute abnormality, it shows evidence of T2 flair hyperintensity in the right frontal region.  Patient has multiple complaints today and was wondering if she has MS, I reassured her and confirmed with her that she does not have evidence of multiple sclerosis.  At this point due to ongoing complaints of pain and numbness in both hands, abnormal gait and falls will get MRI cervical spine to rule out radiculopathy/myelopathy.  I will also refer her to physical therapy.  I will contact the patient to go over the results.  Continue current medications.  I also encouraged her to set up care with a psychiatrist/therapist to help her with her anxiety and depression.  She voices understanding.  Continue to follow-up with PCP and return in 6 months.    1. TIA (transient ischemic attack)   2. Gait abnormality   3. Numbness and tingling in both hands  4. Fall, subsequent encounter      Patient Instructions  MRI cervical spine to rule out any cervical radiculopathy Continue current medications Continue to follow PCP Set up care with a psychiatrist and therapist to help with your increased stress Referral to physical therapy for gait training due to balance issue, falls and gait abnormality Follow-up in 29-month.  Orders Placed This Encounter  Procedures   [place]  Ambulatory referral to Physical Therapy    No orders of the defined types were placed in this encounter.   Return in about 6 months (around 03/23/2023).  I have spent a total of 65 minutes dedicated to this patient today, preparing to  see patient, performing a medically appropriate examination and evaluation, ordering tests and/or medications and procedures, and counseling and educating the patient/family/caregiver; independently interpreting result and communicating results to the family/patient/caregiver; and documenting clinical information in the electronic medical record.   AAlric Ran MD 09/21/2022, 9:21 PM  [facility] [address] [place] [phone]

## 2022-09-22 ENCOUNTER — Encounter: Payer: Self-pay | Admitting: Nurse Practitioner

## 2022-09-26 ENCOUNTER — Telehealth: Payer: Self-pay | Admitting: Neurology

## 2022-09-26 NOTE — Telephone Encounter (Signed)
[employer] medicare NPR sent to GI [phone] 

## 2022-09-27 ENCOUNTER — Encounter: Payer: Self-pay | Admitting: Nurse Practitioner

## 2022-10-01 ENCOUNTER — Encounter: Payer: Self-pay | Admitting: Nurse Practitioner

## 2022-10-02 DIAGNOSIS — M25562 Pain in left knee: Secondary | ICD-10-CM | POA: Diagnosis not present

## 2022-10-02 DIAGNOSIS — G8929 Other chronic pain: Secondary | ICD-10-CM | POA: Diagnosis not present

## 2022-10-02 DIAGNOSIS — M62838 Other muscle spasm: Secondary | ICD-10-CM | POA: Diagnosis not present

## 2022-10-02 DIAGNOSIS — G894 Chronic pain syndrome: Secondary | ICD-10-CM | POA: Diagnosis not present

## 2022-10-02 DIAGNOSIS — M797 Fibromyalgia: Secondary | ICD-10-CM | POA: Diagnosis not present

## 2022-10-02 DIAGNOSIS — M545 Low back pain, unspecified: Secondary | ICD-10-CM | POA: Diagnosis not present

## 2022-10-03 ENCOUNTER — Ambulatory Visit: Payer: Medicare Other | Admitting: Neurology

## 2022-10-13 DIAGNOSIS — G4733 Obstructive sleep apnea (adult) (pediatric): Secondary | ICD-10-CM | POA: Diagnosis not present

## 2022-10-17 ENCOUNTER — Other Ambulatory Visit: Payer: Self-pay | Admitting: Nurse Practitioner

## 2022-10-17 DIAGNOSIS — Z760 Encounter for issue of repeat prescription: Secondary | ICD-10-CM

## 2022-10-18 ENCOUNTER — Encounter: Payer: Self-pay | Admitting: Nurse Practitioner

## 2022-10-18 ENCOUNTER — Ambulatory Visit (INDEPENDENT_AMBULATORY_CARE_PROVIDER_SITE_OTHER): Payer: Medicare Other | Admitting: Nurse Practitioner

## 2022-10-18 VITALS — BP 113/89 | HR 90 | Temp 98.0°F | Resp 16 | Ht 61.0 in | Wt 165.4 lb

## 2022-10-18 DIAGNOSIS — Z760 Encounter for issue of repeat prescription: Secondary | ICD-10-CM | POA: Diagnosis not present

## 2022-10-18 DIAGNOSIS — G9050 Complex regional pain syndrome I, unspecified: Secondary | ICD-10-CM

## 2022-10-18 DIAGNOSIS — R9089 Other abnormal findings on diagnostic imaging of central nervous system: Secondary | ICD-10-CM | POA: Diagnosis not present

## 2022-10-18 DIAGNOSIS — E1165 Type 2 diabetes mellitus with hyperglycemia: Secondary | ICD-10-CM | POA: Diagnosis not present

## 2022-10-18 DIAGNOSIS — G459 Transient cerebral ischemic attack, unspecified: Secondary | ICD-10-CM

## 2022-10-18 DIAGNOSIS — F411 Generalized anxiety disorder: Secondary | ICD-10-CM

## 2022-10-18 DIAGNOSIS — F332 Major depressive disorder, recurrent severe without psychotic features: Secondary | ICD-10-CM

## 2022-10-18 LAB — POCT GLYCOSYLATED HEMOGLOBIN (HGB A1C): Hemoglobin A1C: 6.2 % — AB (ref 4.0–5.6)

## 2022-10-18 MED ORDER — ALPRAZOLAM 0.5 MG PO TABS: 0.5000 mg | ORAL_TABLET | Freq: Two times a day (BID) | ORAL | 1 refills | Status: DC | PRN

## 2022-10-18 MED ORDER — CARIPRAZINE HCL 1.5 MG PO CAPS: 1.5000 mg | ORAL_CAPSULE | Freq: Every day | ORAL | 1 refills | Status: DC

## 2022-10-18 NOTE — Telephone Encounter (Signed)
Pt had appt today.

## 2022-10-18 NOTE — Progress Notes (Signed)
[facility] [place], [place] [address]  Internal MEDICINE  Office Visit Note  Patient Name: [name] [name]  [id number]  [id number]  Date of Service: 10/18/2022  Chief Complaint  Patient presents with   Follow-up   Hyperlipidemia   Diabetes    HPI [name] [name] presents for a follow up visit for diabetes, high cholesterol, anxiety and depression, and history of  stroke-like symptoms Diabetes -- A1c 6.2 right now, controlled, taking ozempic 2 mg weekly.  High cholesterol -- taking pravastatin  Anxiety and depression -- stopped taking fluoxetine. Paroxetine, venlafaxine, mirtazapine, wellbutrin and buspirone did not work for different reasons. Currently taking trazodone which is helping with sleep.  Hx TIA, abnormal brain MRI and reflex dystrophy -- need new neuro referral, unable to reach the office from the initial referral.     Current Medication: Outpatient Encounter Medications as of 10/18/2022  Medication Sig   albuterol (VENTOLIN HFA) 108 (90 Base) MCG/ACT inhaler Inhale 1-2 puffs into the lungs every 6 (six) hours as needed for wheezing or shortness of breath.   ALPRAZolam (XANAX) 0.5 MG tablet Take 1 tablet (0.5 mg total) by mouth 2 (two) times daily as needed for anxiety.   ammonium lactate (LAC-HYDRIN) 12 % lotion Apply 1 application topically as needed for dry skin.   cariprazine (VRAYLAR) 1.5 MG capsule Take 1 capsule (1.5 mg total) by mouth daily.   Cholecalciferol 25 MCG (1000 UT) tablet Take 1 tablet (1,000 Units total) by mouth once a week.   dapagliflozin propanediol (FARXIGA) 10 MG TABS tablet Take 1 tablet (10 mg total) by mouth daily.   diclofenac Sodium (VOLTAREN) 1 % GEL SMARTSIG:2-4 Gram(s) Topical 1-4 Times Daily PRN   FLUoxetine (PROZAC) 40 MG capsule Take 1 capsule (40 mg total) by mouth daily.   fluticasone (FLONASE) 50 MCG/ACT nasal spray Place 2 sprays into both nostrils daily.   glucose blood (ONETOUCH VERIO) test strip Use 1  test strip to check glucose level three times daily and prn   HYDROmorphone (DILAUDID) 4 MG tablet Take 4 mg by mouth 4 (four) times daily as needed.   ibuprofen (ADVIL) 800 MG tablet Take 1 tablet (800 mg total) by mouth every 6 (six) hours as needed.   insulin glargine, 2 Unit Dial, (TOUJEO MAX SOLOSTAR) 300 UNIT/ML Solostar Pen Inject 35 Units into the skin daily.   Insulin Pen Needle 31G X 5 MM MISC Use as directed  Once a day Dx e11.65   Ipratropium-Albuterol (COMBIVENT RESPIMAT) 20-100 MCG/ACT AERS respimat Inhale 1 puff into the lungs every 6 (six) hours as needed for wheezing.   Lancets (ONETOUCH DELICA PLUS [id number]) MISC Use 1 lancet to check glucose three times daily and prn   levothyroxine (SYNTHROID) 50 MCG tablet TAKE 1 TABLET BY MOUTH EVERY DAY   methocarbamol (ROBAXIN) 500 MG tablet PLEASE SEE ATTACHED FOR DETAILED DIRECTIONS   morphine (MS CONTIN) 60 MG 12 hr tablet Take 60 mg by mouth every 12 (twelve) hours.   Multiple Vitamin (MULTI-VITAMIN) tablet Take 1 tablet by mouth daily.   nystatin (MYCOSTATIN/NYSTOP) powder Apply 1 application. topically 3 (three) times daily.   pantoprazole (PROTONIX) 40 MG tablet TAKE 1 TABLET BY MOUTH EVERY DAY   pravastatin (PRAVACHOL) 10 MG tablet TAKE 1 TABLET BY MOUTH EVERY DAY   pregabalin (LYRICA) 150 MG capsule Take 150 mg by mouth 3 (three) times daily.    rOPINIRole (REQUIP) 0.25 MG tablet Take 1-2 tabs at night for restless legs   Semaglutide,  2 MG/DOSE, 8 MG/3ML SOPN Inject 2 mg as directed once a week.   traZODone (DESYREL) 50 MG tablet Take 0.5-1 tablets (25-50 mg total) by mouth at bedtime as needed for sleep.   Vitamin D, Ergocalciferol, (DRISDOL) 1.25 MG (50000 UNIT) CAPS capsule Take 1 capsule (50,000 Units total) by mouth every 7 (seven) days.   [DISCONTINUED] prazosin (MINIPRESS) 1 MG capsule Take 1 capsule (1 mg total) by mouth at bedtime.   No facility-administered encounter medications on file as of 10/18/2022.    Surgical  History: Past Surgical History:  Procedure Laterality Date   ABDOMINAL HYSTERECTOMY     CARPAL TUNNEL RELEASE     FOOT TENDON SURGERY Right    KNEE SURGERY     SPINAL FUSION     SPINAL FUSION      Medical History: Past Medical History:  Diagnosis Date   Actinic keratitis    Allergy    Anxiety    Asthma    Basal cell carcinoma    Chronic venous insufficiency    DDD (degenerative disc disease), lumbar    Diabetes mellitus without complication (HCC)    Fibromyalgia    Fibromyalgia    Hyperlipidemia    Lymphedema    Melanoma ([place])    Migraine    Mitral valve prolapse    Tendonitis of foot    left    Family History: Family History  Problem Relation Age of Onset   Cancer Paternal Aunt    Cancer Paternal Uncle    Diabetes Paternal Grandfather    Heart disease Paternal Grandfather     Social History   Socioeconomic History   Marital status: Divorced    Spouse name: Not on file   Number of children: Not on file   Years of education: Not on file   Highest education level: Not on file  Occupational History   Not on file  Tobacco Use   Smoking status: Never   Smokeless tobacco: Never  Vaping Use   Vaping Use: Never used  Substance and Sexual Activity   Alcohol use: No   Drug use: No   Sexual activity: Not on file  Other Topics Concern   Not on file  Social History Narrative   Not on file   Social Determinants of Health   Financial Resource Strain: Not on file  Food Insecurity: Not on file  Transportation Needs: Not on file  Physical Activity: Not on file  Stress: Not on file  Social Connections: Not on file  Intimate Partner Violence: Not on file      Review of Systems  Constitutional:  Negative for chills, fatigue and unexpected weight change.  HENT:  Negative for congestion, postnasal drip, rhinorrhea, sneezing and sore throat.   Eyes:  Negative for redness.  Respiratory:  Positive for shortness of breath. Negative for cough, chest tightness  and wheezing.   Cardiovascular:  Negative for chest pain and palpitations.  Gastrointestinal:  Negative for abdominal pain, constipation, diarrhea, nausea and vomiting.  Genitourinary:  Negative for dysuria and frequency.  Musculoskeletal:  Negative for arthralgias, back pain, joint swelling and neck pain.  Skin:  Negative for rash.  Neurological:  Positive for tremors. Negative for numbness.       Speech difficulty   Hematological:  Negative for adenopathy. Does not bruise/bleed easily.  Psychiatric/Behavioral:  Positive for behavioral problems (Depression), dysphoric mood and sleep disturbance. Negative for self-injury and suicidal ideas. The patient is nervous/anxious.     Vital Signs: BP  113/89   Pulse 90   Temp 98 F (36.7 C)   Resp 16   Ht '5\' 1"'$  (1.549 m)   Wt 165 lb 6.4 oz (75 kg)   LMP 12/13/1991   SpO2 93%   BMI 31.25 kg/m    Physical Exam Vitals reviewed.  Constitutional:      General: She is not in acute distress.    Appearance: Normal appearance. She is obese. She is not ill-appearing.  HENT:     Head: Normocephalic and atraumatic.     Mouth/Throat:     Pharynx: No posterior oropharyngeal erythema.  Eyes:     Pupils: Pupils are equal, round, and reactive to light.  Cardiovascular:     Pulses: Normal pulses.     Heart sounds: Normal heart sounds.  Pulmonary:     Effort: Pulmonary effort is normal. No respiratory distress.     Breath sounds: Normal breath sounds. No wheezing.  Neurological:     Mental Status: She is alert.     Motor: No weakness.     Coordination: Coordination normal.  Psychiatric:        Attention and Perception: She is attentive.        Mood and Affect: Mood is anxious and depressed. Affect is tearful.        Speech: Speech is delayed.        Behavior: Behavior normal. Behavior is cooperative.     Comments: Pt is tearful, sad and depressed        Assessment/Plan: 1. Abnormal finding on MRI of brain New referral to neuro -  Ambulatory referral to Neurology  2. TIA (transient ischemic attack) See problem #1 - Ambulatory referral to Neurology  3. Reflex sympathetic dystrophy See problem #1 and #2.  - Ambulatory referral to Neurology  4. Uncontrolled type 2 diabetes mellitus with hyperglycemia (HCC) A1c stable with ozempic.  - POCT glycosylated hemoglobin (Hb A1C)  5. Severe episode of recurrent major depressive disorder, without psychotic features ([place]) Continue vraylar, samples were previously given and patient states it is helping her,  - cariprazine (VRAYLAR) 1.5 MG capsule; Take 1 capsule (1.5 mg total) by mouth daily.  Dispense: 30 capsule; Refill: 1  6. GAD (generalized anxiety disorder) Alprazolam prn and vraylar prescribed for additional coverage for anxiety symptoms.  - cariprazine (VRAYLAR) 1.5 MG capsule; Take 1 capsule (1.5 mg total) by mouth daily.  Dispense: 30 capsule; Refill: 1 - ALPRAZolam (XANAX) 0.5 MG tablet; Take 1 tablet (0.5 mg total) by mouth 2 (two) times daily as needed for anxiety.  Dispense: 30 tablet; Refill: 1  7. Medication refill - ibuprofen (ADVIL) 800 MG tablet; Take 1 tablet (800 mg total) by mouth every 6 (six) hours as needed.  Dispense: 60 tablet; Refill: 1   General Counseling: Tanijah verbalizes understanding of the findings of todays visit and agrees with plan of treatment. I have discussed any further diagnostic evaluation that may be needed or ordered today. We also reviewed her medications today. she has been encouraged to call the office with any questions or concerns that should arise related to todays visit.    Orders Placed This Encounter  Procedures   POCT glycosylated hemoglobin (Hb A1C)    Meds ordered this encounter  Medications   cariprazine (VRAYLAR) 1.5 MG capsule    Sig: Take 1 capsule (1.5 mg total) by mouth daily.    Dispense:  30 capsule    Refill:  1    If need prior auth please  send to [place] asap.   ALPRAZolam (XANAX) 0.5 MG tablet     Sig: Take 1 tablet (0.5 mg total) by mouth 2 (two) times daily as needed for anxiety.    Dispense:  30 tablet    Refill:  1    Return in about 20 days (around 11/07/2022) for F/U, eval new med, [place] PCP .   Total time spent:30 Minutes Time spent includes review of chart, medications, test results, and follow up plan with the patient.   [place] Controlled Substance Database was reviewed by me.  This patient was seen by Jonetta Osgood, FNP-C in collaboration with Dr. Clayborn Bigness as a part of collaborative care agreement.   Lucille Witts R. Valetta Fuller, MSN, FNP-C Internal medicine

## 2022-10-24 ENCOUNTER — Encounter: Payer: Self-pay | Admitting: Nurse Practitioner

## 2022-10-30 ENCOUNTER — Telehealth: Payer: Medicare Other | Admitting: Physician Assistant

## 2022-10-30 ENCOUNTER — Telehealth: Payer: Self-pay

## 2022-10-30 DIAGNOSIS — U071 COVID-19: Secondary | ICD-10-CM | POA: Diagnosis not present

## 2022-10-30 MED ORDER — FLUTICASONE PROPIONATE 50 MCG/ACT NA SUSP: 2.0000 | Freq: Every day | NASAL | 5 refills | Status: AC

## 2022-10-30 MED ORDER — ALBUTEROL SULFATE HFA 108 (90 BASE) MCG/ACT IN AERS: 1.0000 | INHALATION_SPRAY | Freq: Four times a day (QID) | RESPIRATORY_TRACT | 1 refills | Status: DC | PRN

## 2022-10-30 MED ORDER — BENZONATATE 100 MG PO CAPS: 100.0000 mg | ORAL_CAPSULE | Freq: Three times a day (TID) | ORAL | 0 refills | Status: DC | PRN

## 2022-10-30 NOTE — Progress Notes (Signed)
  E-Visit  for Positive Covid Test Result  We are sorry you are not feeling well. We are here to help!  You have tested positive for COVID-19, meaning that you were infected with the novel coronavirus and could give the virus to others.  It is vitally important that you stay home so you do not spread it to others.      Please continue isolation at home, for at least 10 days since the start of your symptoms and until you have had 24 hours with no fever (without taking a fever reducer) and with improving of symptoms.  If you have no symptoms but tested positive (or all symptoms resolve after 5 days and you have no fever) you can leave your house but continue to wear a mask around others for an additional 5 days. If you have a fever,continue to stay home until you have had 24 hours of no fever. Most cases improve 5-10 days from onset but we have seen a small number of patients who have gotten worse after the 10 days.  Please be sure to watch for worsening symptoms and remain taking the proper precautions.   Go to the nearest hospital ED for assessment if fever/cough/breathlessness are severe or illness seems like a threat to life.    The following symptoms may appear 2-14 days after exposure: Fever Cough Shortness of breath or difficulty breathing Chills Repeated shaking with chills Muscle pain Headache Sore throat New loss of taste or smell Fatigue Congestion or runny nose Nausea or vomiting Diarrhea  You have been enrolled in MyChart Home Monitoring for COVID-19. Daily you will receive a questionnaire within the MyChart website. Our COVID-19 response team will be monitoring your responses daily.  You can use medication such as prescription cough medication called Tessalon Perles 100 mg. You may take 1-2 capsules every 8 hours as needed for cough,  prescription inhaler called Albuterol MDI 90 mcg /actuation 2 puffs every 4 hours as needed for shortness of breath, wheezing, cough, and  prescription for Fluticasone nasal spray 2 sprays in each nostril one time per day  You may also take acetaminophen (Tylenol) as needed for fever.  HOME CARE: Only take medications as instructed by your medical team. Drink plenty of fluids and get plenty of rest. A steam or ultrasonic humidifier can help if you have congestion.   GET HELP RIGHT AWAY IF YOU HAVE EMERGENCY WARNING SIGNS.  Call 911 or proceed to your closest emergency facility if: You develop worsening high fever. Trouble breathing Bluish lips or face Persistent pain or pressure in the chest New confusion Inability to wake or stay awake You cough up blood. Your symptoms become more severe Inability to hold down food or fluids  This list is not all possible symptoms. Contact your medical provider for any symptoms that are severe or concerning to you.    Your e-visit answers were reviewed by a board certified advanced clinical practitioner to complete your personal care plan.  Depending on the condition, your plan could have included both over the counter or prescription medications.  If there is a problem please reply once you have received a response from your provider.  Your safety is important to us.  If you have drug allergies check your prescription carefully.    You can use MyChart to ask questions about today's visit, request a non-urgent call back, or ask for a work or school excuse for 24 hours related to this e-Visit. If it has   been greater than 24 hours you will need to follow up with your provider, or enter a new e-Visit to address those concerns. You will get an e-mail in the next two days asking about your experience.  I hope that your e-visit has been valuable and will speed your recovery. Thank you for using e-visits.   I have spent 5 minutes in review of e-visit questionnaire, review and updating patient chart, medical decision making and response to patient.   Mar Daring, PA-C

## 2022-10-31 DIAGNOSIS — M797 Fibromyalgia: Secondary | ICD-10-CM | POA: Diagnosis not present

## 2022-10-31 DIAGNOSIS — M62838 Other muscle spasm: Secondary | ICD-10-CM | POA: Diagnosis not present

## 2022-10-31 DIAGNOSIS — G894 Chronic pain syndrome: Secondary | ICD-10-CM | POA: Diagnosis not present

## 2022-10-31 DIAGNOSIS — M25562 Pain in left knee: Secondary | ICD-10-CM | POA: Diagnosis not present

## 2022-10-31 DIAGNOSIS — G8929 Other chronic pain: Secondary | ICD-10-CM | POA: Diagnosis not present

## 2022-10-31 DIAGNOSIS — M545 Low back pain, unspecified: Secondary | ICD-10-CM | POA: Diagnosis not present

## 2022-11-01 ENCOUNTER — Encounter: Payer: Self-pay | Admitting: Nurse Practitioner

## 2022-11-01 MED ORDER — IBUPROFEN 800 MG PO TABS: 800.0000 mg | ORAL_TABLET | Freq: Four times a day (QID) | ORAL | 1 refills | Status: DC | PRN

## 2022-11-06 ENCOUNTER — Telehealth: Payer: Self-pay | Admitting: Nurse Practitioner

## 2022-11-06 NOTE — Telephone Encounter (Signed)
Awaiting 10/18/22 office notes for Neurology referral[facility]

## 2022-11-14 ENCOUNTER — Ambulatory Visit: Payer: Medicare Other | Admitting: Nurse Practitioner

## 2022-11-16 ENCOUNTER — Other Ambulatory Visit: Payer: Self-pay | Admitting: Nurse Practitioner

## 2022-11-16 DIAGNOSIS — Z760 Encounter for issue of repeat prescription: Secondary | ICD-10-CM

## 2022-11-28 ENCOUNTER — Other Ambulatory Visit: Payer: Self-pay

## 2022-11-28 ENCOUNTER — Other Ambulatory Visit: Payer: Self-pay | Admitting: Nurse Practitioner

## 2022-11-28 DIAGNOSIS — G4709 Other insomnia: Secondary | ICD-10-CM

## 2022-11-28 DIAGNOSIS — F16283 Hallucinogen dependence with hallucinogen persisting perception disorder (flashbacks): Secondary | ICD-10-CM

## 2022-11-28 DIAGNOSIS — Z760 Encounter for issue of repeat prescription: Secondary | ICD-10-CM

## 2022-11-28 MED ORDER — COMBIVENT RESPIMAT 20-100 MCG/ACT IN AERS: 1.0000 | INHALATION_SPRAY | Freq: Four times a day (QID) | RESPIRATORY_TRACT | 1 refills | Status: DC | PRN

## 2022-11-30 NOTE — Telephone Encounter (Signed)
done

## 2022-12-05 ENCOUNTER — Telehealth: Payer: Self-pay | Admitting: Nurse Practitioner

## 2022-12-05 NOTE — Telephone Encounter (Signed)
Neurology referral sent via [employer] to Dr. Cristie Hem

## 2022-12-13 ENCOUNTER — Telehealth: Payer: Self-pay | Admitting: Nurse Practitioner

## 2022-12-13 NOTE — Telephone Encounter (Signed)
Neurology referral sent via [employer] to Dr. Manuella Ghazi with [facility]

## 2022-12-25 ENCOUNTER — Encounter: Payer: Self-pay | Admitting: Nurse Practitioner

## 2022-12-25 ENCOUNTER — Ambulatory Visit (INDEPENDENT_AMBULATORY_CARE_PROVIDER_SITE_OTHER): Payer: Medicare Other | Admitting: Nurse Practitioner

## 2022-12-25 VITALS — BP 139/79 | HR 81 | Temp 98.1°F | Resp 16 | Ht 61.0 in | Wt 161.2 lb

## 2022-12-25 DIAGNOSIS — R1031 Right lower quadrant pain: Secondary | ICD-10-CM | POA: Diagnosis not present

## 2022-12-25 DIAGNOSIS — R197 Diarrhea, unspecified: Secondary | ICD-10-CM

## 2022-12-25 DIAGNOSIS — N390 Urinary tract infection, site not specified: Secondary | ICD-10-CM | POA: Diagnosis not present

## 2022-12-25 DIAGNOSIS — Z760 Encounter for issue of repeat prescription: Secondary | ICD-10-CM

## 2022-12-25 DIAGNOSIS — R319 Hematuria, unspecified: Secondary | ICD-10-CM | POA: Diagnosis not present

## 2022-12-25 DIAGNOSIS — Z79899 Other long term (current) drug therapy: Secondary | ICD-10-CM

## 2022-12-25 DIAGNOSIS — G4709 Other insomnia: Secondary | ICD-10-CM

## 2022-12-25 DIAGNOSIS — R300 Dysuria: Secondary | ICD-10-CM | POA: Diagnosis not present

## 2022-12-25 DIAGNOSIS — R110 Nausea: Secondary | ICD-10-CM

## 2022-12-25 DIAGNOSIS — F411 Generalized anxiety disorder: Secondary | ICD-10-CM

## 2022-12-25 DIAGNOSIS — F332 Major depressive disorder, recurrent severe without psychotic features: Secondary | ICD-10-CM

## 2022-12-25 DIAGNOSIS — E1165 Type 2 diabetes mellitus with hyperglycemia: Secondary | ICD-10-CM

## 2022-12-25 LAB — POCT URINALYSIS DIPSTICK
Bilirubin, UA: NEGATIVE
Glucose, UA: NEGATIVE
Ketones, UA: NEGATIVE
Leukocytes, UA: NEGATIVE
Nitrite, UA: NEGATIVE
Protein, UA: NEGATIVE
Spec Grav, UA: 1.015 (ref 1.010–1.025)
Urobilinogen, UA: 0.2 E.U./dL
pH, UA: 6.5 (ref 5.0–8.0)

## 2022-12-25 MED ORDER — FLUOXETINE HCL 40 MG PO CAPS: 40.0000 mg | ORAL_CAPSULE | Freq: Every day | ORAL | 1 refills | Status: DC

## 2022-12-25 MED ORDER — PANTOPRAZOLE SODIUM 40 MG PO TBEC: 40.0000 mg | DELAYED_RELEASE_TABLET | Freq: Every day | ORAL | 1 refills | Status: DC

## 2022-12-25 MED ORDER — SEMAGLUTIDE (2 MG/DOSE) 8 MG/3ML ~~LOC~~ SOPN: 2.0000 mg | PEN_INJECTOR | SUBCUTANEOUS | 2 refills | Status: DC

## 2022-12-25 MED ORDER — TRAZODONE HCL 50 MG PO TABS: 25.0000 mg | ORAL_TABLET | Freq: Every evening | ORAL | 3 refills | Status: DC | PRN

## 2022-12-25 MED ORDER — PROMETHAZINE HCL 25 MG PO TABS: 25.0000 mg | ORAL_TABLET | Freq: Three times a day (TID) | ORAL | 0 refills | Status: DC | PRN

## 2022-12-25 MED ORDER — CARIPRAZINE HCL 1.5 MG PO CAPS: 1.5000 mg | ORAL_CAPSULE | Freq: Every day | ORAL | 1 refills | Status: DC

## 2022-12-25 NOTE — Progress Notes (Signed)
[facility] ([facility]) [name], [name] [id number]  Internal MEDICINE  Office Visit Note  Patient Name: [name] [name]  [id number]  [id number]  Date of Service: 12/25/2022  Chief Complaint  Patient presents with   GI Problem    Patient has been having ongoing abdominal pain. Pain is to the lower right side, some lower back pain on the right side that has resolved. She had nausea and vomiting one week ago. Uncontrollable diarrhea that has also resolved. Possible C-Diff exposure in December.     HPI [name] presents for an acute sick visit for abdominal pain, diarrhea and nausea nad vomiting.  Last Wednesday is wehtn symptoms started,  Was Having diarrhea with fecal incontinence, fecal urgency. Worried she had c.diff infection since she was exposed to someone with it, but now the diarrhea has stopped and her stools are formed.  Thought she had UTI, had lower abdominal pain, and right side abdominal pain.  Urinalysis was negative.  Still having abdominal pain Need several refills.     Current Medication:  Outpatient Encounter Medications as of 12/25/2022  Medication Sig   promethazine (PHENERGAN) 25 MG tablet Take 1 tablet (25 mg total) by mouth every 8 (eight) hours as needed for nausea or vomiting.   albuterol (VENTOLIN HFA) 108 (90 Base) MCG/ACT inhaler Inhale 1-2 puffs into the lungs every 6 (six) hours as needed for wheezing or shortness of breath.   ALPRAZolam (XANAX) 0.5 MG tablet Take 1 tablet (0.5 mg total) by mouth 2 (two) times daily as needed for anxiety.   ammonium lactate (LAC-HYDRIN) 12 % lotion Apply 1 application topically as needed for dry skin.   benzonatate (TESSALON) 100 MG capsule Take 1 capsule (100 mg total) by mouth 3 (three) times daily as needed.   cariprazine (VRAYLAR) 1.5 MG capsule Take 1 capsule (1.5 mg total) by mouth daily.   Cholecalciferol 25 MCG (1000 UT) tablet Take 1 tablet (1,000 Units total) by mouth once a week.    diclofenac Sodium (VOLTAREN) 1 % GEL SMARTSIG:2-4 Gram(s) Topical 1-4 Times Daily PRN   FARXIGA 10 MG TABS tablet TAKE 1 TABLET BY MOUTH EVERY DAY   FLUoxetine (PROZAC) 40 MG capsule Take 1 capsule (40 mg total) by mouth daily.   fluticasone (FLONASE) 50 MCG/ACT nasal spray Place 2 sprays into both nostrils daily.   glucose blood (ONETOUCH VERIO) test strip Use 1 test strip to check glucose level three times daily and prn   HYDROmorphone (DILAUDID) 4 MG tablet Take 4 mg by mouth 4 (four) times daily as needed.   ibuprofen (ADVIL) 800 MG tablet Take 1 tablet (800 mg total) by mouth every 6 (six) hours as needed.   insulin glargine, 2 Unit Dial, (TOUJEO MAX SOLOSTAR) 300 UNIT/ML Solostar Pen Inject 35 Units into the skin daily.   Insulin Pen Needle 31G X 5 MM MISC Use as directed  Once a day Dx e11.65   Ipratropium-Albuterol (COMBIVENT RESPIMAT) 20-100 MCG/ACT AERS respimat Inhale 1 puff into the lungs every 6 (six) hours as needed for wheezing.   Lancets (ONETOUCH DELICA PLUS [id number]) MISC Use 1 lancet to check glucose three times daily and prn   levothyroxine (SYNTHROID) 50 MCG tablet TAKE 1 TABLET BY MOUTH EVERY DAY   methocarbamol (ROBAXIN) 500 MG tablet PLEASE SEE ATTACHED FOR DETAILED DIRECTIONS   morphine (MS CONTIN) 60 MG 12 hr tablet Take 60 mg by mouth every 12 (twelve) hours.   Multiple Vitamin (MULTI-VITAMIN) tablet Take 1 tablet by  mouth daily.   nystatin (MYCOSTATIN/NYSTOP) powder Apply 1 application. topically 3 (three) times daily.   pantoprazole (PROTONIX) 40 MG tablet Take 1 tablet (40 mg total) by mouth daily.   pravastatin (PRAVACHOL) 10 MG tablet TAKE 1 TABLET BY MOUTH EVERY DAY   pregabalin (LYRICA) 150 MG capsule Take 150 mg by mouth 3 (three) times daily.    rOPINIRole (REQUIP) 0.25 MG tablet Take 1-2 tabs at night for restless legs   Semaglutide, 2 MG/DOSE, 8 MG/3ML SOPN Inject 2 mg as directed once a week.   traZODone (DESYREL) 50 MG tablet Take 0.5-1 tablets (25-50 mg  total) by mouth at bedtime as needed for sleep.   Vitamin D, Ergocalciferol, (DRISDOL) 1.25 MG (50000 UNIT) CAPS capsule Take 1 capsule (50,000 Units total) by mouth every 7 (seven) days.   [DISCONTINUED] cariprazine (VRAYLAR) 1.5 MG capsule Take 1 capsule (1.5 mg total) by mouth daily.   [DISCONTINUED] FLUoxetine (PROZAC) 40 MG capsule Take 1 capsule (40 mg total) by mouth daily.   [DISCONTINUED] pantoprazole (PROTONIX) 40 MG tablet TAKE 1 TABLET BY MOUTH EVERY DAY   [DISCONTINUED] Semaglutide, 2 MG/DOSE, 8 MG/3ML SOPN Inject 2 mg as directed once a week.   [DISCONTINUED] traZODone (DESYREL) 50 MG tablet Take 0.5-1 tablets (25-50 mg total) by mouth at bedtime as needed for sleep.   No facility-administered encounter medications on file as of 12/25/2022.      Medical History: Past Medical History:  Diagnosis Date   Actinic keratitis    Allergy    Anxiety    Asthma    Basal cell carcinoma    Chronic venous insufficiency    DDD (degenerative disc disease), lumbar    Diabetes mellitus without complication (HCC)    Fibromyalgia    Fibromyalgia    Hyperlipidemia    Lymphedema    Melanoma ([place])    Migraine    Mitral valve prolapse    Tendonitis of foot    left     Vital Signs: BP 139/79   Pulse 81   Temp 98.1 F (36.7 C)   Resp 16   Ht '5\' 1"'$  (1.549 m)   Wt 161 lb 3.2 oz (73.1 kg)   LMP 12/13/1991   SpO2 99%   BMI 30.46 kg/m    Review of Systems  Constitutional:  Positive for fatigue.  HENT: Negative.    Respiratory:  Negative for cough, chest tightness, shortness of breath and wheezing.   Cardiovascular:  Negative for chest pain and palpitations.  Gastrointestinal:  Positive for abdominal distention, abdominal pain, diarrhea, nausea and vomiting.  Genitourinary:  Positive for dysuria, frequency, pelvic pain and urgency.  Musculoskeletal:  Positive for back pain.  Neurological: Negative.     Physical Exam Vitals reviewed.  Constitutional:      Appearance:  Normal appearance. She is obese.  HENT:     Head: Normocephalic and atraumatic.  Eyes:     Pupils: Pupils are equal, round, and reactive to light.  Pulmonary:     Effort: Pulmonary effort is normal. No respiratory distress.  Abdominal:     General: Bowel sounds are normal. There is distension.     Palpations: Abdomen is soft.     Tenderness: There is abdominal tenderness. There is guarding.  Neurological:     Mental Status: She is alert and oriented to person, place, and time.  Psychiatric:        Mood and Affect: Mood normal.        Behavior: Behavior normal.  Assessment/Plan: 1. Right lower quadrant abdominal pain CT scan ordered for further evaluation, follow up in 1 week to discuss results. Also consider GI referral.  - CT Abdomen Pelvis Wo Contrast; Future  2. Diarrhea of presumed infectious origin CT scan ordered for further evaluation - CT Abdomen Pelvis Wo Contrast; Future  3. Nausea without vomiting Promethazine ordered for nausea. Take as prescribed.  - promethazine (PHENERGAN) 25 MG tablet; Take 1 tablet (25 mg total) by mouth every 8 (eight) hours as needed for nausea or vomiting.  Dispense: 20 tablet; Refill: 0  4. Dysuria Urinalysis negative for UTI - CULTURE, URINE COMPREHENSIVE  5. Encounter for medication review Meds reviewed, refills ordered - cariprazine (VRAYLAR) 1.5 MG capsule; Take 1 capsule (1.5 mg total) by mouth daily.  Dispense: 30 capsule; Refill: 1 - Semaglutide, 2 MG/DOSE, 8 MG/3ML SOPN; Inject 2 mg as directed once a week.  Dispense: 3 mL; Refill: 2 - traZODone (DESYREL) 50 MG tablet; Take 0.5-1 tablets (25-50 mg total) by mouth at bedtime as needed for sleep.  Dispense: 30 tablet; Refill: 3 - pantoprazole (PROTONIX) 40 MG tablet; Take 1 tablet (40 mg total) by mouth daily.  Dispense: 90 tablet; Refill: 1 - FLUoxetine (PROZAC) 40 MG capsule; Take 1 capsule (40 mg total) by mouth daily.  Dispense: 90 capsule; Refill: 1   General  Counseling: Leighton verbalizes understanding of the findings of todays visit and agrees with plan of treatment. I have discussed any further diagnostic evaluation that may be needed or ordered today. We also reviewed her medications today. she has been encouraged to call the office with any questions or concerns that should arise related to todays visit.    Counseling:    Orders Placed This Encounter  Procedures   CULTURE, URINE COMPREHENSIVE   CT Abdomen Pelvis Wo Contrast   POCT Urinalysis Dipstick    Meds ordered this encounter  Medications   promethazine (PHENERGAN) 25 MG tablet    Sig: Take 1 tablet (25 mg total) by mouth every 8 (eight) hours as needed for nausea or vomiting.    Dispense:  20 tablet    Refill:  0   cariprazine (VRAYLAR) 1.5 MG capsule    Sig: Take 1 capsule (1.5 mg total) by mouth daily.    Dispense:  30 capsule    Refill:  1    If need prior auth please send to [place] asap. Fax # [phone]   Semaglutide, 2 MG/DOSE, 8 MG/3ML SOPN    Sig: Inject 2 mg as directed once a week.    Dispense:  3 mL    Refill:  2    refill   traZODone (DESYREL) 50 MG tablet    Sig: Take 0.5-1 tablets (25-50 mg total) by mouth at bedtime as needed for sleep.    Dispense:  30 tablet    Refill:  3   pantoprazole (PROTONIX) 40 MG tablet    Sig: Take 1 tablet (40 mg total) by mouth daily.    Dispense:  90 tablet    Refill:  1   FLUoxetine (PROZAC) 40 MG capsule    Sig: Take 1 capsule (40 mg total) by mouth daily.    Dispense:  90 capsule    Refill:  1    Return in about 9 days (around 01/03/2023) for F/U review CT scan. .  [place] Controlled Substance Database was reviewed by me for overdose risk score (ORS)  Time spent:30 Minutes Time spent with patient included reviewing progress notes, labs,  imaging studies, and discussing plan for follow up.   This patient was seen by Jonetta Osgood, FNP-C in collaboration with Dr. Clayborn Bigness as a part of collaborative care  agreement.  Angee Gupton R. Valetta Fuller, MSN, FNP-C Internal Medicine

## 2022-12-26 ENCOUNTER — Telehealth: Payer: Self-pay

## 2022-12-26 ENCOUNTER — Encounter: Payer: Self-pay | Admitting: Nurse Practitioner

## 2022-12-26 NOTE — Telephone Encounter (Signed)
PA was sent for Vraylar.

## 2022-12-26 NOTE — Telephone Encounter (Signed)
Patient was approved for Vraylar. Patient notified.

## 2022-12-27 DIAGNOSIS — G4733 Obstructive sleep apnea (adult) (pediatric): Secondary | ICD-10-CM | POA: Diagnosis not present

## 2022-12-27 LAB — CULTURE, URINE COMPREHENSIVE

## 2022-12-28 ENCOUNTER — Ambulatory Visit
Admission: RE | Admit: 2022-12-28 | Discharge: 2022-12-28 | Disposition: A | Discharge status: Home / Self Care | Payer: Medicare Other | Source: Ambulatory Visit | Attending: Nurse Practitioner | Admitting: Nurse Practitioner

## 2022-12-28 ENCOUNTER — Encounter: Payer: Self-pay | Admitting: Nurse Practitioner

## 2022-12-28 DIAGNOSIS — K5730 Diverticulosis of large intestine without perforation or abscess without bleeding: Secondary | ICD-10-CM | POA: Diagnosis not present

## 2022-12-28 DIAGNOSIS — R197 Diarrhea, unspecified: Secondary | ICD-10-CM | POA: Insufficient documentation

## 2022-12-28 DIAGNOSIS — R109 Unspecified abdominal pain: Secondary | ICD-10-CM | POA: Diagnosis not present

## 2022-12-31 ENCOUNTER — Encounter: Payer: Self-pay | Admitting: Nurse Practitioner

## 2023-01-01 DIAGNOSIS — M25562 Pain in left knee: Secondary | ICD-10-CM | POA: Diagnosis not present

## 2023-01-01 DIAGNOSIS — M545 Low back pain, unspecified: Secondary | ICD-10-CM | POA: Diagnosis not present

## 2023-01-01 DIAGNOSIS — M797 Fibromyalgia: Secondary | ICD-10-CM | POA: Diagnosis not present

## 2023-01-01 DIAGNOSIS — G8929 Other chronic pain: Secondary | ICD-10-CM | POA: Diagnosis not present

## 2023-01-01 DIAGNOSIS — M62838 Other muscle spasm: Secondary | ICD-10-CM | POA: Diagnosis not present

## 2023-01-01 DIAGNOSIS — G4733 Obstructive sleep apnea (adult) (pediatric): Secondary | ICD-10-CM | POA: Diagnosis not present

## 2023-01-01 DIAGNOSIS — G894 Chronic pain syndrome: Secondary | ICD-10-CM | POA: Diagnosis not present

## 2023-01-01 MED ORDER — RELISTOR 150 MG PO TABS: 450.0000 mg | ORAL_TABLET | Freq: Every day | ORAL | 1 refills | Status: DC | PRN

## 2023-01-01 MED ORDER — ONDANSETRON 4 MG PO TBDP: 4.0000 mg | ORAL_TABLET | Freq: Three times a day (TID) | ORAL | 0 refills | Status: DC | PRN

## 2023-01-01 NOTE — Addendum Note (Signed)
Addended by: Jonetta Osgood on: 01/01/2023 08:08 AM   Modules accepted: Orders

## 2023-01-03 ENCOUNTER — Ambulatory Visit: Payer: Medicare Other

## 2023-01-04 ENCOUNTER — Encounter: Payer: Self-pay | Admitting: Nurse Practitioner

## 2023-01-04 ENCOUNTER — Ambulatory Visit (INDEPENDENT_AMBULATORY_CARE_PROVIDER_SITE_OTHER): Payer: Medicare Other | Admitting: Nurse Practitioner

## 2023-01-04 VITALS — BP 118/68 | HR 79 | Temp 97.7°F | Resp 16 | Ht 61.0 in | Wt 164.6 lb

## 2023-01-04 DIAGNOSIS — R1114 Bilious vomiting: Secondary | ICD-10-CM

## 2023-01-04 DIAGNOSIS — R1031 Right lower quadrant pain: Secondary | ICD-10-CM

## 2023-01-04 DIAGNOSIS — R1011 Right upper quadrant pain: Secondary | ICD-10-CM

## 2023-01-04 DIAGNOSIS — K458 Other specified abdominal hernia without obstruction or gangrene: Secondary | ICD-10-CM

## 2023-01-04 DIAGNOSIS — K824 Cholesterolosis of gallbladder: Secondary | ICD-10-CM | POA: Diagnosis not present

## 2023-01-04 NOTE — Progress Notes (Signed)
[facility] [place], [place] [address]  Internal MEDICINE  Office Visit Note  Patient Name: [name] [name]  [id number]  [id number]  Date of Service: 01/06/2023  Chief Complaint  Patient presents with   Follow-up    Questions about the CT    HPI [name] [name] presents for a follow-up visit for RUQ abdominal pain, nausea, vomiting, abdominal hernia and gallbladder polyps.  Gallbladder polyp -- seen on ultrasound several years ago. Polyps were not seen on the CT scan.  Abdominal hernia/diastasis recti. -- not seen on CT scan.  RUQ pain -- still having pain, further imaging may help.  Continued nausea and vomiting. -- has zofran but still having continued issues     Current Medication: Outpatient Encounter Medications as of 01/04/2023  Medication Sig   albuterol (VENTOLIN HFA) 108 (90 Base) MCG/ACT inhaler Inhale 1-2 puffs into the lungs every 6 (six) hours as needed for wheezing or shortness of breath.   ALPRAZolam (XANAX) 0.5 MG tablet Take 1 tablet (0.5 mg total) by mouth 2 (two) times daily as needed for anxiety.   ammonium lactate (LAC-HYDRIN) 12 % lotion Apply 1 application topically as needed for dry skin.   benzonatate (TESSALON) 100 MG capsule Take 1 capsule (100 mg total) by mouth 3 (three) times daily as needed.   cariprazine (VRAYLAR) 1.5 MG capsule Take 1 capsule (1.5 mg total) by mouth daily.   Cholecalciferol 25 MCG (1000 UT) tablet Take 1 tablet (1,000 Units total) by mouth once a week.   diclofenac Sodium (VOLTAREN) 1 % GEL SMARTSIG:2-4 Gram(s) Topical 1-4 Times Daily PRN   FARXIGA 10 MG TABS tablet TAKE 1 TABLET BY MOUTH EVERY DAY   FLUoxetine (PROZAC) 40 MG capsule Take 1 capsule (40 mg total) by mouth daily.   fluticasone (FLONASE) 50 MCG/ACT nasal spray Place 2 sprays into both nostrils daily.   glucose blood (ONETOUCH VERIO) test strip Use 1 test strip to check glucose level three times daily and prn   HYDROmorphone (DILAUDID) 4 MG tablet  Take 4 mg by mouth 4 (four) times daily as needed.   ibuprofen (ADVIL) 800 MG tablet Take 1 tablet (800 mg total) by mouth every 6 (six) hours as needed.   insulin glargine, 2 Unit Dial, (TOUJEO MAX SOLOSTAR) 300 UNIT/ML Solostar Pen Inject 35 Units into the skin daily.   Insulin Pen Needle 31G X 5 MM MISC Use as directed  Once a day Dx e11.65   Ipratropium-Albuterol (COMBIVENT RESPIMAT) 20-100 MCG/ACT AERS respimat Inhale 1 puff into the lungs every 6 (six) hours as needed for wheezing.   Lancets (ONETOUCH DELICA PLUS [id number]) MISC Use 1 lancet to check glucose three times daily and prn   levothyroxine (SYNTHROID) 50 MCG tablet TAKE 1 TABLET BY MOUTH EVERY DAY   methocarbamol (ROBAXIN) 500 MG tablet PLEASE SEE ATTACHED FOR DETAILED DIRECTIONS   Methylnaltrexone Bromide (RELISTOR) 150 MG TABS Take 3 tablets (450 mg total) by mouth daily as needed (constipation).   morphine (MS CONTIN) 60 MG 12 hr tablet Take 60 mg by mouth every 12 (twelve) hours.   Multiple Vitamin (MULTI-VITAMIN) tablet Take 1 tablet by mouth daily.   nystatin (MYCOSTATIN/NYSTOP) powder Apply 1 application. topically 3 (three) times daily.   ondansetron (ZOFRAN-ODT) 4 MG disintegrating tablet Take 1 tablet (4 mg total) by mouth every 8 (eight) hours as needed for nausea or vomiting.   pantoprazole (PROTONIX) 40 MG tablet Take 1 tablet (40 mg total) by mouth daily.   pravastatin (  PRAVACHOL) 10 MG tablet TAKE 1 TABLET BY MOUTH EVERY DAY   pregabalin (LYRICA) 150 MG capsule Take 150 mg by mouth 3 (three) times daily.    promethazine (PHENERGAN) 25 MG tablet Take 1 tablet (25 mg total) by mouth every 8 (eight) hours as needed for nausea or vomiting.   rOPINIRole (REQUIP) 0.25 MG tablet Take 1-2 tabs at night for restless legs   Semaglutide, 2 MG/DOSE, 8 MG/3ML SOPN Inject 2 mg as directed once a week.   traZODone (DESYREL) 50 MG tablet Take 0.5-1 tablets (25-50 mg total) by mouth at bedtime as needed for sleep.   Vitamin D,  Ergocalciferol, (DRISDOL) 1.25 MG (50000 UNIT) CAPS capsule Take 1 capsule (50,000 Units total) by mouth every 7 (seven) days.   No facility-administered encounter medications on file as of 01/04/2023.    Surgical History: Past Surgical History:  Procedure Laterality Date   ABDOMINAL HYSTERECTOMY     CARPAL TUNNEL RELEASE     FOOT TENDON SURGERY Right    KNEE SURGERY     SPINAL FUSION     SPINAL FUSION      Medical History: Past Medical History:  Diagnosis Date   Actinic keratitis    Allergy    Anxiety    Asthma    Basal cell carcinoma    Chronic venous insufficiency    DDD (degenerative disc disease), lumbar    Diabetes mellitus without complication (HCC)    Fibromyalgia    Fibromyalgia    Hyperlipidemia    Lymphedema    Melanoma ([place])    Migraine    Mitral valve prolapse    Tendonitis of foot    left    Family History: Family History  Problem Relation Age of Onset   Cancer Paternal Aunt    Cancer Paternal Uncle    Diabetes Paternal Grandfather    Heart disease Paternal Grandfather     Social History   Socioeconomic History   Marital status: Divorced    Spouse name: Not on file   Number of children: Not on file   Years of education: Not on file   Highest education level: Not on file  Occupational History   Not on file  Tobacco Use   Smoking status: Never   Smokeless tobacco: Never  Vaping Use   Vaping Use: Never used  Substance and Sexual Activity   Alcohol use: No   Drug use: No   Sexual activity: Not on file  Other Topics Concern   Not on file  Social History Narrative   Not on file   Social Determinants of Health   Financial Resource Strain: Not on file  Food Insecurity: Not on file  Transportation Needs: Not on file  Physical Activity: Not on file  Stress: Not on file  Social Connections: Not on file  Intimate Partner Violence: Not on file      Review of Systems  Constitutional:  Positive for fatigue.  HENT: Negative.     Respiratory:  Negative for cough, chest tightness, shortness of breath and wheezing.   Cardiovascular:  Negative for chest pain and palpitations.  Gastrointestinal:  Positive for abdominal distention, abdominal pain, diarrhea, nausea and vomiting.  Genitourinary:  Positive for dysuria, frequency, pelvic pain and urgency.  Musculoskeletal:  Positive for back pain.  Neurological: Negative.     Vital Signs: BP 118/68   Pulse 79   Temp 97.7 F (36.5 C)   Resp 16   Ht '5\' 1"'$  (1.549 m)  Wt 164 lb 9.6 oz (74.7 kg)   LMP 12/13/1991   SpO2 97%   BMI 31.10 kg/m    Physical Exam Vitals reviewed.  Constitutional:      Appearance: Normal appearance. She is obese.  HENT:     Head: Normocephalic and atraumatic.  Eyes:     Pupils: Pupils are equal, round, and reactive to light.  Pulmonary:     Effort: Pulmonary effort is normal. No respiratory distress.  Abdominal:     General: Bowel sounds are normal. There is distension.     Palpations: Abdomen is soft.     Tenderness: There is abdominal tenderness. There is guarding.  Neurological:     Mental Status: She is alert and oriented to person, place, and time.  Psychiatric:        Mood and Affect: Mood normal.        Behavior: Behavior normal.        Assessment/Plan: 1. Gallbladder polyp Referred to GI and RUQ ultrasound ordered.  - US Abdomen Limited RUQ (LIVER/GB); Future - Ambulatory referral to Gastroenterology  2. Right upper quadrant abdominal pain Ultrasound ordered, referred to GI - US Abdomen Limited RUQ (LIVER/GB); Future - Ambulatory referral to Gastroenterology  3. Bilious vomiting with nausea Ultrasound ordered for further evaluation and referred to GI - US Abdomen Limited RUQ (LIVER/GB); Future - Ambulatory referral to Gastroenterology  4. Recurrent abdominal hernia without obstruction or gangrene, unspecified hernia type Referred to GI - Ambulatory referral to Gastroenterology   General Counseling:  [name] [name] verbalizes understanding of the findings of todays visit and agrees with plan of treatment. I have discussed any further diagnostic evaluation that may be needed or ordered today. We also reviewed her medications today. she has been encouraged to call the office with any questions or concerns that should arise related to todays visit.    Orders Placed This Encounter  Procedures   US Abdomen Limited RUQ (LIVER/GB)   Ambulatory referral to Gastroenterology    No orders of the defined types were placed in this encounter.   Return in about 3 weeks (around 01/25/2023) for F/U, Ultrasound.   Total time spent:30 Minutes Time spent includes review of chart, medications, test results, and follow up plan with the patient.   [place] Controlled Substance Database was reviewed by me.  This patient was seen by Jonetta Osgood, FNP-C in collaboration with Dr. Clayborn Bigness as a part of collaborative care agreement.   Bertie Simien R. Valetta Fuller, MSN, FNP-C Internal medicine

## 2023-01-05 ENCOUNTER — Telehealth: Payer: Self-pay

## 2023-01-05 NOTE — Telephone Encounter (Signed)
PA was done for Relistor.

## 2023-01-06 ENCOUNTER — Encounter: Payer: Self-pay | Admitting: Nurse Practitioner

## 2023-01-08 ENCOUNTER — Telehealth: Payer: Self-pay

## 2023-01-08 NOTE — Telephone Encounter (Signed)
Left message for patient to give office a call.  

## 2023-01-09 ENCOUNTER — Telehealth: Payer: Self-pay | Admitting: Nurse Practitioner

## 2023-01-09 NOTE — Telephone Encounter (Signed)
S/w patient regarding Neurology. Gave her [facility] telephone # for her to schedule her appointment-Toni

## 2023-01-10 ENCOUNTER — Ambulatory Visit: Payer: Medicare Other

## 2023-01-15 ENCOUNTER — Telehealth: Payer: Self-pay | Admitting: Nurse Practitioner

## 2023-01-15 NOTE — Telephone Encounter (Signed)
Neurology appointment>> 02/09/23 with Dr. Manuella Ghazi @ [facility] [place]

## 2023-01-16 ENCOUNTER — Ambulatory Visit
Admission: RE | Admit: 2023-01-16 | Discharge: 2023-01-16 | Disposition: A | Discharge status: Home / Self Care | Payer: Medicare Other | Source: Ambulatory Visit | Attending: Nurse Practitioner | Admitting: Nurse Practitioner

## 2023-01-16 ENCOUNTER — Ambulatory Visit: Payer: Medicare Other | Admitting: Nurse Practitioner

## 2023-01-16 ENCOUNTER — Other Ambulatory Visit: Payer: Self-pay | Admitting: Nurse Practitioner

## 2023-01-16 DIAGNOSIS — R1011 Right upper quadrant pain: Secondary | ICD-10-CM | POA: Insufficient documentation

## 2023-01-16 DIAGNOSIS — K824 Cholesterolosis of gallbladder: Secondary | ICD-10-CM | POA: Insufficient documentation

## 2023-01-16 DIAGNOSIS — R1114 Bilious vomiting: Secondary | ICD-10-CM | POA: Insufficient documentation

## 2023-01-16 MED ORDER — ONDANSETRON 4 MG PO TBDP: 4.0000 mg | ORAL_TABLET | Freq: Three times a day (TID) | ORAL | 0 refills | Status: DC | PRN

## 2023-01-22 ENCOUNTER — Ambulatory Visit: Payer: Medicare Other | Admitting: Nurse Practitioner

## 2023-01-30 ENCOUNTER — Ambulatory Visit: Payer: Medicare Other | Admitting: Nurse Practitioner

## 2023-01-30 DIAGNOSIS — G894 Chronic pain syndrome: Secondary | ICD-10-CM | POA: Diagnosis not present

## 2023-01-30 DIAGNOSIS — M797 Fibromyalgia: Secondary | ICD-10-CM | POA: Diagnosis not present

## 2023-01-30 DIAGNOSIS — M25562 Pain in left knee: Secondary | ICD-10-CM | POA: Diagnosis not present

## 2023-01-30 DIAGNOSIS — M62838 Other muscle spasm: Secondary | ICD-10-CM | POA: Diagnosis not present

## 2023-01-30 DIAGNOSIS — M545 Low back pain, unspecified: Secondary | ICD-10-CM | POA: Diagnosis not present

## 2023-01-30 DIAGNOSIS — M5431 Sciatica, right side: Secondary | ICD-10-CM | POA: Diagnosis not present

## 2023-01-31 ENCOUNTER — Ambulatory Visit: Payer: Medicare Other | Admitting: Nurse Practitioner

## 2023-02-01 ENCOUNTER — Ambulatory Visit: Payer: Medicare Other | Admitting: Nurse Practitioner

## 2023-02-09 DIAGNOSIS — Z8673 Personal history of transient ischemic attack (TIA), and cerebral infarction without residual deficits: Secondary | ICD-10-CM | POA: Diagnosis not present

## 2023-02-09 DIAGNOSIS — E1165 Type 2 diabetes mellitus with hyperglycemia: Secondary | ICD-10-CM | POA: Diagnosis not present

## 2023-02-09 DIAGNOSIS — G8929 Other chronic pain: Secondary | ICD-10-CM | POA: Diagnosis not present

## 2023-02-09 DIAGNOSIS — M797 Fibromyalgia: Secondary | ICD-10-CM | POA: Diagnosis not present

## 2023-02-16 ENCOUNTER — Encounter: Payer: Self-pay | Admitting: Nurse Practitioner

## 2023-02-16 ENCOUNTER — Ambulatory Visit (INDEPENDENT_AMBULATORY_CARE_PROVIDER_SITE_OTHER): Payer: Medicare Other | Admitting: Nurse Practitioner

## 2023-02-16 VITALS — BP 110/65 | HR 91 | Temp 97.8°F | Resp 16 | Ht 61.0 in | Wt 155.6 lb

## 2023-02-16 DIAGNOSIS — K5903 Drug induced constipation: Secondary | ICD-10-CM | POA: Diagnosis not present

## 2023-02-16 DIAGNOSIS — Z1212 Encounter for screening for malignant neoplasm of rectum: Secondary | ICD-10-CM | POA: Diagnosis not present

## 2023-02-16 DIAGNOSIS — E782 Mixed hyperlipidemia: Secondary | ICD-10-CM

## 2023-02-16 DIAGNOSIS — R300 Dysuria: Secondary | ICD-10-CM

## 2023-02-16 DIAGNOSIS — Z0001 Encounter for general adult medical examination with abnormal findings: Secondary | ICD-10-CM

## 2023-02-16 DIAGNOSIS — E538 Deficiency of other specified B group vitamins: Secondary | ICD-10-CM

## 2023-02-16 DIAGNOSIS — E039 Hypothyroidism, unspecified: Secondary | ICD-10-CM

## 2023-02-16 DIAGNOSIS — Z1211 Encounter for screening for malignant neoplasm of colon: Secondary | ICD-10-CM | POA: Diagnosis not present

## 2023-02-16 DIAGNOSIS — Z1231 Encounter for screening mammogram for malignant neoplasm of breast: Secondary | ICD-10-CM

## 2023-02-16 DIAGNOSIS — E1165 Type 2 diabetes mellitus with hyperglycemia: Secondary | ICD-10-CM

## 2023-02-16 DIAGNOSIS — E559 Vitamin D deficiency, unspecified: Secondary | ICD-10-CM

## 2023-02-16 DIAGNOSIS — F16283 Hallucinogen dependence with hallucinogen persisting perception disorder (flashbacks): Secondary | ICD-10-CM

## 2023-02-16 MED ORDER — ALPRAZOLAM 0.5 MG PO TABS: 0.5000 mg | ORAL_TABLET | Freq: Every evening | ORAL | 2 refills | Status: DC | PRN

## 2023-02-16 MED ORDER — CARIPRAZINE HCL 3 MG PO CAPS: 3.0000 mg | ORAL_CAPSULE | Freq: Every day | ORAL | 5 refills | Status: DC

## 2023-02-16 MED ORDER — RELISTOR 150 MG PO TABS: 450.0000 mg | ORAL_TABLET | Freq: Every day | ORAL | 1 refills | Status: DC | PRN

## 2023-02-16 NOTE — Progress Notes (Signed)
[facility] [place], [place] [address]  Internal MEDICINE  Office Visit Note  Patient Name: [name] [name]  [id number]  [id number]  Date of Service: 02/16/2023  Chief Complaint  Patient presents with   Hyperlipidemia   Diabetes   Medicare Wellness    HPI Nayleen presents for an annual well visit and physical exam.  Well-appearing 47 y.o. female with hypertension, COPD, asthma, diabetes, chronic pain, fibromyalgia, high cholesterol, anxiety, depression and flashbacks. Routine CRC screening: opted for cologuard  Routine mammogram: due now  Pap smear: due now  Labs: due for routine labs New or worsening pain: chronic pain Other concerns: having more flashbacks, wants to increase vraylar dose if possible Gi symptoms have died down some. Waiting to hear from gastroenterology to schedule appt.  Seen by neurology, was told that she most likely does not have MS.  --discussed RUQ [place] -- has stable 4 mm gallbladder polyp and hepatic steatosis       02/16/2023    8:55 AM 01/30/2022   10:32 AM 10/04/2020    3:45 PM  MMSE - Mini Mental State Exam  Orientation to time '5 5 5  '$ Orientation to Place '5 5 5  '$ Registration '3 3 3  '$ Attention/ Calculation '5 5 5  '$ Recall '3 3 3  '$ Language- name 2 objects '2 2 2  '$ Language- repeat '1 1 1  '$ Language- follow 3 step command '3 3 3  '$ Language- read & follow direction '1 1 1  '$ Write a sentence '1 1 1  '$ Copy design '1 1 1  '$ Total score '30 30 30    '$ Functional Status Survey: Is the patient deaf or have difficulty hearing?: No Does the patient have difficulty seeing, even when wearing glasses/contacts?: No Does the patient have difficulty concentrating, remembering, or making decisions?: No Does the patient have difficulty walking or climbing stairs?: Yes Does the patient have difficulty dressing or bathing?: No Does the patient have difficulty doing errands alone such as visiting a doctor's office or shopping?: No     07/13/2022     3:09 PM 09/08/2022    9:53 AM 10/18/2022    2:43 PM 01/04/2023    3:57 PM 02/16/2023    8:54 AM  [place] in the past year?  '1 1 1 '$ 0  Was there an injury with Fall?  0 0 1 0  Fall Risk Category Calculator  '2 2 2 '$ 0  Fall Risk Category (Retired)  Moderate Moderate    (RETIRED) Patient Fall Risk Level Low fall risk Moderate fall risk Moderate fall risk    Patient at Risk for Falls Due to  Impaired balance/gait   No Fall Risks  Fall risk Follow up  Falls evaluation completed Falls evaluation completed  Falls evaluation completed       07/04/2022   10:57 AM  Depression screen PHQ 2/9  Decreased Interest 0  Down, Depressed, Hopeless 0  PHQ - 2 Score 0       Current Medication: Outpatient Encounter Medications as of 02/16/2023  Medication Sig   albuterol (VENTOLIN HFA) 108 (90 Base) MCG/ACT inhaler Inhale 1-2 puffs into the lungs every 6 (six) hours as needed for wheezing or shortness of breath.   ammonium lactate (LAC-HYDRIN) 12 % lotion Apply 1 application topically as needed for dry skin.   benzonatate (TESSALON) 100 MG capsule Take 1 capsule (100 mg total) by mouth 3 (three) times daily as needed.   cariprazine (VRAYLAR) 3 MG capsule Take  1 capsule (3 mg total) by mouth daily.   Cholecalciferol 25 MCG (1000 UT) tablet Take 1 tablet (1,000 Units total) by mouth once a week.   diclofenac Sodium (VOLTAREN) 1 % GEL SMARTSIG:2-4 Gram(s) Topical 1-4 Times Daily PRN   FARXIGA 10 MG TABS tablet TAKE 1 TABLET BY MOUTH EVERY DAY   FLUoxetine (PROZAC) 40 MG capsule Take 1 capsule (40 mg total) by mouth daily.   fluticasone (FLONASE) 50 MCG/ACT nasal spray Place 2 sprays into both nostrils daily.   glucose blood (ONETOUCH VERIO) test strip Use 1 test strip to check glucose level three times daily and prn   HYDROmorphone (DILAUDID) 4 MG tablet Take 4 mg by mouth 4 (four) times daily as needed.   ibuprofen (ADVIL) 800 MG tablet Take 1 tablet (800 mg total) by mouth every 6 (six) hours as  needed.   insulin glargine, 2 Unit Dial, (TOUJEO MAX SOLOSTAR) 300 UNIT/ML Solostar Pen Inject 35 Units into the skin daily.   Insulin Pen Needle 31G X 5 MM MISC Use as directed  Once a day Dx e11.65   Ipratropium-Albuterol (COMBIVENT RESPIMAT) 20-100 MCG/ACT AERS respimat Inhale 1 puff into the lungs every 6 (six) hours as needed for wheezing.   Lancets (ONETOUCH DELICA PLUS 123XX123) MISC Use 1 lancet to check glucose three times daily and prn   levothyroxine (SYNTHROID) 50 MCG tablet TAKE 1 TABLET BY MOUTH EVERY DAY   methocarbamol (ROBAXIN) 500 MG tablet PLEASE SEE ATTACHED FOR DETAILED DIRECTIONS   morphine (MS CONTIN) 60 MG 12 hr tablet Take 60 mg by mouth every 12 (twelve) hours.   Multiple Vitamin (MULTI-VITAMIN) tablet Take 1 tablet by mouth daily.   nystatin (MYCOSTATIN/NYSTOP) powder Apply 1 application. topically 3 (three) times daily.   ondansetron (ZOFRAN-ODT) 4 MG disintegrating tablet Take 1 tablet (4 mg total) by mouth every 8 (eight) hours as needed for nausea or vomiting.   pantoprazole (PROTONIX) 40 MG tablet Take 1 tablet (40 mg total) by mouth daily.   pravastatin (PRAVACHOL) 10 MG tablet TAKE 1 TABLET BY MOUTH EVERY DAY   pregabalin (LYRICA) 150 MG capsule Take 150 mg by mouth 3 (three) times daily.    promethazine (PHENERGAN) 25 MG tablet Take 1 tablet (25 mg total) by mouth every 8 (eight) hours as needed for nausea or vomiting.   rOPINIRole (REQUIP) 0.25 MG tablet Take 1-2 tabs at night for restless legs   Semaglutide, 2 MG/DOSE, 8 MG/3ML SOPN Inject 2 mg as directed once a week.   traZODone (DESYREL) 50 MG tablet Take 0.5-1 tablets (25-50 mg total) by mouth at bedtime as needed for sleep.   Vitamin D, Ergocalciferol, (DRISDOL) 1.25 MG (50000 UNIT) CAPS capsule Take 1 capsule (50,000 Units total) by mouth every 7 (seven) days.   [DISCONTINUED] ALPRAZolam (XANAX) 0.5 MG tablet Take 1 tablet (0.5 mg total) by mouth 2 (two) times daily as needed for anxiety.    [DISCONTINUED] cariprazine (VRAYLAR) 1.5 MG capsule Take 1 capsule (1.5 mg total) by mouth daily.   [DISCONTINUED] Methylnaltrexone Bromide (RELISTOR) 150 MG TABS Take 3 tablets (450 mg total) by mouth daily as needed (constipation).   ALPRAZolam (XANAX) 0.5 MG tablet Take 1 tablet (0.5 mg total) by mouth at bedtime as needed for anxiety.   Methylnaltrexone Bromide (RELISTOR) 150 MG TABS Take 3 tablets (450 mg total) by mouth daily as needed (constipation).   No facility-administered encounter medications on file as of 02/16/2023.    Surgical History: Past Surgical History:  Procedure  Laterality Date   ABDOMINAL HYSTERECTOMY     CARPAL TUNNEL RELEASE     FOOT TENDON SURGERY Right    KNEE SURGERY     SPINAL FUSION     SPINAL FUSION      Medical History: Past Medical History:  Diagnosis Date   Actinic keratitis    Allergy    Anxiety    Asthma    Basal cell carcinoma    Chronic venous insufficiency    DDD (degenerative disc disease), lumbar    Diabetes mellitus without complication (HCC)    Fibromyalgia    Fibromyalgia    Hyperlipidemia    Lymphedema    Melanoma ([place])    Migraine    Mitral valve prolapse    Tendonitis of foot    left    Family History: Family History  Problem Relation Age of Onset   Cancer Paternal Aunt    Cancer Paternal Uncle    Diabetes Paternal Grandfather    Heart disease Paternal Grandfather     Social History   Socioeconomic History   Marital status: Divorced    Spouse name: Not on file   Number of children: Not on file   Years of education: Not on file   Highest education level: Not on file  Occupational History   Not on file  Tobacco Use   Smoking status: Never   Smokeless tobacco: Never  Vaping Use   Vaping Use: Never used  Substance and Sexual Activity   Alcohol use: No   Drug use: No   Sexual activity: Not on file  Other Topics Concern   Not on file  Social History Narrative   Not on file   Social Determinants of Health    Financial Resource Strain: Not on file  Food Insecurity: Not on file  Transportation Needs: Not on file  Physical Activity: Not on file  Stress: Not on file  Social Connections: Not on file  Intimate Partner Violence: Not on file      Review of Systems  Constitutional:  Negative for activity change, appetite change, chills, fatigue, fever and unexpected weight change.  HENT: Negative.  Negative for congestion, ear pain, rhinorrhea, sore throat and trouble swallowing.   Eyes: Negative.   Respiratory: Negative.  Negative for cough, chest tightness, shortness of breath and wheezing.   Cardiovascular: Negative.  Negative for chest pain and palpitations.  Gastrointestinal:  Positive for diarrhea, nausea and vomiting. Negative for abdominal pain, blood in stool and constipation.  Endocrine: Negative.   Genitourinary: Negative.  Negative for difficulty urinating, dysuria, frequency, hematuria and urgency.  Musculoskeletal:  Positive for arthralgias and back pain. Negative for joint swelling, myalgias and neck pain.  Skin: Negative.  Negative for rash and wound.  Allergic/Immunologic: Negative.  Negative for immunocompromised state.  Neurological: Negative.  Negative for dizziness, seizures, numbness and headaches.  Hematological: Negative.   Psychiatric/Behavioral:  Positive for behavioral problems, dysphoric mood and sleep disturbance. Negative for self-injury and suicidal ideas. The patient is nervous/anxious.     Vital Signs: BP 110/65   Pulse 91   Temp 97.8 F (36.6 C)   Resp 16   Ht '5\' 1"'$  (1.549 m)   Wt 155 lb 9.6 oz (70.6 kg)   LMP 12/13/1991   SpO2 93%   BMI 29.40 kg/m    Physical Exam Vitals reviewed.  Constitutional:      General: She is awake. She is not in acute distress.    Appearance: Normal appearance. She is  well-developed, well-groomed and normal weight. She is not ill-appearing or diaphoretic.  HENT:     Head: Normocephalic and atraumatic.     Right Ear:  Tympanic membrane, ear canal and external ear normal.     Left Ear: Tympanic membrane, ear canal and external ear normal.     Nose: Nose normal. No congestion or rhinorrhea.     Mouth/Throat:     Lips: Pink.     Mouth: Mucous membranes are moist.     Pharynx: Oropharynx is clear. Uvula midline. No oropharyngeal exudate or posterior oropharyngeal erythema.  Eyes:     General: Lids are normal. Vision grossly intact. Gaze aligned appropriately. No scleral icterus.       Right eye: No discharge.        Left eye: No discharge.     Extraocular Movements: Extraocular movements intact.     Conjunctiva/sclera: Conjunctivae normal.     Pupils: Pupils are equal, round, and reactive to light.     Funduscopic exam:    Right eye: Red reflex present.        Left eye: Red reflex present. Neck:     Thyroid: No thyromegaly.     Vascular: No JVD.     Trachea: Trachea and phonation normal. No tracheal deviation.  Cardiovascular:     Rate and Rhythm: Normal rate and regular rhythm.     Pulses: Normal pulses.          Dorsalis pedis pulses are 2+ on the right side and 2+ on the left side.       Posterior tibial pulses are 2+ on the right side and 2+ on the left side.     Heart sounds: Normal heart sounds, S1 normal and S2 normal. No murmur heard.    No friction rub. No gallop.  Pulmonary:     Effort: Pulmonary effort is normal. No accessory muscle usage or respiratory distress.     Breath sounds: Normal breath sounds and air entry. No stridor. No wheezing or rales.  Chest:     Chest wall: No tenderness.     Comments: Declined clinical breast exam, mammogram ordered.  Abdominal:     General: Bowel sounds are normal. There is no distension.     Palpations: Abdomen is soft. There is no mass.     Tenderness: There is no abdominal tenderness. There is no guarding or rebound.  Musculoskeletal:        General: No tenderness or deformity. Normal range of motion.     Cervical back: Normal range of motion  and neck supple.     Right lower leg: No edema.     Left lower leg: No edema.     Right foot: Normal range of motion. No deformity, bunion, Charcot foot, foot drop or prominent metatarsal heads.     Left foot: Normal range of motion. No deformity, bunion, Charcot foot, foot drop or prominent metatarsal heads.  Feet:     Right foot:     Protective Sensation: 6 sites tested.  6 sites sensed.     Skin integrity: Callus present. No ulcer, blister, skin breakdown, erythema, warmth, dry skin or fissure.     Toenail Condition: Right toenails are abnormally thick.     Left foot:     Protective Sensation: 6 sites tested.  6 sites sensed.     Skin integrity: Callus present. No ulcer, blister, skin breakdown, erythema, warmth, dry skin or fissure.     Toenail Condition: Left toenails  are abnormally thick.  Lymphadenopathy:     Cervical: No cervical adenopathy.  Skin:    General: Skin is warm and dry.     Capillary Refill: Capillary refill takes less than 2 seconds.     Coloration: Skin is not pale.     Findings: No erythema or rash.  Neurological:     Mental Status: She is alert and oriented to person, place, and time.     Cranial Nerves: No cranial nerve deficit.     Motor: No abnormal muscle tone.     Coordination: Coordination normal.     Gait: Gait normal.     Deep Tendon Reflexes: Reflexes are normal and symmetric.  Psychiatric:        Attention and Perception: Attention normal.        Mood and Affect: Mood is depressed. Affect is tearful.        Speech: Speech is delayed.        Behavior: Behavior normal. Behavior is cooperative.        Thought Content: Thought content normal.        Judgment: Judgment normal.        Assessment/Plan: 1. Encounter for routine adult health examination with abnormal findings Age-appropriate preventive screenings and vaccinations discussed, annual physical exam completed. Routine labs for health maintenance ordered, see below. Refills ordered. PHM  updated.  - ALPRAZolam (XANAX) 0.5 MG tablet; Take 1 tablet (0.5 mg total) by mouth at bedtime as needed for anxiety.  Dispense: 30 tablet; Refill: 2 - CBC with Differential/Platelet - CMP14+EGFR - Lipid Profile - TSH + free T4 - Hgb A1C w/o eAG - B12 and Folate Panel - Vitamin D (25 hydroxy) - Urinalysis, Routine w reflex microscopic  2. Uncontrolled type 2 diabetes mellitus with hyperglycemia (HCC) Continue medications as prescribed, routine labs ordered - CBC with Differential/Platelet - CMP14+EGFR - Lipid Profile - TSH + free T4 - Hgb A1C w/o eAG - B12 and Folate Panel - Urine Microalbumin w/creat. ratio  3. Acquired hypothyroidism Routine labs ordered - Lipid Profile - TSH + free T4 - Hgb A1C w/o eAG  4. Therapeutic opioid induced constipation Continue relistor as prescibed - Methylnaltrexone Bromide (RELISTOR) 150 MG TABS; Take 3 tablets (450 mg total) by mouth daily as needed (constipation).  Dispense: 90 tablet; Refill: 1  5. Mixed hyperlipidemia Routine labs ordered - CMP14+EGFR - Lipid Profile - TSH + free T4 - Hgb A1C w/o eAG  6. B12 deficiency Routine labs ordered - CBC with Differential/Platelet - B12 and Folate Panel  7. Vitamin D deficiency Routine lab ordered - Vitamin D (25 hydroxy)  8. Dysuria Routine urinalysis done  - Urinalysis, Routine w reflex microscopic  9. Encounter for screening mammogram for malignant neoplasm of breast Routine mammogram ordered - MM 3D SCREENING MAMMOGRAM BILATERAL BREAST; Future  10. Screening for colorectal cancer Cologuard ordered - Cologuard  11. Flashbacks ([place]) Vraylar dose increased. - cariprazine (VRAYLAR) 3 MG capsule; Take 1 capsule (3 mg total) by mouth daily.  Dispense: 30 capsule; Refill: 5     General Counseling: Iveliz verbalizes understanding of the findings of todays visit and agrees with plan of treatment. I have discussed any further diagnostic evaluation that may be needed or ordered  today. We also reviewed her medications today. she has been encouraged to call the office with any questions or concerns that should arise related to todays visit.    Orders Placed This Encounter  Procedures   MM 3D SCREENING MAMMOGRAM BILATERAL  BREAST   Cologuard   CBC with Differential/Platelet   CMP14+EGFR   Lipid Profile   TSH + free T4   Hgb A1C w/o eAG   B12 and Folate Panel   Vitamin D (25 hydroxy)    Meds ordered this encounter  Medications   cariprazine (VRAYLAR) 3 MG capsule    Sig: Take 1 capsule (3 mg total) by mouth daily.    Dispense:  30 capsule    Refill:  5    Vraylar was recently approved. Note increased dose to 3 mg. Discontinue 1.5 mg dose.   ALPRAZolam (XANAX) 0.5 MG tablet    Sig: Take 1 tablet (0.5 mg total) by mouth at bedtime as needed for anxiety.    Dispense:  30 tablet    Refill:  2   Methylnaltrexone Bromide (RELISTOR) 150 MG TABS    Sig: Take 3 tablets (450 mg total) by mouth daily as needed (constipation).    Dispense:  90 tablet    Refill:  1    Return in about 3 months (around 05/19/2023) for F/U, anxiety med refill, Meridee Branum PCP with pap smear as well. .   Total time spent:30 Minutes Time spent includes review of chart, medications, test results, and follow up plan with the patient.   [place] Controlled Substance Database was reviewed by me.  This patient was seen by Jonetta Osgood, FNP-C in collaboration with Dr. Clayborn Bigness as a part of collaborative care agreement.  Darral Rishel R. Valetta Fuller, MSN, FNP-C Internal medicine

## 2023-02-18 ENCOUNTER — Encounter: Payer: Self-pay | Admitting: Nurse Practitioner

## 2023-02-26 MED ORDER — MECLIZINE HCL 25 MG PO TABS: 25.0000 mg | ORAL_TABLET | Freq: Three times a day (TID) | ORAL | 2 refills | Status: DC | PRN

## 2023-02-27 DIAGNOSIS — M5431 Sciatica, right side: Secondary | ICD-10-CM | POA: Diagnosis not present

## 2023-02-27 DIAGNOSIS — G894 Chronic pain syndrome: Secondary | ICD-10-CM | POA: Diagnosis not present

## 2023-02-27 DIAGNOSIS — Z79899 Other long term (current) drug therapy: Secondary | ICD-10-CM | POA: Diagnosis not present

## 2023-02-27 DIAGNOSIS — M62838 Other muscle spasm: Secondary | ICD-10-CM | POA: Diagnosis not present

## 2023-02-27 DIAGNOSIS — M545 Low back pain, unspecified: Secondary | ICD-10-CM | POA: Diagnosis not present

## 2023-02-27 DIAGNOSIS — M25562 Pain in left knee: Secondary | ICD-10-CM | POA: Diagnosis not present

## 2023-02-27 DIAGNOSIS — G8929 Other chronic pain: Secondary | ICD-10-CM | POA: Diagnosis not present

## 2023-02-27 DIAGNOSIS — M797 Fibromyalgia: Secondary | ICD-10-CM | POA: Diagnosis not present

## 2023-03-01 ENCOUNTER — Other Ambulatory Visit: Payer: Self-pay | Admitting: Nurse Practitioner

## 2023-03-01 DIAGNOSIS — M1712 Unilateral primary osteoarthritis, left knee: Secondary | ICD-10-CM | POA: Diagnosis not present

## 2023-03-01 DIAGNOSIS — Z79899 Other long term (current) drug therapy: Secondary | ICD-10-CM | POA: Diagnosis not present

## 2023-03-01 DIAGNOSIS — Z760 Encounter for issue of repeat prescription: Secondary | ICD-10-CM

## 2023-03-17 ENCOUNTER — Other Ambulatory Visit: Payer: Self-pay | Admitting: Nurse Practitioner

## 2023-03-17 DIAGNOSIS — Z760 Encounter for issue of repeat prescription: Secondary | ICD-10-CM

## 2023-03-21 ENCOUNTER — Encounter: Payer: Self-pay | Admitting: Nurse Practitioner

## 2023-03-21 DIAGNOSIS — Z0001 Encounter for general adult medical examination with abnormal findings: Secondary | ICD-10-CM

## 2023-03-21 DIAGNOSIS — Z760 Encounter for issue of repeat prescription: Secondary | ICD-10-CM

## 2023-03-22 DIAGNOSIS — E559 Vitamin D deficiency, unspecified: Secondary | ICD-10-CM | POA: Diagnosis not present

## 2023-03-22 DIAGNOSIS — E1165 Type 2 diabetes mellitus with hyperglycemia: Secondary | ICD-10-CM | POA: Diagnosis not present

## 2023-03-22 DIAGNOSIS — E782 Mixed hyperlipidemia: Secondary | ICD-10-CM | POA: Diagnosis not present

## 2023-03-22 DIAGNOSIS — Z0001 Encounter for general adult medical examination with abnormal findings: Secondary | ICD-10-CM | POA: Diagnosis not present

## 2023-03-23 LAB — CBC WITH DIFFERENTIAL/PLATELET
Basophils Absolute: 0.1 10*3/uL (ref 0.0–0.2)
Basos: 1 %
EOS (ABSOLUTE): 0.3 10*3/uL (ref 0.0–0.4)
Eos: 6 %
Hematocrit: 42.6 % (ref 34.0–46.6)
Hemoglobin: 14.2 g/dL (ref 11.1–15.9)
Immature Grans (Abs): 0 10*3/uL (ref 0.0–0.1)
Immature Granulocytes: 0 %
Lymphocytes Absolute: 2.2 10*3/uL (ref 0.7–3.1)
Lymphs: 43 %
MCH: 29.1 pg (ref 26.6–33.0)
MCHC: 33.3 g/dL (ref 31.5–35.7)
MCV: 87 fL (ref 79–97)
Monocytes Absolute: 0.3 10*3/uL (ref 0.1–0.9)
Monocytes: 6 %
Neutrophils Absolute: 2.3 10*3/uL (ref 1.4–7.0)
Neutrophils: 44 %
Platelets: 256 10*3/uL (ref 150–450)
RBC: 4.88 x10E6/uL (ref 3.77–5.28)
RDW: 13.2 % (ref 11.7–15.4)
WBC: 5.1 10*3/uL (ref 3.4–10.8)

## 2023-03-23 LAB — LIPID PANEL
Chol/HDL Ratio: 7 ratio — ABNORMAL HIGH (ref 0.0–4.4)
Cholesterol, Total: 231 mg/dL — ABNORMAL HIGH (ref 100–199)
HDL: 33 mg/dL — ABNORMAL LOW (ref >39)
LDL Chol Calc (NIH): 108 mg/dL — ABNORMAL HIGH (ref 0–99)
Triglycerides: 518 mg/dL — ABNORMAL HIGH (ref 0–149)
VLDL Cholesterol Cal: 90 mg/dL — ABNORMAL HIGH (ref 5–40)

## 2023-03-23 LAB — CMP14+EGFR
ALT: 14 IU/L (ref 0–32)
AST: 14 IU/L (ref 0–40)
Albumin/Globulin Ratio: 1.7 (ref 1.2–2.2)
Albumin: 4.1 g/dL (ref 3.9–4.9)
Alkaline Phosphatase: 93 IU/L (ref 44–121)
BUN/Creatinine Ratio: 20 (ref 9–23)
BUN: 16 mg/dL (ref 6–24)
Bilirubin Total: 0.5 mg/dL (ref 0.0–1.2)
CO2: 24 mmol/L (ref 20–29)
Calcium: 9.1 mg/dL (ref 8.7–10.2)
Chloride: 103 mmol/L (ref 96–106)
Creatinine, Ser: 0.82 mg/dL (ref 0.57–1.00)
Globulin, Total: 2.4 g/dL (ref 1.5–4.5)
Glucose: 118 mg/dL — ABNORMAL HIGH (ref 70–99)
Potassium: 4 mmol/L (ref 3.5–5.2)
Sodium: 141 mmol/L (ref 134–144)
Total Protein: 6.5 g/dL (ref 6.0–8.5)
eGFR: 88 mL/min/{1.73_m2} (ref >59)

## 2023-03-23 LAB — TSH+FREE T4
Free T4: 1.08 ng/dL (ref 0.82–1.77)
TSH: 0.652 u[IU]/mL (ref 0.450–4.500)

## 2023-03-23 LAB — VITAMIN D 25 HYDROXY (VIT D DEFICIENCY, FRACTURES): Vit D, 25-Hydroxy: 20.9 ng/mL — ABNORMAL LOW (ref 30.0–100.0)

## 2023-03-23 LAB — B12 AND FOLATE PANEL
Folate: 12.7 ng/mL (ref >3.0)
Vitamin B-12: 291 pg/mL (ref 232–1245)

## 2023-03-23 LAB — HGB A1C W/O EAG: Hgb A1c MFr Bld: 6.6 % — ABNORMAL HIGH (ref 4.8–5.6)

## 2023-03-23 MED ORDER — LEVOTHYROXINE SODIUM 50 MCG PO TABS
50.0000 ug | ORAL_TABLET | Freq: Every day | ORAL | 1 refills | Status: DC
Start: 2023-03-23 — End: 2023-10-15

## 2023-03-23 MED ORDER — ALPRAZOLAM 0.5 MG PO TABS
0.5000 mg | ORAL_TABLET | Freq: Every evening | ORAL | 2 refills | Status: DC | PRN
Start: 2023-03-23 — End: 2023-06-05

## 2023-03-26 ENCOUNTER — Other Ambulatory Visit: Payer: Self-pay | Admitting: Nurse Practitioner

## 2023-03-26 ENCOUNTER — Ambulatory Visit: Payer: Medicare Other | Admitting: Neurology

## 2023-03-26 DIAGNOSIS — Z760 Encounter for issue of repeat prescription: Secondary | ICD-10-CM

## 2023-03-26 NOTE — Telephone Encounter (Signed)
Please review I don't find in her med

## 2023-03-27 ENCOUNTER — Other Ambulatory Visit: Payer: Self-pay

## 2023-03-27 DIAGNOSIS — G8929 Other chronic pain: Secondary | ICD-10-CM | POA: Diagnosis not present

## 2023-03-27 DIAGNOSIS — M797 Fibromyalgia: Secondary | ICD-10-CM | POA: Diagnosis not present

## 2023-03-27 DIAGNOSIS — Z79899 Other long term (current) drug therapy: Secondary | ICD-10-CM | POA: Diagnosis not present

## 2023-03-27 DIAGNOSIS — M545 Low back pain, unspecified: Secondary | ICD-10-CM | POA: Diagnosis not present

## 2023-03-27 DIAGNOSIS — M25562 Pain in left knee: Secondary | ICD-10-CM | POA: Diagnosis not present

## 2023-03-27 DIAGNOSIS — M62838 Other muscle spasm: Secondary | ICD-10-CM | POA: Diagnosis not present

## 2023-03-27 DIAGNOSIS — M5431 Sciatica, right side: Secondary | ICD-10-CM | POA: Diagnosis not present

## 2023-03-29 ENCOUNTER — Encounter: Payer: Self-pay | Admitting: Nurse Practitioner

## 2023-03-30 DIAGNOSIS — Z1212 Encounter for screening for malignant neoplasm of rectum: Secondary | ICD-10-CM | POA: Diagnosis not present

## 2023-03-30 DIAGNOSIS — Z1211 Encounter for screening for malignant neoplasm of colon: Secondary | ICD-10-CM | POA: Diagnosis not present

## 2023-04-04 ENCOUNTER — Encounter: Payer: Self-pay | Admitting: Nurse Practitioner

## 2023-04-08 ENCOUNTER — Encounter: Payer: Self-pay | Admitting: Nurse Practitioner

## 2023-04-08 DIAGNOSIS — R195 Other fecal abnormalities: Secondary | ICD-10-CM

## 2023-04-08 LAB — COLOGUARD: COLOGUARD: POSITIVE — AB

## 2023-04-11 ENCOUNTER — Telehealth: Payer: Self-pay

## 2023-04-11 NOTE — Telephone Encounter (Signed)
Pt left vm message to schedule colonoscopy has positive colorgard test please return call

## 2023-04-17 ENCOUNTER — Other Ambulatory Visit: Payer: Self-pay

## 2023-04-17 DIAGNOSIS — G459 Transient cerebral ischemic attack, unspecified: Secondary | ICD-10-CM | POA: Insufficient documentation

## 2023-04-18 ENCOUNTER — Other Ambulatory Visit: Payer: Self-pay

## 2023-04-18 ENCOUNTER — Ambulatory Visit (INDEPENDENT_AMBULATORY_CARE_PROVIDER_SITE_OTHER): Payer: Medicare Other | Admitting: Physician Assistant

## 2023-04-18 ENCOUNTER — Encounter: Payer: Self-pay | Admitting: Physician Assistant

## 2023-04-18 VITALS — BP 105/72 | HR 91 | Temp 98.4°F | Ht 61.0 in | Wt 154.0 lb

## 2023-04-18 DIAGNOSIS — R112 Nausea with vomiting, unspecified: Secondary | ICD-10-CM | POA: Diagnosis not present

## 2023-04-18 DIAGNOSIS — K5909 Other constipation: Secondary | ICD-10-CM

## 2023-04-18 DIAGNOSIS — R1084 Generalized abdominal pain: Secondary | ICD-10-CM

## 2023-04-18 DIAGNOSIS — R195 Other fecal abnormalities: Secondary | ICD-10-CM

## 2023-04-18 MED ORDER — PEG 3350-KCL-NA BICARB-NACL 420 G PO SOLR: 4000.0000 mL | Freq: Once | ORAL | 0 refills | Status: AC

## 2023-04-18 NOTE — Patient Instructions (Signed)

## 2023-04-18 NOTE — Progress Notes (Signed)
Celso Amy, PA-C [address]  [address]  [place], [place] [address]  Main: [phone]  Fax: [phone]   Gastroenterology Consultation  Referring Provider:     Sallyanne Kuster, NP Primary Care Physician:  Sallyanne Kuster, NP Primary Gastroenterologist:  Dr. Wyline Mood / Celso Amy, PA-C  Reason for Consultation:     Positive Cologuard        HPI:   [name] [name] is a 47 y.o. y/o female referred for consultation & management  by Sallyanne Kuster, NP.    Patient had recent positive screening Cologuard test.  Never had a colonoscopy.  No Family history of colon cancer.  GI symptoms: Upper Abdominal pain, nausea, vomiting, abdominal pain, Chronic Constipation.  She has pain located in the epigastrium.  Also has lower abdominal cramping when she is constipated.  Tried MiraLAX and prune juice in the past with little benefit.  Multiple OTC laxatives have not helped.  She has not tried prescription treatment for constipation.  She denies rectal bleeding or unintentional weight loss.  Abdominal pelvic CT done 12/2022, to evaluate abdominal pain and diarrhea, showed no abnormality.  There was moderate formed stool throughout the colon consistent with constipation.  Incidental diverticulosis with no diverticulitis.    RUQ ultrasound 01/2023 showed stable 4 mm gallbladder polyp with no change in many years.  Mild hepatic steatosis.  No gallstones.  Normal common bile duct 3 mm.  Labs 03/22/2023 showed Normal CBC, CMP, and TSH.  EGD done 06/2005 by Dr. Julio Alm at [facility] was normal.  Past Medical History:  Diagnosis Date   Actinic keratitis    Allergy    Anxiety    Asthma    Basal cell carcinoma    Chronic venous insufficiency    DDD (degenerative disc disease), lumbar    Diabetes mellitus without complication (HCC)    Fibromyalgia    Fibromyalgia    Hyperlipidemia    Lymphedema    Melanoma (HCC)    Migraine    Mitral valve prolapse    Tendonitis of  foot    left    Past Surgical History:  Procedure Laterality Date   ABDOMINAL HYSTERECTOMY     CARPAL TUNNEL RELEASE     FOOT TENDON SURGERY Right    KNEE SURGERY     SPINAL FUSION     SPINAL FUSION      Prior to Admission medications   Medication Sig Start Date End Date Taking? Authorizing Provider  albuterol (VENTOLIN HFA) 108 (90 Base) MCG/ACT inhaler Inhale 1-2 puffs into the lungs every 6 (six) hours as needed for wheezing or shortness of breath. 10/30/22   Margaretann Loveless, PA-C  ALPRAZolam Prudy Feeler) 0.5 MG tablet Take 1 tablet (0.5 mg total) by mouth at bedtime as needed for anxiety. 03/23/23   Sallyanne Kuster, NP  ammonium lactate (LAC-HYDRIN) 12 % lotion Apply 1 application topically as needed for dry skin. 07/06/20   Johnna Acosta, NP  benzonatate (TESSALON) 100 MG capsule Take 1 capsule (100 mg total) by mouth 3 (three) times daily as needed. 10/30/22   Margaretann Loveless, PA-C  cariprazine (VRAYLAR) 3 MG capsule Take 1 capsule (3 mg total) by mouth daily. 02/16/23   Sallyanne Kuster, NP  Cholecalciferol 25 MCG (1000 UT) tablet Take 1 tablet (1,000 Units total) by mouth once a week. 01/17/21   McDonough, Lauren K, PA-C  COMBIVENT RESPIMAT 20-100 MCG/ACT AERS respimat INHALE 1 PUFF INTO THE LUNGS EVERY 6 HOURS AS  NEEDED FOR WHEEZING 03/27/23   Sallyanne Kuster, NP  diclofenac Sodium (VOLTAREN) 1 % GEL SMARTSIG:2-4 Gram(s) Topical 1-4 Times Daily PRN 08/29/21   [provider]  FARXIGA 10 MG TABS tablet TAKE 1 TABLET(10 MG) BY MOUTH DAILY 03/19/23   Sallyanne Kuster, NP  FLUoxetine (PROZAC) 40 MG capsule Take 1 capsule (40 mg total) by mouth daily. 12/25/22   Sallyanne Kuster, NP  fluticasone (FLONASE) 50 MCG/ACT nasal spray Place 2 sprays into both nostrils daily. 10/30/22   Margaretann Loveless, PA-C  glucose blood (ONETOUCH VERIO) test strip Use 1 test strip to check glucose level three times daily and prn 09/08/22   Sallyanne Kuster, NP  HYDROmorphone (DILAUDID) 4  MG tablet Take 4 mg by mouth 4 (four) times daily as needed. 07/10/22   [provider]  ibuprofen (ADVIL) 800 MG tablet TAKE 1 TABLET(800 MG) BY MOUTH EVERY 6 HOURS AS NEEDED 03/01/23   Abernathy, Arlyss Repress, NP  insulin glargine, 2 Unit Dial, (TOUJEO MAX SOLOSTAR) 300 UNIT/ML Solostar Pen Inject 35 Units into the skin daily. 01/30/22   Sallyanne Kuster, NP  Insulin Pen Needle 31G X 5 MM MISC Use as directed  Once a day Dx e11.65 01/30/22   Sallyanne Kuster, NP  Lancets (ONETOUCH DELICA PLUS LANCET33G) MISC Use 1 lancet to check glucose three times daily and prn 09/08/22   Sallyanne Kuster, NP  levothyroxine (SYNTHROID) 50 MCG tablet Take 1 tablet (50 mcg total) by mouth daily. 03/23/23   Sallyanne Kuster, NP  meclizine (ANTIVERT) 25 MG tablet Take 1 tablet (25 mg total) by mouth 3 (three) times daily as needed for dizziness. 02/26/23   Sallyanne Kuster, NP  methocarbamol (ROBAXIN) 500 MG tablet PLEASE SEE ATTACHED FOR DETAILED DIRECTIONS 09/11/21   [provider]  Methylnaltrexone Bromide (RELISTOR) 150 MG TABS Take 3 tablets (450 mg total) by mouth daily as needed (constipation). 02/16/23   Sallyanne Kuster, NP  morphine (MS CONTIN) 60 MG 12 hr tablet Take 60 mg by mouth every 12 (twelve) hours.    [provider]  Multiple Vitamin (MULTI-VITAMIN) tablet Take 1 tablet by mouth daily.    [provider]  nystatin (MYCOSTATIN/NYSTOP) powder Apply 1 application. topically 3 (three) times daily. 04/21/22   Sallyanne Kuster, NP  ondansetron (ZOFRAN-ODT) 4 MG disintegrating tablet Take 1 tablet (4 mg total) by mouth every 8 (eight) hours as needed for nausea or vomiting. 01/16/23   Sallyanne Kuster, NP  pantoprazole (PROTONIX) 40 MG tablet Take 1 tablet (40 mg total) by mouth daily. 12/25/22   Sallyanne Kuster, NP  pravastatin (PRAVACHOL) 10 MG tablet TAKE 1 TABLET BY MOUTH EVERY DAY 10/17/22   Sallyanne Kuster, NP  pregabalin (LYRICA) 150 MG capsule Take 150 mg by mouth 3  (three) times daily.     [provider]  promethazine (PHENERGAN) 25 MG tablet Take 1 tablet (25 mg total) by mouth every 8 (eight) hours as needed for nausea or vomiting. 12/25/22   Sallyanne Kuster, NP  rOPINIRole (REQUIP) 0.25 MG tablet Take 1-2 tabs at night for restless legs 09/08/22   Sallyanne Kuster, NP  Semaglutide, 2 MG/DOSE, 8 MG/3ML SOPN Inject 2 mg as directed once a week. 12/25/22   Sallyanne Kuster, NP  traZODone (DESYREL) 50 MG tablet Take 0.5-1 tablets (25-50 mg total) by mouth at bedtime as needed for sleep. 12/25/22   Sallyanne Kuster, NP  Vitamin D, Ergocalciferol, (DRISDOL) 1.25 MG (50000 UNIT) CAPS capsule Take 1 capsule (50,000 Units total) by mouth every 7 (  seven) days. 07/31/22   Sallyanne Kuster, NP    Family History  Problem Relation Age of Onset   Cancer Paternal Aunt    Cancer Paternal Uncle    Diabetes Paternal Grandfather    Heart disease Paternal Grandfather      Social History   Tobacco Use   Smoking status: Never   Smokeless tobacco: Never  Vaping Use   Vaping Use: Never used  Substance Use Topics   Alcohol use: No   Drug use: No    Allergies as of 04/18/2023 - Review Complete 04/18/2023  Allergen Reaction Noted   Oxycodone hcl Nausea And Vomiting 09/21/2014   Pollen extract Other (See Comments) 04/06/2015   Topiramate Other (See Comments) 01/17/2017   Bee venom Hives, Itching, and Swelling 09/26/2014   Buspirone  08/28/2022   Crestor [rosuvastatin] Other (See Comments) 09/26/2014   Doxycycline Nausea Only 09/26/2014   Mirtazapine  08/28/2022   Oxycontin [oxycodone] Nausea Only 09/26/2014   Wellbutrin [bupropion]  10/24/2021    Review of Systems:    All systems reviewed and negative except where noted in HPI.   Physical Exam:  BP 105/72   Pulse 91   Temp 98.4 F (36.9 C)   Ht 5\' 1"  (1.549 m)   Wt 154 lb (69.9 kg)   LMP 12/13/1991   BMI 29.10 kg/m  Patient's last menstrual period was 12/13/1991. Psych:  Alert and  cooperative. Normal mood and affect. General:   Alert,  Well-developed, well-nourished, pleasant and cooperative in NAD Head:  Normocephalic and atraumatic. Eyes:  Sclera clear, no icterus.   Conjunctiva pink. Lungs:  Respirations even and unlabored.  Clear throughout to auscultation.   No wheezes, crackles, or rhonchi. No acute distress. Heart:  Regular rate and rhythm; no murmurs, clicks, rubs, or gallops. Abdomen:  Normal bowel sounds.  No bruits.  Soft and obese without masses, hepatosplenomegaly.  There is moderate epigastric, RUQ, LLQ UQ, and lower abdominal tenderness bilaterally.  Rectus diastases present.   Neurologic:  Alert and oriented x3;  grossly normal neurologically. Psych:  Alert and cooperative. Normal mood and affect.  Imaging Studies: No results found.  Assessment and Plan:   [name] [name] is a 47 y.o. y/o female has been referred for Multiple GI symptoms.  She had a recent positive screening Cologuard test.  She has had episodes of upper and lower abdominal pain, constipation, nausea, and vomiting since 10/2022.  Has history of chronic constipation.  I am scheduling EGD and colonoscopy for further evaluation.  Also starting her on Linzess.  Positive Cologuard  Scheduling colonoscopy I have discussed risks & benefits of the procedure  which include, but are not limited to, bleeding, infection, perforation,respiratory compromise & drug reaction.  The patient agrees with this plan & written consent will be obtained.     2.   Abdominal pain  Treating underlying constipation  Schedule EGD to evaluate epigastric pain, nausea, and vomiting  3.   Nausea and vomiting  Schedule EGD to evaluate epigastric pain, nausea, and vomiting I discussed risks of EGD with patient to include risk of bleeding, perforation, and risk of sedation.   Patient expressed understanding and agrees to proceed with EGD.   4.   Chronic constipation  Start Linzess 72 mcg once daily for a week,  then 145 mcg once daily for a week.  Samples given.  Call back with update in a few weeks and let us know which dose she likes.  Then we can call in  prescription.  We can also increase to 290 if needed.  Recommend high-fiber diet, fruits, vegetables, and 64 ounces of water daily.  Follow up in 4 weeks after Procedures.  Celso Amy, PA-C

## 2023-04-23 ENCOUNTER — Telehealth: Payer: Self-pay | Admitting: *Deleted

## 2023-04-23 NOTE — Telephone Encounter (Signed)
Spoken to patient, she needs to reschedule colonoscopy due to transportation.  New date of 05/09/2023, called endo unit to make the change.  New instructions will be sent.

## 2023-04-30 DIAGNOSIS — Z79899 Other long term (current) drug therapy: Secondary | ICD-10-CM | POA: Diagnosis not present

## 2023-04-30 DIAGNOSIS — G8929 Other chronic pain: Secondary | ICD-10-CM | POA: Diagnosis not present

## 2023-04-30 DIAGNOSIS — M797 Fibromyalgia: Secondary | ICD-10-CM | POA: Diagnosis not present

## 2023-04-30 DIAGNOSIS — M545 Low back pain, unspecified: Secondary | ICD-10-CM | POA: Diagnosis not present

## 2023-04-30 DIAGNOSIS — M5431 Sciatica, right side: Secondary | ICD-10-CM | POA: Diagnosis not present

## 2023-04-30 DIAGNOSIS — M62838 Other muscle spasm: Secondary | ICD-10-CM | POA: Diagnosis not present

## 2023-04-30 DIAGNOSIS — M25562 Pain in left knee: Secondary | ICD-10-CM | POA: Diagnosis not present

## 2023-05-01 ENCOUNTER — Other Ambulatory Visit: Payer: Self-pay | Admitting: Nurse Practitioner

## 2023-05-01 DIAGNOSIS — Z79899 Other long term (current) drug therapy: Secondary | ICD-10-CM

## 2023-05-02 DIAGNOSIS — Z79899 Other long term (current) drug therapy: Secondary | ICD-10-CM | POA: Diagnosis not present

## 2023-05-04 ENCOUNTER — Encounter: Payer: Self-pay | Admitting: Physician Assistant

## 2023-05-04 DIAGNOSIS — K5904 Chronic idiopathic constipation: Secondary | ICD-10-CM

## 2023-05-04 MED ORDER — LINACLOTIDE 145 MCG PO CAPS
290.0000 ug | ORAL_CAPSULE | Freq: Every day | ORAL | Status: AC
Start: 2023-05-05 — End: 2023-06-04

## 2023-05-06 ENCOUNTER — Other Ambulatory Visit: Payer: Self-pay | Admitting: Nurse Practitioner

## 2023-05-06 ENCOUNTER — Encounter: Payer: Self-pay | Admitting: Nurse Practitioner

## 2023-05-06 DIAGNOSIS — F16283 Hallucinogen dependence with hallucinogen persisting perception disorder (flashbacks): Secondary | ICD-10-CM

## 2023-05-06 DIAGNOSIS — Z79899 Other long term (current) drug therapy: Secondary | ICD-10-CM

## 2023-05-08 ENCOUNTER — Other Ambulatory Visit: Payer: Self-pay

## 2023-05-08 ENCOUNTER — Telehealth: Payer: Self-pay

## 2023-05-08 MED ORDER — TRAZODONE HCL 50 MG PO TABS
25.0000 mg | ORAL_TABLET | Freq: Every evening | ORAL | 3 refills | Status: DC | PRN
Start: 2023-05-08 — End: 2023-06-05

## 2023-05-08 MED ORDER — LINACLOTIDE 290 MCG PO CAPS: 290.0000 ug | ORAL_CAPSULE | Freq: Every day | ORAL | 2 refills | Status: DC

## 2023-05-08 MED ORDER — CARIPRAZINE HCL 3 MG PO CAPS
3.0000 mg | ORAL_CAPSULE | Freq: Every day | ORAL | 5 refills | Status: DC
Start: 2023-05-08 — End: 2023-09-23

## 2023-05-08 NOTE — Telephone Encounter (Signed)
Left detailed message on VM and RX sent to pharmacy.   Linzess to 1 capsule once daily. Send Rx for #30, 2RF to pharmacy.  Celso Amy, PA-C

## 2023-05-09 ENCOUNTER — Encounter
Admission: RE | Disposition: A | Discharge status: Home / Self Care | Payer: Self-pay | Source: Home / Self Care | Attending: Gastroenterology

## 2023-05-09 ENCOUNTER — Encounter: Payer: Self-pay | Admitting: Gastroenterology

## 2023-05-09 ENCOUNTER — Ambulatory Visit: Payer: Medicare Other | Admitting: Anesthesiology

## 2023-05-09 ENCOUNTER — Ambulatory Visit
Admission: RE | Admit: 2023-05-09 | Discharge: 2023-05-09 | Disposition: A | Discharge status: Home / Self Care | Payer: Medicare Other | Attending: Gastroenterology | Admitting: Gastroenterology

## 2023-05-09 DIAGNOSIS — Z7984 Long term (current) use of oral hypoglycemic drugs: Secondary | ICD-10-CM | POA: Insufficient documentation

## 2023-05-09 DIAGNOSIS — R195 Other fecal abnormalities: Secondary | ICD-10-CM | POA: Diagnosis not present

## 2023-05-09 DIAGNOSIS — Z7985 Long-term (current) use of injectable non-insulin antidiabetic drugs: Secondary | ICD-10-CM | POA: Insufficient documentation

## 2023-05-09 DIAGNOSIS — G709 Myoneural disorder, unspecified: Secondary | ICD-10-CM | POA: Insufficient documentation

## 2023-05-09 DIAGNOSIS — R112 Nausea with vomiting, unspecified: Secondary | ICD-10-CM | POA: Diagnosis not present

## 2023-05-09 DIAGNOSIS — I10 Essential (primary) hypertension: Secondary | ICD-10-CM | POA: Diagnosis not present

## 2023-05-09 DIAGNOSIS — F419 Anxiety disorder, unspecified: Secondary | ICD-10-CM | POA: Diagnosis not present

## 2023-05-09 DIAGNOSIS — E119 Type 2 diabetes mellitus without complications: Secondary | ICD-10-CM | POA: Diagnosis not present

## 2023-05-09 DIAGNOSIS — Z8249 Family history of ischemic heart disease and other diseases of the circulatory system: Secondary | ICD-10-CM | POA: Diagnosis not present

## 2023-05-09 DIAGNOSIS — Z1211 Encounter for screening for malignant neoplasm of colon: Secondary | ICD-10-CM | POA: Insufficient documentation

## 2023-05-09 DIAGNOSIS — G4730 Sleep apnea, unspecified: Secondary | ICD-10-CM | POA: Diagnosis not present

## 2023-05-09 DIAGNOSIS — K5909 Other constipation: Secondary | ICD-10-CM

## 2023-05-09 DIAGNOSIS — Z794 Long term (current) use of insulin: Secondary | ICD-10-CM | POA: Diagnosis not present

## 2023-05-09 DIAGNOSIS — Z833 Family history of diabetes mellitus: Secondary | ICD-10-CM | POA: Diagnosis not present

## 2023-05-09 DIAGNOSIS — K5641 Fecal impaction: Secondary | ICD-10-CM | POA: Diagnosis not present

## 2023-05-09 DIAGNOSIS — Z538 Procedure and treatment not carried out for other reasons: Secondary | ICD-10-CM | POA: Diagnosis not present

## 2023-05-09 DIAGNOSIS — R1084 Generalized abdominal pain: Secondary | ICD-10-CM

## 2023-05-09 DIAGNOSIS — J4489 Other specified chronic obstructive pulmonary disease: Secondary | ICD-10-CM | POA: Insufficient documentation

## 2023-05-09 HISTORY — PX: COLONOSCOPY WITH PROPOFOL: SHX5780

## 2023-05-09 HISTORY — DX: Sleep apnea, unspecified: G47.30

## 2023-05-09 HISTORY — PX: ESOPHAGOGASTRODUODENOSCOPY (EGD) WITH PROPOFOL: SHX5813

## 2023-05-09 LAB — GLUCOSE, CAPILLARY: Glucose-Capillary: 108 mg/dL — ABNORMAL HIGH (ref 70–99)

## 2023-05-09 SURGERY — COLONOSCOPY WITH PROPOFOL
Anesthesia: General

## 2023-05-09 MED ORDER — LIDOCAINE HCL (CARDIAC) PF 100 MG/5ML IV SOSY
PREFILLED_SYRINGE | INTRAVENOUS | Status: DC | PRN
  Administered 2023-05-09: 100 mg via INTRAVENOUS

## 2023-05-09 MED ORDER — PROPOFOL 10 MG/ML IV BOLUS
INTRAVENOUS | Status: DC | PRN
  Administered 2023-05-09: 50 mg via INTRAVENOUS
  Administered 2023-05-09: 150 ug/kg/min via INTRAVENOUS

## 2023-05-09 MED ORDER — SODIUM CHLORIDE 0.9 % IV SOLN: INTRAVENOUS | Status: DC

## 2023-05-09 NOTE — H&P (Signed)
Wyline Mood, MD [address], [place], [place], [address] [address], [place], [place], [address] Phone: [phone]  Fax: [phone]  Primary Care Physician:  Sallyanne Kuster, NP   Pre-Procedure History & Physical: HPI:  [name] [name] is a 47 y.o. female is here for an endoscopy and colonoscopy    Past Medical History:  Diagnosis Date   Actinic keratitis    Allergy    Anxiety    Asthma    Basal cell carcinoma    Chronic venous insufficiency    DDD (degenerative disc disease), lumbar    Diabetes mellitus without complication (HCC)    Fibromyalgia    Fibromyalgia    Hyperlipidemia    Lymphedema    Melanoma (HCC)    Migraine    Mitral valve prolapse    Sleep apnea    Tendonitis of foot    left    Past Surgical History:  Procedure Laterality Date   ABDOMINAL HYSTERECTOMY     CARPAL TUNNEL RELEASE     FOOT TENDON SURGERY Right    KNEE SURGERY     SPINAL FUSION     SPINAL FUSION      Prior to Admission medications   Medication Sig Start Date End Date Taking? Authorizing Provider  cariprazine (VRAYLAR) 3 MG capsule Take 1 capsule (3 mg total) by mouth daily. 05/08/23  Yes Abernathy, Arlyss Repress, NP  Cholecalciferol 25 MCG (1000 UT) tablet Take 1 tablet (1,000 Units total) by mouth once a week. 01/17/21  Yes McDonough, Lauren K, PA-C  FLUoxetine (PROZAC) 40 MG capsule Take 1 capsule (40 mg total) by mouth daily. 12/25/22  Yes Abernathy, Arlyss Repress, NP  fluticasone (FLONASE) 50 MCG/ACT nasal spray Place 2 sprays into both nostrils daily. 10/30/22  Yes Margaretann Loveless, PA-C  HYDROmorphone (DILAUDID) 4 MG tablet Take 4 mg by mouth 4 (four) times daily as needed. 07/10/22  Yes [provider]  insulin glargine, 2 Unit Dial, (TOUJEO MAX SOLOSTAR) 300 UNIT/ML Solostar Pen Inject 35 Units into the skin daily. 01/30/22  Yes Abernathy, Arlyss Repress, NP  levothyroxine (SYNTHROID) 50 MCG tablet Take 1 tablet (50 mcg total) by mouth daily. 03/23/23   Yes Abernathy, Alyssa, NP  morphine (MS CONTIN) 60 MG 12 hr tablet Take 60 mg by mouth every 12 (twelve) hours.   Yes [provider]  Multiple Vitamin (MULTI-VITAMIN) tablet Take 1 tablet by mouth daily.   Yes [provider]  pravastatin (PRAVACHOL) 10 MG tablet TAKE 1 TABLET BY MOUTH EVERY DAY 10/17/22  Yes Abernathy, Alyssa, NP  pregabalin (LYRICA) 150 MG capsule Take 150 mg by mouth 3 (three) times daily.    Yes [provider]  Vitamin D, Ergocalciferol, (DRISDOL) 1.25 MG (50000 UNIT) CAPS capsule Take 1 capsule (50,000 Units total) by mouth every 7 (seven) days. 07/31/22  Yes Abernathy, Arlyss Repress, NP  albuterol (VENTOLIN HFA) 108 (90 Base) MCG/ACT inhaler Inhale 1-2 puffs into the lungs every 6 (six) hours as needed for wheezing or shortness of breath. 10/30/22   Margaretann Loveless, PA-C  ALPRAZolam Prudy Feeler) 0.5 MG tablet Take 1 tablet (0.5 mg total) by mouth at bedtime as needed for anxiety. 03/23/23   Sallyanne Kuster, NP  ammonium lactate (LAC-HYDRIN) 12 % lotion Apply 1 application topically as needed for dry skin. 07/06/20   Johnna Acosta, NP  benzonatate (TESSALON) 100 MG capsule Take 1 capsule (100 mg total) by mouth 3 (three) times daily as needed. 10/30/22  Burnette, Jennifer M, PA-C  COMBIVENT RESPIMAT 20-100 MCG/ACT AERS respimat INHALE 1 PUFF INTO THE LUNGS EVERY 6 HOURS AS NEEDED FOR WHEEZING 03/27/23   Sallyanne Kuster, NP  diclofenac Sodium (VOLTAREN) 1 % GEL SMARTSIG:2-4 Gram(s) Topical 1-4 Times Daily PRN 08/29/21   [provider]  FARXIGA 10 MG TABS tablet TAKE 1 TABLET(10 MG) BY MOUTH DAILY 03/19/23   Sallyanne Kuster, NP  glucose blood (ONETOUCH VERIO) test strip Use 1 test strip to check glucose level three times daily and prn 09/08/22   Sallyanne Kuster, NP  ibuprofen (ADVIL) 800 MG tablet TAKE 1 TABLET(800 MG) BY MOUTH EVERY 6 HOURS AS NEEDED 03/01/23   Sallyanne Kuster, NP  Insulin Pen Needle 31G X 5 MM MISC Use as directed  Once a day  Dx e11.65 01/30/22   Sallyanne Kuster, NP  Lancets (ONETOUCH DELICA PLUS LANCET33G) MISC Use 1 lancet to check glucose three times daily and prn 09/08/22   Sallyanne Kuster, NP  linaclotide (LINZESS) 290 MCG CAPS capsule Take 1 capsule (290 mcg total) by mouth daily before breakfast. 05/08/23   Celso Amy, PA-C  meclizine (ANTIVERT) 25 MG tablet Take 1 tablet (25 mg total) by mouth 3 (three) times daily as needed for dizziness. 02/26/23   Sallyanne Kuster, NP  methocarbamol (ROBAXIN) 500 MG tablet PLEASE SEE ATTACHED FOR DETAILED DIRECTIONS 09/11/21   [provider]  Methylnaltrexone Bromide (RELISTOR) 150 MG TABS Take 3 tablets (450 mg total) by mouth daily as needed (constipation). 02/16/23   Sallyanne Kuster, NP  nystatin (MYCOSTATIN/NYSTOP) powder Apply 1 application. topically 3 (three) times daily. 04/21/22   Sallyanne Kuster, NP  ondansetron (ZOFRAN-ODT) 4 MG disintegrating tablet Take 1 tablet (4 mg total) by mouth every 8 (eight) hours as needed for nausea or vomiting. 01/16/23   Sallyanne Kuster, NP  OZEMPIC, 2 MG/DOSE, 8 MG/3ML SOPN INJECT 2MG  UNDER SKIN WEEKLY 05/01/23   Sallyanne Kuster, NP  pantoprazole (PROTONIX) 40 MG tablet Take 1 tablet (40 mg total) by mouth daily. 12/25/22   Sallyanne Kuster, NP  promethazine (PHENERGAN) 25 MG tablet Take 1 tablet (25 mg total) by mouth every 8 (eight) hours as needed for nausea or vomiting. 12/25/22   Sallyanne Kuster, NP  rOPINIRole (REQUIP) 0.25 MG tablet Take 1-2 tabs at night for restless legs 09/08/22   Sallyanne Kuster, NP  traZODone (DESYREL) 50 MG tablet Take 0.5-1 tablets (25-50 mg total) by mouth at bedtime as needed for sleep. 05/08/23   Sallyanne Kuster, NP    Allergies as of 04/19/2023 - Review Complete 04/18/2023  Allergen Reaction Noted   Oxycodone hcl Nausea And Vomiting 09/21/2014   Pollen extract Other (See Comments) 04/06/2015   Topiramate Other (See Comments) 01/17/2017   Bee venom Hives, Itching, and Swelling  09/26/2014   Buspirone  08/28/2022   Crestor [rosuvastatin] Other (See Comments) 09/26/2014   Doxycycline Nausea Only 09/26/2014   Mirtazapine  08/28/2022   Oxycontin [oxycodone] Nausea Only 09/26/2014   Wellbutrin [bupropion]  10/24/2021    Family History  Problem Relation Age of Onset   Cancer Paternal Aunt    Cancer Paternal Uncle    Diabetes Paternal Grandfather    Heart disease Paternal Grandfather     Social History   Socioeconomic History   Marital status: Divorced    Spouse name: Not on file   Number of children: Not on file   Years of education: Not on file   Highest education level: Not on file  Occupational History   Not on  file  Tobacco Use   Smoking status: Never   Smokeless tobacco: Never  Vaping Use   Vaping Use: Never used  Substance and Sexual Activity   Alcohol use: No   Drug use: No   Sexual activity: Not on file  Other Topics Concern   Not on file  Social History Narrative   Not on file   Social Determinants of Health   Financial Resource Strain: Not on file  Food Insecurity: Not on file  Transportation Needs: Not on file  Physical Activity: Not on file  Stress: Not on file  Social Connections: Not on file  Intimate Partner Violence: Not on file    Review of Systems: See HPI, otherwise negative ROS  Physical Exam: BP 117/71   Pulse 83   Temp (!) 96.7 F (35.9 C) (Temporal)   Resp 16   Wt 69.5 kg   LMP 12/13/1991   SpO2 100%   BMI 28.95 kg/m  General:   Alert,  pleasant and cooperative in NAD Head:  Normocephalic and atraumatic. Neck:  Supple; no masses or thyromegaly. Lungs:  Clear throughout to auscultation, normal respiratory effort.    Heart:  +S1, +S2, Regular rate and rhythm, No edema. Abdomen:  Soft, nontender and nondistended. Normal bowel sounds, without guarding, and without rebound.   Neurologic:  Alert and  oriented x4;  grossly normal neurologically.  Impression/Plan: [name] [name] is here for an endoscopy  and colonoscopy  to be performed for  evaluation of nausea, vomiting , positive cologuard    Risks, benefits, limitations, and alternatives regarding endoscopy have been reviewed with the patient.  Questions have been answered.  All parties agreeable.   Wyline Mood, MD  05/09/2023, 9:15 AM

## 2023-05-09 NOTE — Anesthesia Postprocedure Evaluation (Signed)
Anesthesia Post Note  Patient: ADALINN CROCHET  Procedure(s) Performed: COLONOSCOPY WITH PROPOFOL ESOPHAGOGASTRODUODENOSCOPY (EGD) WITH PROPOFOL  Patient location during evaluation: PACU Anesthesia Type: General Level of consciousness: awake and alert Pain management: pain level controlled Vital Signs Assessment: post-procedure vital signs reviewed and stable Respiratory status: spontaneous breathing, nonlabored ventilation, respiratory function stable and patient connected to nasal cannula oxygen Cardiovascular status: blood pressure returned to baseline and stable Postop Assessment: no apparent nausea or vomiting Anesthetic complications: no   There were no known notable events for this encounter.   Last Vitals:  Vitals:   05/09/23 0847 05/09/23 0938  BP: 117/71   Pulse: 83   Resp: 16   Temp: (!) 35.9 C (!) 35.7 C  SpO2: 100%     Last Pain:  Vitals:   05/09/23 0948  TempSrc:   PainSc: 0-No pain                 Yevette Edwards

## 2023-05-09 NOTE — Op Note (Signed)
[facility] Gastroenterology Patient Name: [name] [name] Procedure Date: 05/09/2023 9:09 AM MRN: [id number] Account #: [id number] Date of Birth: 1976-02-18 Admit Type: Outpatient Age: 47 Room: [facility] ENDO ROOM 3 Gender: Female Note Status: Finalized Instrument Name: Upper Endoscope [id number] Procedure:             Upper GI endoscopy Indications:           Nausea with vomiting Providers:             Wyline Mood MD, MD Medicines:             Monitored Anesthesia Care Complications:         No immediate complications. Procedure:             Pre-Anesthesia Assessment:                        - Prior to the procedure, a History and Physical was                         performed, and patient medications, allergies and                         sensitivities were reviewed. The patient's tolerance                         of previous anesthesia was reviewed.                        - The risks and benefits of the procedure and the                         sedation options and risks were discussed with the                         patient. All questions were answered and informed                         consent was obtained.                        - ASA Grade Assessment: II - A patient with mild                         systemic disease.                        After obtaining informed consent, the endoscope was                         passed under direct vision. Throughout the procedure,                         the patient's blood pressure, pulse, and oxygen                         saturations were monitored continuously. The Endoscope                         was introduced through the mouth, and advanced to the  third part of duodenum. The upper GI endoscopy was                         accomplished with ease. The patient tolerated the                         procedure well. Findings:      The esophagus was normal.      The examined duodenum was normal.       Bilious fluid was found on the greater curvature of the stomach.      The cardia and gastric fundus were normal on retroflexion. Impression:            - Normal esophagus.                        - Normal examined duodenum.                        - Bilious gastric fluid.                        - No specimens collected. Recommendation:        - Perform a colonoscopy today. Procedure Code(s):     --- Professional ---                        [phone], Esophagogastroduodenoscopy, flexible,                         transoral; diagnostic, including collection of                         specimen(s) by brushing or washing, when performed                         (separate procedure) Diagnosis Code(s):     --- Professional ---                        R11.2, Nausea with vomiting, unspecified CPT copyright 2022 [employer] Association. All rights reserved. The codes documented in this report are preliminary and upon coder review may  be revised to meet current compliance requirements. Wyline Mood, MD Wyline Mood MD, MD 05/09/2023 9:31:31 AM This report has been signed electronically. Number of Addenda: 0 Note Initiated On: 05/09/2023 9:09 AM Estimated Blood Loss:  Estimated blood loss: none.      [facility]

## 2023-05-09 NOTE — Op Note (Signed)
[facility] Gastroenterology Patient Name: [name] [name] Procedure Date: 05/09/2023 9:09 AM MRN: [id number] Account #: [id number] Date of Birth: 05/13/1976 Admit Type: Outpatient Age: 47 Room: [facility] ENDO ROOM 3 Gender: Female Note Status: Finalized Instrument Name: Nelda Marseille [id number] Procedure:             Colonoscopy Indications:           Screening for colorectal malignant neoplasm due to                         positive Cologuard test Providers:             Wyline Mood MD, MD Medicines:             Monitored Anesthesia Care Complications:         No immediate complications. Procedure:             Pre-Anesthesia Assessment:                        - Prior to the procedure, a History and Physical was                         performed, and patient medications, allergies and                         sensitivities were reviewed. The patient's tolerance                         of previous anesthesia was reviewed.                        - The risks and benefits of the procedure and the                         sedation options and risks were discussed with the                         patient. All questions were answered and informed                         consent was obtained.                        - ASA Grade Assessment: II - A patient with mild                         systemic disease.                        After obtaining informed consent, the colonoscope was                         passed under direct vision. Throughout the procedure,                         the patient's blood pressure, pulse, and oxygen                         saturations were monitored continuously. The  Colonoscope was introduced through the anus with the                         intention of advancing to the cecum. The scope was                         advanced to the sigmoid colon before the procedure was                         aborted. Medications were given. The  colonoscopy was                         performed with ease. The patient tolerated the                         procedure well. The quality of the bowel preparation                         was unsatisfactory. Findings:      The perianal and digital rectal examinations were normal.      A large amount of semi-solid stool was found in the rectum and in the       sigmoid colon, interfering with visualization. Impression:            - Preparation of the colon was unsatisfactory.                        - Stool in the rectum and in the sigmoid colon.                        - No specimens collected. Recommendation:        - Discharge patient to home (with escort).                        - Resume previous diet.                        - Continue present medications.                        - Repeat colonoscopy in 2 weeks because the bowel                         preparation was suboptimal. Procedure Code(s):     --- Professional ---                        [phone], 53, Colonoscopy, flexible; diagnostic,                         including collection of specimen(s) by brushing or                         washing, when performed (separate procedure) Diagnosis Code(s):     --- Professional ---                        Z12.11, Encounter for screening for malignant neoplasm  of colon                        R19.5, Other fecal abnormalities CPT copyright 2022 [employer]. All rights reserved. The codes documented in this report are preliminary and upon coder review may  be revised to meet current compliance requirements. Wyline Mood, MD Wyline Mood MD, MD 05/09/2023 9:35:11 AM This report has been signed electronically. Number of Addenda: 0 Note Initiated On: 05/09/2023 9:09 AM Total Procedure Duration: 0 hours 0 minutes 38 seconds  Estimated Blood Loss:  Estimated blood loss: none.      [facility]

## 2023-05-09 NOTE — Anesthesia Preprocedure Evaluation (Addendum)
Anesthesia Evaluation  Patient identified by MRN, date of birth, ID band Patient awake    Reviewed: Allergy & Precautions, H&P , NPO status , Patient's Chart, lab work & pertinent test results, reviewed documented beta blocker date and time   Airway Mallampati: II   Neck ROM: full    Dental  (+) Poor Dentition   Pulmonary neg pulmonary ROS, asthma , sleep apnea and Continuous Positive Airway Pressure Ventilation , COPD   Pulmonary exam normal        Cardiovascular Exercise Tolerance: Good hypertension, negative cardio ROS Normal cardiovascular exam Rhythm:regular Rate:Normal     Neuro/Psych  Headaches PSYCHIATRIC DISORDERS Anxiety      Neuromuscular disease negative neurological ROS  negative psych ROS   GI/Hepatic negative GI ROS, Neg liver ROS,,,  Endo/Other  negative endocrine ROSdiabetes, Well Controlled    Renal/GU negative Renal ROS  negative genitourinary   Musculoskeletal   Abdominal   Peds  Hematology negative hematology ROS (+)   Anesthesia Other Findings Past Medical History: No date: Actinic keratitis No date: Allergy No date: Anxiety No date: Asthma No date: Basal cell carcinoma No date: Chronic venous insufficiency No date: DDD (degenerative disc disease), lumbar No date: Diabetes mellitus without complication (HCC) No date: Fibromyalgia No date: Fibromyalgia No date: Hyperlipidemia No date: Lymphedema No date: Melanoma (HCC) No date: Migraine No date: Mitral valve prolapse No date: Sleep apnea No date: Tendonitis of foot     Comment:  left Past Surgical History: No date: ABDOMINAL HYSTERECTOMY No date: CARPAL TUNNEL RELEASE No date: FOOT TENDON SURGERY; Right No date: KNEE SURGERY No date: SPINAL FUSION No date: SPINAL FUSION BMI    Body Mass Index: 28.95 kg/m     Reproductive/Obstetrics negative OB ROS                             Anesthesia  Physical Anesthesia Plan  ASA: 3  Anesthesia Plan: General   Post-op Pain Management:    Induction:   PONV Risk Score and Plan:   Airway Management Planned:   Additional Equipment:   Intra-op Plan:   Post-operative Plan:   Informed Consent: I have reviewed the patients History and Physical, chart, labs and discussed the procedure including the risks, benefits and alternatives for the proposed anesthesia with the patient or authorized representative who has indicated his/her understanding and acceptance.     Dental Advisory Given  Plan Discussed with: CRNA  Anesthesia Plan Comments:        Anesthesia Quick Evaluation

## 2023-05-09 NOTE — Transfer of Care (Signed)
Immediate Anesthesia Transfer of Care Note  Patient: [name] [name]  Procedure(s) Performed: COLONOSCOPY WITH PROPOFOL ESOPHAGOGASTRODUODENOSCOPY (EGD) WITH PROPOFOL  Patient Location: PACU  Anesthesia Type:General  Level of Consciousness: awake, alert , and oriented  Airway & Oxygen Therapy: Patient Spontanous Breathing  Post-op Assessment: Report given to RN and Post -op Vital signs reviewed and stable  Post vital signs: stable  Last Vitals:  Vitals Value Taken Time  BP 103/74 05/09/23 0937  Temp 35.7 C 05/09/23 0938  Pulse 90 05/09/23 0938  Resp 13 05/09/23 0938  SpO2 93 % 05/09/23 0938  Vitals shown include unvalidated device data.  Last Pain:  Vitals:   05/09/23 0938  TempSrc: Temporal  PainSc: Asleep         Complications: No notable events documented.

## 2023-05-10 ENCOUNTER — Encounter: Payer: Self-pay | Admitting: Gastroenterology

## 2023-05-11 ENCOUNTER — Other Ambulatory Visit: Payer: Self-pay | Admitting: Nurse Practitioner

## 2023-05-11 DIAGNOSIS — T402X5A Adverse effect of other opioids, initial encounter: Secondary | ICD-10-CM

## 2023-05-11 NOTE — Telephone Encounter (Signed)
Please review and send 

## 2023-05-15 ENCOUNTER — Encounter: Payer: Self-pay | Admitting: Pharmacist

## 2023-05-15 NOTE — Progress Notes (Signed)
[facility]) [facility] Quality Pharmacy Team Statin Quality Measure Assessment  05/15/2023  [name] [name] 02/02/1976 [id number]  Per review of chart and payor information, [name] has a diagnosis of diabetes but is not currently filling a statin prescription.  This places patient into the Statin Use In Patients with Diabetes (SUPD) measure for CMS.    Patient has documented trials of rosuvastatin  with reported muscle pain/spasms, but no corresponding CPT codes that would exclude patient from SUPD measure.  Pravastatin is listed as an active medication but she has not filled since Nov 2023. If clinically appropriate, please consider evaluating her statin intolerance or encourage compliance with pravastatin at tomorrow's office visit.  Please consider ONE of the following recommendations:  Initiate high intensity statin Atorvastatin 40 mg once daily, #90, 3 refills   Rosuvastatin 20 mg once daily, #90, 3 refills    Initiate moderate intensity          statin with reduced frequency if prior          statin intolerance 1x weekly, #13, 3 refills   2x weekly, #26, 3 refills   3x weekly, #39, 3 refills    Code for past statin intolerance or  other exclusions (required annually)  Provider Requirements: Associate code during an office visit or telehealth encounter  Drug Induced Myopathy G72.0   Myopathy, unspecified G72.9   Myositis, unspecified M60.9   Rhabdomyolysis M62.82   Cirrhosis of liver K74.69   Prediabetes R73.03   PCOS E28.2   Thank you for allowing [facility] pharmacy to be a part of this patient's care.  Dellie Burns, PharmD [facility]  [facility] Clinical Pharmacist Office: [phone]

## 2023-05-16 ENCOUNTER — Ambulatory Visit: Payer: Medicare Other | Admitting: Nurse Practitioner

## 2023-05-29 ENCOUNTER — Ambulatory Visit: Payer: Medicare Other | Admitting: Nurse Practitioner

## 2023-05-29 DIAGNOSIS — M62838 Other muscle spasm: Secondary | ICD-10-CM | POA: Diagnosis not present

## 2023-05-29 DIAGNOSIS — M797 Fibromyalgia: Secondary | ICD-10-CM | POA: Diagnosis not present

## 2023-05-29 DIAGNOSIS — M25562 Pain in left knee: Secondary | ICD-10-CM | POA: Diagnosis not present

## 2023-05-29 DIAGNOSIS — M545 Low back pain, unspecified: Secondary | ICD-10-CM | POA: Diagnosis not present

## 2023-05-29 DIAGNOSIS — G8929 Other chronic pain: Secondary | ICD-10-CM | POA: Diagnosis not present

## 2023-05-29 DIAGNOSIS — M5431 Sciatica, right side: Secondary | ICD-10-CM | POA: Diagnosis not present

## 2023-06-04 ENCOUNTER — Other Ambulatory Visit: Payer: Self-pay | Admitting: Nurse Practitioner

## 2023-06-04 DIAGNOSIS — Z760 Encounter for issue of repeat prescription: Secondary | ICD-10-CM

## 2023-06-05 ENCOUNTER — Encounter: Payer: Self-pay | Admitting: Nurse Practitioner

## 2023-06-05 ENCOUNTER — Ambulatory Visit (INDEPENDENT_AMBULATORY_CARE_PROVIDER_SITE_OTHER): Payer: Medicare Other | Admitting: Nurse Practitioner

## 2023-06-05 VITALS — BP 110/68 | HR 95 | Temp 98.3°F | Resp 16 | Ht 61.0 in | Wt 155.4 lb

## 2023-06-05 DIAGNOSIS — E1165 Type 2 diabetes mellitus with hyperglycemia: Secondary | ICD-10-CM

## 2023-06-05 DIAGNOSIS — E782 Mixed hyperlipidemia: Secondary | ICD-10-CM | POA: Diagnosis not present

## 2023-06-05 DIAGNOSIS — K5903 Drug induced constipation: Secondary | ICD-10-CM

## 2023-06-05 DIAGNOSIS — Z79899 Other long term (current) drug therapy: Secondary | ICD-10-CM | POA: Diagnosis not present

## 2023-06-05 DIAGNOSIS — F411 Generalized anxiety disorder: Secondary | ICD-10-CM

## 2023-06-05 DIAGNOSIS — T402X5A Adverse effect of other opioids, initial encounter: Secondary | ICD-10-CM

## 2023-06-05 MED ORDER — ATORVASTATIN CALCIUM 10 MG PO TABS
10.0000 mg | ORAL_TABLET | Freq: Every day | ORAL | 5 refills | Status: DC
Start: 2023-06-05 — End: 2023-11-28

## 2023-06-05 MED ORDER — PROMETHAZINE HCL 25 MG PO TABS
25.0000 mg | ORAL_TABLET | Freq: Three times a day (TID) | ORAL | 0 refills | Status: DC | PRN
Start: 2023-06-05 — End: 2023-11-28

## 2023-06-05 MED ORDER — ACCU-CHEK GUIDE VI STRP
ORAL_STRIP | 12 refills | Status: AC
Start: 2023-06-05 — End: ?

## 2023-06-05 MED ORDER — COMBIVENT RESPIMAT 20-100 MCG/ACT IN AERS: INHALATION_SPRAY | RESPIRATORY_TRACT | 1 refills | Status: DC

## 2023-06-05 MED ORDER — ALBUTEROL SULFATE HFA 108 (90 BASE) MCG/ACT IN AERS
1.0000 | INHALATION_SPRAY | Freq: Four times a day (QID) | RESPIRATORY_TRACT | 1 refills | Status: DC | PRN
Start: 2023-06-05 — End: 2023-11-28

## 2023-06-05 MED ORDER — ALPRAZOLAM 0.5 MG PO TABS
0.5000 mg | ORAL_TABLET | Freq: Every evening | ORAL | 2 refills | Status: DC | PRN
Start: 2023-06-05 — End: 2023-08-07

## 2023-06-05 MED ORDER — IBUPROFEN 800 MG PO TABS
800.0000 mg | ORAL_TABLET | Freq: Three times a day (TID) | ORAL | 1 refills | Status: DC | PRN
Start: 2023-06-05 — End: 2023-11-28

## 2023-06-05 MED ORDER — NYSTATIN 100000 UNIT/GM EX POWD
1.0000 | Freq: Three times a day (TID) | CUTANEOUS | 5 refills | Status: AC
Start: 2023-06-05 — End: ?

## 2023-06-05 MED ORDER — NALOXEGOL OXALATE 25 MG PO TABS
25.0000 mg | ORAL_TABLET | Freq: Every day | ORAL | 2 refills | Status: DC
Start: 2023-06-05 — End: 2023-11-28

## 2023-06-05 MED ORDER — OZEMPIC (2 MG/DOSE) 8 MG/3ML ~~LOC~~ SOPN
PEN_INJECTOR | SUBCUTANEOUS | 2 refills | Status: DC
Start: 2023-06-05 — End: 2023-11-28

## 2023-06-05 MED ORDER — TRAZODONE HCL 50 MG PO TABS
25.0000 mg | ORAL_TABLET | Freq: Every evening | ORAL | 3 refills | Status: DC | PRN
Start: 2023-06-05 — End: 2023-09-23

## 2023-06-05 MED ORDER — ROPINIROLE HCL 0.25 MG PO TABS
ORAL_TABLET | ORAL | 3 refills | Status: DC
Start: 2023-06-05 — End: 2024-10-21

## 2023-06-05 MED ORDER — PANTOPRAZOLE SODIUM 40 MG PO TBEC: 40.0000 mg | DELAYED_RELEASE_TABLET | Freq: Every day | ORAL | 1 refills | Status: DC

## 2023-06-05 MED ORDER — ACCU-CHEK SOFTCLIX LANCETS MISC
12 refills | Status: AC
Start: 2023-06-05 — End: ?

## 2023-06-05 MED ORDER — ONDANSETRON 4 MG PO TBDP
4.0000 mg | ORAL_TABLET | Freq: Three times a day (TID) | ORAL | 0 refills | Status: DC | PRN
Start: 2023-06-05 — End: 2023-11-28

## 2023-06-05 MED ORDER — ACCU-CHEK GUIDE ME W/DEVICE KIT
PACK | 0 refills | Status: AC
Start: 2023-06-05 — End: ?

## 2023-06-05 NOTE — Progress Notes (Signed)
[facility] [address] [place], [place] [address]  Internal MEDICINE  Office Visit Note  Patient Name: [name] [name]  [id number]  [id number]  Date of Service: 06/05/2023  Chief Complaint  Patient presents with   Follow-up   Diabetes   Hyperlipidemia    HPI [name] [name] presents for a follow-up visit for constipation, diabetes, and high cholesterol.  Opioid induced Constipation -- this is a chronic problem. She had a positive cologuard test. She went for a follow up colonoscopy but they were unable to pass the scope through due to a large amount of hard stool. She reports that they were not even able to disimpact her. She has tried using linzess and relistor, along with OTC medications for constipation. Nothing has been helping at all.  Diabetes -- glucose meter is broken so she has not been checking her glucose level. She also stopped taking her basal insulin several months ago. She has continued to take the farxiga and ozempic as prescribed.  High cholesterol -- has been on pravastatin for years. Her most recent cholesterol panel is significantly elevated. Was previously taking rosuvastatin which she did not tolerate. Anxiety -- takes prn alprazolam for anxiety. She is also still taking vraylar which she states is helping.  Insomnia -- taking trazodone as needed or alprazolam    Current Medication: Outpatient Encounter Medications as of 06/05/2023  Medication Sig   Accu-Chek Softclix Lancets lancets Use 1 lancet to check glucose level once daily   ammonium lactate (LAC-HYDRIN) 12 % lotion Apply 1 application topically as needed for dry skin.   atorvastatin (LIPITOR) 10 MG tablet Take 1 tablet (10 mg total) by mouth daily.   Blood Glucose Monitoring Suppl (ACCU-CHEK GUIDE ME) w/Device KIT Use as directed. E11.65   cariprazine (VRAYLAR) 3 MG capsule Take 1 capsule (3 mg total) by mouth daily.   Cholecalciferol 25 MCG (1000 UT) tablet Take 1 tablet (1,000 Units total) by  mouth once a week.   diclofenac Sodium (VOLTAREN) 1 % GEL SMARTSIG:2-4 Gram(s) Topical 1-4 Times Daily PRN   FARXIGA 10 MG TABS tablet TAKE 1 TABLET(10 MG) BY MOUTH DAILY   fluticasone (FLONASE) 50 MCG/ACT nasal spray Place 2 sprays into both nostrils daily.   glucose blood (ACCU-CHEK GUIDE) test strip Use 1 test strip to check fasting glucose once daily and as need for diabetes   HYDROmorphone (DILAUDID) 4 MG tablet Take 4 mg by mouth 4 (four) times daily as needed.   insulin glargine, 2 Unit Dial, (TOUJEO MAX SOLOSTAR) 300 UNIT/ML Solostar Pen Inject 35 Units into the skin daily.   Insulin Pen Needle 31G X 5 MM MISC Use as directed  Once a day Dx e11.65   levothyroxine (SYNTHROID) 50 MCG tablet Take 1 tablet (50 mcg total) by mouth daily.   linaclotide (LINZESS) 290 MCG CAPS capsule Take 1 capsule (290 mcg total) by mouth daily before breakfast.   meclizine (ANTIVERT) 25 MG tablet Take 1 tablet (25 mg total) by mouth 3 (three) times daily as needed for dizziness.   methocarbamol (ROBAXIN) 500 MG tablet PLEASE SEE ATTACHED FOR DETAILED DIRECTIONS   morphine (MS CONTIN) 60 MG 12 hr tablet Take 60 mg by mouth every 12 (twelve) hours.   Multiple Vitamin (MULTI-VITAMIN) tablet Take 1 tablet by mouth daily.   naloxegol oxalate (MOVANTIK) 25 MG TABS tablet Take 1 tablet (25 mg total) by mouth daily.   pregabalin (LYRICA) 150 MG capsule Take 150 mg by mouth 3 (three) times daily.  RELISTOR 150 MG TABS TAKE 3 TABLETS(450 MG) BY MOUTH DAILY AS NEEDED FOR CONSTIPATION   Vitamin D, Ergocalciferol, (DRISDOL) 1.25 MG (50000 UNIT) CAPS capsule Take 1 capsule (50,000 Units total) by mouth every 7 (seven) days.   [DISCONTINUED] albuterol (VENTOLIN HFA) 108 (90 Base) MCG/ACT inhaler Inhale 1-2 puffs into the lungs every 6 (six) hours as needed for wheezing or shortness of breath.   [DISCONTINUED] ALPRAZolam (XANAX) 0.5 MG tablet Take 1 tablet (0.5 mg total) by mouth at bedtime as needed for anxiety.    [DISCONTINUED] benzonatate (TESSALON) 100 MG capsule Take 1 capsule (100 mg total) by mouth 3 (three) times daily as needed.   [DISCONTINUED] COMBIVENT RESPIMAT 20-100 MCG/ACT AERS respimat INHALE 1 PUFF INTO THE LUNGS EVERY 6 HOURS AS NEEDED FOR WHEEZING   [DISCONTINUED] FLUoxetine (PROZAC) 40 MG capsule Take 1 capsule (40 mg total) by mouth daily.   [DISCONTINUED] glucose blood (ONETOUCH VERIO) test strip Use 1 test strip to check glucose level three times daily and prn   [DISCONTINUED] ibuprofen (ADVIL) 800 MG tablet TAKE 1 TABLET(800 MG) BY MOUTH EVERY 6 HOURS AS NEEDED   [DISCONTINUED] Lancets (ONETOUCH DELICA PLUS LANCET33G) MISC Use 1 lancet to check glucose three times daily and prn   [DISCONTINUED] nystatin (MYCOSTATIN/NYSTOP) powder Apply 1 application. topically 3 (three) times daily.   [DISCONTINUED] ondansetron (ZOFRAN-ODT) 4 MG disintegrating tablet Take 1 tablet (4 mg total) by mouth every 8 (eight) hours as needed for nausea or vomiting.   [DISCONTINUED] OZEMPIC, 2 MG/DOSE, 8 MG/3ML SOPN INJECT 2MG  UNDER SKIN WEEKLY   [DISCONTINUED] pantoprazole (PROTONIX) 40 MG tablet Take 1 tablet (40 mg total) by mouth daily.   [DISCONTINUED] pravastatin (PRAVACHOL) 10 MG tablet TAKE 1 TABLET BY MOUTH EVERY DAY   [DISCONTINUED] promethazine (PHENERGAN) 25 MG tablet Take 1 tablet (25 mg total) by mouth every 8 (eight) hours as needed for nausea or vomiting.   [DISCONTINUED] rOPINIRole (REQUIP) 0.25 MG tablet Take 1-2 tabs at night for restless legs   [DISCONTINUED] traZODone (DESYREL) 50 MG tablet Take 0.5-1 tablets (25-50 mg total) by mouth at bedtime as needed for sleep.   albuterol (VENTOLIN HFA) 108 (90 Base) MCG/ACT inhaler Inhale 1-2 puffs into the lungs every 6 (six) hours as needed for wheezing or shortness of breath.   ALPRAZolam (XANAX) 0.5 MG tablet Take 1 tablet (0.5 mg total) by mouth at bedtime as needed for anxiety.   ibuprofen (ADVIL) 800 MG tablet Take 1 tablet (800 mg total) by  mouth every 8 (eight) hours as needed for moderate pain.   Ipratropium-Albuterol (COMBIVENT RESPIMAT) 20-100 MCG/ACT AERS respimat INHALE 1 PUFF INTO THE LUNGS EVERY 6 HOURS AS NEEDED FOR WHEEZING   nystatin (MYCOSTATIN/NYSTOP) powder Apply 1 Application topically 3 (three) times daily.   ondansetron (ZOFRAN-ODT) 4 MG disintegrating tablet Take 1 tablet (4 mg total) by mouth every 8 (eight) hours as needed for nausea or vomiting.   pantoprazole (PROTONIX) 40 MG tablet Take 1 tablet (40 mg total) by mouth daily.   promethazine (PHENERGAN) 25 MG tablet Take 1 tablet (25 mg total) by mouth every 8 (eight) hours as needed for nausea or vomiting.   rOPINIRole (REQUIP) 0.25 MG tablet Take 1-2 tabs at night for restless legs   Semaglutide, 2 MG/DOSE, (OZEMPIC, 2 MG/DOSE,) 8 MG/3ML SOPN INJECT 2MG  UNDER SKIN WEEKLY   traZODone (DESYREL) 50 MG tablet Take 0.5-1 tablets (25-50 mg total) by mouth at bedtime as needed for sleep.   No facility-administered encounter medications on file  as of 06/05/2023.    Surgical History: Past Surgical History:  Procedure Laterality Date   ABDOMINAL HYSTERECTOMY     CARPAL TUNNEL RELEASE     COLONOSCOPY WITH PROPOFOL N/A 05/09/2023   Procedure: COLONOSCOPY WITH PROPOFOL;  Surgeon: Wyline Mood, MD;  Location: [facility] ENDOSCOPY;  Service: Gastroenterology;  Laterality: N/A;   ESOPHAGOGASTRODUODENOSCOPY (EGD) WITH PROPOFOL N/A 05/09/2023   Procedure: ESOPHAGOGASTRODUODENOSCOPY (EGD) WITH PROPOFOL;  Surgeon: Wyline Mood, MD;  Location: [facility] ENDOSCOPY;  Service: Gastroenterology;  Laterality: N/A;   FOOT TENDON SURGERY Right    KNEE SURGERY     SPINAL FUSION     SPINAL FUSION      Medical History: Past Medical History:  Diagnosis Date   Actinic keratitis    Allergy    Anxiety    Asthma    Basal cell carcinoma    Chronic venous insufficiency    DDD (degenerative disc disease), lumbar    Diabetes mellitus without complication (HCC)    Fibromyalgia    Fibromyalgia     Hyperlipidemia    Lymphedema    Melanoma (HCC)    Migraine    Mitral valve prolapse    Sleep apnea    Tendonitis of foot    left    Family History: Family History  Problem Relation Age of Onset   Cancer Paternal Aunt    Cancer Paternal Uncle    Diabetes Paternal Grandfather    Heart disease Paternal Grandfather     Social History   Socioeconomic History   Marital status: Divorced    Spouse name: Not on file   Number of children: Not on file   Years of education: Not on file   Highest education level: Not on file  Occupational History   Not on file  Tobacco Use   Smoking status: Never   Smokeless tobacco: Never  Vaping Use   Vaping Use: Never used  Substance and Sexual Activity   Alcohol use: No   Drug use: No   Sexual activity: Not on file  Other Topics Concern   Not on file  Social History Narrative   Not on file   Social Determinants of Health   Financial Resource Strain: Not on file  Food Insecurity: Not on file  Transportation Needs: Not on file  Physical Activity: Not on file  Stress: Not on file  Social Connections: Not on file  Intimate Partner Violence: Not on file      Review of Systems  Constitutional:  Positive for fatigue. Negative for chills and unexpected weight change.  HENT:  Negative for congestion, postnasal drip, rhinorrhea, sneezing and sore throat.   Eyes:  Negative for redness.  Respiratory: Negative.  Negative for cough, chest tightness, shortness of breath and wheezing.   Cardiovascular: Negative.  Negative for chest pain and palpitations.  Gastrointestinal:  Positive for constipation and nausea. Negative for abdominal pain, diarrhea and vomiting.  Genitourinary:  Negative for dysuria and frequency.  Musculoskeletal:  Positive for arthralgias and back pain. Negative for joint swelling and neck pain.  Skin:  Negative for rash.  Neurological:  Negative for tremors and numbness.  Hematological:  Negative for adenopathy. Does not  bruise/bleed easily.  Psychiatric/Behavioral:  Positive for behavioral problems (Depression), dysphoric mood and sleep disturbance. Negative for self-injury and suicidal ideas. The patient is nervous/anxious.     Vital Signs: BP 110/68   Pulse 95   Temp 98.3 F (36.8 C)   Resp 16   Ht 5\' 1"  (1.549 m)  Wt 155 lb 6.4 oz (70.5 kg)   LMP 12/13/1991   SpO2 95%   BMI 29.36 kg/m    Physical Exam Vitals reviewed.  Constitutional:      General: She is not in acute distress.    Appearance: Normal appearance. She is obese. She is not ill-appearing.  HENT:     Head: Normocephalic and atraumatic.     Mouth/Throat:     Pharynx: No posterior oropharyngeal erythema.  Eyes:     Pupils: Pupils are equal, round, and reactive to light.  Cardiovascular:     Pulses: Normal pulses.     Heart sounds: Normal heart sounds.  Pulmonary:     Effort: Pulmonary effort is normal. No respiratory distress.     Breath sounds: Normal breath sounds. No wheezing.  Abdominal:     General: There is distension.     Palpations: Abdomen is soft.  Neurological:     Mental Status: She is alert.     Motor: No weakness.     Coordination: Coordination normal.  Psychiatric:        Attention and Perception: She is attentive.        Mood and Affect: Mood is anxious and depressed. Affect is not tearful.        Speech: Speech is delayed.        Behavior: Behavior normal. Behavior is cooperative.        Thought Content: Thought content is not paranoid or delusional. Thought content does not include homicidal or suicidal ideation.     Comments: Pt is tearful, sad and depressed        Assessment/Plan: 1. Uncontrolled type 2 diabetes mellitus with hyperglycemia (HCC) Continue farxiga and ozempic as prescribed, repeat A1c in 2 months, then will discuss if she needs to restart her insulin.  - Semaglutide, 2 MG/DOSE, (OZEMPIC, 2 MG/DOSE,) 8 MG/3ML SOPN; INJECT 2MG  UNDER SKIN WEEKLY  Dispense: 3 mL; Refill: 2 - Blood  Glucose Monitoring Suppl (ACCU-CHEK GUIDE ME) w/Device KIT; Use as directed. E11.65  Dispense: 1 kit; Refill: 0 - Accu-Chek Softclix Lancets lancets; Use 1 lancet to check glucose level once daily  Dispense: 100 each; Refill: 12 - glucose blood (ACCU-CHEK GUIDE) test strip; Use 1 test strip to check fasting glucose once daily and as need for diabetes  Dispense: 100 each; Refill: 12  2. Therapeutic opioid induced constipation Stop relistor. Start movantik as prescribed.  - naloxegol oxalate (MOVANTIK) 25 MG TABS tablet; Take 1 tablet (25 mg total) by mouth daily.  Dispense: 30 tablet; Refill: 2  3. Mixed hyperlipidemia Discontinue pravastatin. Cannot take rosuvastatin. Start atorvastatin 10 mg daily.  - atorvastatin (LIPITOR) 10 MG tablet; Take 1 tablet (10 mg total) by mouth daily.  Dispense: 30 tablet; Refill: 5  4. Encounter for medication review Medication list reviewed, updated and refills ordered  - albuterol (VENTOLIN HFA) 108 (90 Base) MCG/ACT inhaler; Inhale 1-2 puffs into the lungs every 6 (six) hours as needed for wheezing or shortness of breath.  Dispense: 18 g; Refill: 1 - Ipratropium-Albuterol (COMBIVENT RESPIMAT) 20-100 MCG/ACT AERS respimat; INHALE 1 PUFF INTO THE LUNGS EVERY 6 HOURS AS NEEDED FOR WHEEZING  Dispense: 4 g; Refill: 1 - ibuprofen (ADVIL) 800 MG tablet; Take 1 tablet (800 mg total) by mouth every 8 (eight) hours as needed for moderate pain.  Dispense: 60 tablet; Refill: 1 - nystatin (MYCOSTATIN/NYSTOP) powder; Apply 1 Application topically 3 (three) times daily.  Dispense: 60 g; Refill: 5 - ondansetron (ZOFRAN-ODT) 4 MG  disintegrating tablet; Take 1 tablet (4 mg total) by mouth every 8 (eight) hours as needed for nausea or vomiting.  Dispense: 20 tablet; Refill: 0 - pantoprazole (PROTONIX) 40 MG tablet; Take 1 tablet (40 mg total) by mouth daily.  Dispense: 90 tablet; Refill: 1 - promethazine (PHENERGAN) 25 MG tablet; Take 1 tablet (25 mg total) by mouth every 8  (eight) hours as needed for nausea or vomiting.  Dispense: 20 tablet; Refill: 0 - rOPINIRole (REQUIP) 0.25 MG tablet; Take 1-2 tabs at night for restless legs  Dispense: 180 tablet; Refill: 3 - traZODone (DESYREL) 50 MG tablet; Take 0.5-1 tablets (25-50 mg total) by mouth at bedtime as needed for sleep.  Dispense: 30 tablet; Refill: 3  5. GAD (generalized anxiety disorder) Continue prn alprazolam as prescribed.  - ALPRAZolam (XANAX) 0.5 MG tablet; Take 1 tablet (0.5 mg total) by mouth at bedtime as needed for anxiety.  Dispense: 30 tablet; Refill: 2   General Counseling: [name] [name] verbalizes understanding of the findings of todays visit and agrees with plan of treatment. I have discussed any further diagnostic evaluation that may be needed or ordered today. We also reviewed her medications today. she has been encouraged to call the office with any questions or concerns that should arise related to todays visit.    No orders of the defined types were placed in this encounter.   Meds ordered this encounter  Medications   naloxegol oxalate (MOVANTIK) 25 MG TABS tablet    Sig: Take 1 tablet (25 mg total) by mouth daily.    Dispense:  30 tablet    Refill:  2    Fill new script today please   ALPRAZolam (XANAX) 0.5 MG tablet    Sig: Take 1 tablet (0.5 mg total) by mouth at bedtime as needed for anxiety.    Dispense:  30 tablet    Refill:  2   albuterol (VENTOLIN HFA) 108 (90 Base) MCG/ACT inhaler    Sig: Inhale 1-2 puffs into the lungs every 6 (six) hours as needed for wheezing or shortness of breath.    Dispense:  18 g    Refill:  1   Ipratropium-Albuterol (COMBIVENT RESPIMAT) 20-100 MCG/ACT AERS respimat    Sig: INHALE 1 PUFF INTO THE LUNGS EVERY 6 HOURS AS NEEDED FOR WHEEZING    Dispense:  4 g    Refill:  1   ibuprofen (ADVIL) 800 MG tablet    Sig: Take 1 tablet (800 mg total) by mouth every 8 (eight) hours as needed for moderate pain.    Dispense:  60 tablet    Refill:  1    nystatin (MYCOSTATIN/NYSTOP) powder    Sig: Apply 1 Application topically 3 (three) times daily.    Dispense:  60 g    Refill:  5   ondansetron (ZOFRAN-ODT) 4 MG disintegrating tablet    Sig: Take 1 tablet (4 mg total) by mouth every 8 (eight) hours as needed for nausea or vomiting.    Dispense:  20 tablet    Refill:  0   Semaglutide, 2 MG/DOSE, (OZEMPIC, 2 MG/DOSE,) 8 MG/3ML SOPN    Sig: INJECT 2MG  UNDER SKIN WEEKLY    Dispense:  3 mL    Refill:  2   pantoprazole (PROTONIX) 40 MG tablet    Sig: Take 1 tablet (40 mg total) by mouth daily.    Dispense:  90 tablet    Refill:  1   atorvastatin (LIPITOR) 10 MG tablet    Sig:  Take 1 tablet (10 mg total) by mouth daily.    Dispense:  30 tablet    Refill:  5    Discontinue pravastatin, fill new script today.   promethazine (PHENERGAN) 25 MG tablet    Sig: Take 1 tablet (25 mg total) by mouth every 8 (eight) hours as needed for nausea or vomiting.    Dispense:  20 tablet    Refill:  0   rOPINIRole (REQUIP) 0.25 MG tablet    Sig: Take 1-2 tabs at night for restless legs    Dispense:  180 tablet    Refill:  3    Please fill as 90 day supply   traZODone (DESYREL) 50 MG tablet    Sig: Take 0.5-1 tablets (25-50 mg total) by mouth at bedtime as needed for sleep.    Dispense:  30 tablet    Refill:  3   Blood Glucose Monitoring Suppl (ACCU-CHEK GUIDE ME) w/Device KIT    Sig: Use as directed. E11.65    Dispense:  1 kit    Refill:  0    Dx code E11.65   Accu-Chek Softclix Lancets lancets    Sig: Use 1 lancet to check glucose level once daily    Dispense:  100 each    Refill:  12    Dx code E11.65   glucose blood (ACCU-CHEK GUIDE) test strip    Sig: Use 1 test strip to check fasting glucose once daily and as need for diabetes    Dispense:  100 each    Refill:  12    Dx code E11.65    Return in about 2 months (around 08/05/2023) for F/U, Recheck A1C, Numair Masden PCP.   Total time spent:30 Minutes Time spent includes review of chart,  medications, test results, and follow up plan with the patient.   [place] Controlled Substance Database was reviewed by me.  This patient was seen by Sallyanne Kuster, FNP-C in collaboration with Dr. Beverely Risen as a part of collaborative care agreement.   Jubilee Vivero R. Tedd Sias, MSN, FNP-C Internal medicine

## 2023-06-06 ENCOUNTER — Encounter: Payer: Self-pay | Admitting: Nurse Practitioner

## 2023-06-13 ENCOUNTER — Encounter: Payer: Self-pay | Admitting: Nurse Practitioner

## 2023-06-13 DIAGNOSIS — T402X5A Adverse effect of other opioids, initial encounter: Secondary | ICD-10-CM

## 2023-06-13 DIAGNOSIS — R195 Other fecal abnormalities: Secondary | ICD-10-CM

## 2023-06-19 ENCOUNTER — Ambulatory Visit: Payer: Medicare Other | Admitting: Physician Assistant

## 2023-06-25 DIAGNOSIS — M797 Fibromyalgia: Secondary | ICD-10-CM | POA: Diagnosis not present

## 2023-06-25 DIAGNOSIS — M25562 Pain in left knee: Secondary | ICD-10-CM | POA: Diagnosis not present

## 2023-06-25 DIAGNOSIS — M5431 Sciatica, right side: Secondary | ICD-10-CM | POA: Diagnosis not present

## 2023-06-25 DIAGNOSIS — M545 Low back pain, unspecified: Secondary | ICD-10-CM | POA: Diagnosis not present

## 2023-06-25 DIAGNOSIS — M62838 Other muscle spasm: Secondary | ICD-10-CM | POA: Diagnosis not present

## 2023-06-25 DIAGNOSIS — G8929 Other chronic pain: Secondary | ICD-10-CM | POA: Diagnosis not present

## 2023-07-03 ENCOUNTER — Ambulatory Visit (INDEPENDENT_AMBULATORY_CARE_PROVIDER_SITE_OTHER): Payer: Medicare Other | Admitting: Gastroenterology

## 2023-07-12 ENCOUNTER — Encounter (INDEPENDENT_AMBULATORY_CARE_PROVIDER_SITE_OTHER): Payer: Self-pay | Admitting: Gastroenterology

## 2023-07-12 ENCOUNTER — Ambulatory Visit (INDEPENDENT_AMBULATORY_CARE_PROVIDER_SITE_OTHER): Payer: Medicare Other | Admitting: Gastroenterology

## 2023-07-12 VITALS — BP 95/66 | HR 89 | Temp 97.7°F | Ht 61.0 in | Wt 155.5 lb

## 2023-07-12 DIAGNOSIS — K5909 Other constipation: Secondary | ICD-10-CM

## 2023-07-12 DIAGNOSIS — R140 Abdominal distension (gaseous): Secondary | ICD-10-CM

## 2023-07-12 DIAGNOSIS — K5900 Constipation, unspecified: Secondary | ICD-10-CM | POA: Insufficient documentation

## 2023-07-12 DIAGNOSIS — K5904 Chronic idiopathic constipation: Secondary | ICD-10-CM

## 2023-07-12 DIAGNOSIS — Z79891 Long term (current) use of opiate analgesic: Secondary | ICD-10-CM | POA: Diagnosis not present

## 2023-07-12 DIAGNOSIS — Z1212 Encounter for screening for malignant neoplasm of rectum: Secondary | ICD-10-CM | POA: Insufficient documentation

## 2023-07-12 MED ORDER — LUBIPROSTONE 24 MCG PO CAPS: 24.0000 ug | ORAL_CAPSULE | Freq: Two times a day (BID) | ORAL | 3 refills | Status: DC

## 2023-07-12 NOTE — Patient Instructions (Signed)
Schedule colonoscopy - will need to take a bowel prep the week before (slow pace in 48 hours) and then a second bowel prep starting the day before colonoscopy Continue Movantik 25 mg qday Continue Psyllium daily Start Amitiza 24 mcg twice a day

## 2023-07-12 NOTE — Progress Notes (Signed)
[name], M.D. Gastroenterology & Hepatology [facility] Gastroenterology [address] [place], [place] [address] Primary Care Physician: Sallyanne Kuster, NP [address] [place] [address]  Referring MD: PCP  Chief Complaint: Positive Cologuard and chronic constipation  History of Present Illness: [name] [name] is a 47 y.o. female who presents for evaluation of positive Cologuard and chronic constipation.  Patient had a positive Cologuard on 03/30/2023.  Patient reports that for the last 3 years she has had constipation. She reports that she has a bowel movement every 3 days in average. States it is a Estate agent 1. She is taking Movantik 25 mg qday. She started taking psyllium daily, which helped increasing her Bms. Has significant bloating, but she has improvement when defecating.She also has episodes of lower abdominal pain, which improves after she defecates.  She took Linzess in the past, but stopped it as she did not have any improvement, even with 290 mcg qday dosing. She stopped this maybe around June 2024. Has not tried any other prescription laxatives. Has tried Miralax in the past without improvement.  The patient denies having any nausea, vomiting, fever, chills, hematochezia, melena, hematemesis,diarrhea, jaundice, pruritus or weight loss. Has seen some black stools occasionally.  Patient has been taking opiates for multiple years. She is taking morphine 60 mg BID and Dilaudid 4 mg every 6 hours.  Notably, the patient underwent a screening colonoscopy after having a positive Cologuard with Dr. Tobi Bastos on 05/09/2023.  However, the patient had large amount of stool that did not allow adequate visualization and the procedure was aborted.  Last EGD: 05/09/2023, normal esophagus, stomach, duodenum, . Last Colonoscopy: As above, did not have any prior episode  Past Medical History: Past Medical History:  Diagnosis Date   Actinic keratitis     Allergy    Anxiety    Asthma    Basal cell carcinoma    Chronic venous insufficiency    DDD (degenerative disc disease), lumbar    Diabetes mellitus without complication (HCC)    Fibromyalgia    Fibromyalgia    Hyperlipidemia    Lymphedema    Melanoma (HCC)    Migraine    Mitral valve prolapse    Sleep apnea    Tendonitis of foot    left    Past Surgical History: Past Surgical History:  Procedure Laterality Date   ABDOMINAL HYSTERECTOMY     CARPAL TUNNEL RELEASE     COLONOSCOPY WITH PROPOFOL N/A 05/09/2023   Procedure: COLONOSCOPY WITH PROPOFOL;  Surgeon: Wyline Mood, MD;  Location: [facility] ENDOSCOPY;  Service: Gastroenterology;  Laterality: N/A;   ESOPHAGOGASTRODUODENOSCOPY (EGD) WITH PROPOFOL N/A 05/09/2023   Procedure: ESOPHAGOGASTRODUODENOSCOPY (EGD) WITH PROPOFOL;  Surgeon: Wyline Mood, MD;  Location: [facility] ENDOSCOPY;  Service: Gastroenterology;  Laterality: N/A;   FOOT TENDON SURGERY Right    KNEE SURGERY     SPINAL FUSION     SPINAL FUSION      Family History: Family History  Problem Relation Age of Onset   Cancer Paternal Aunt    Cancer Paternal Uncle    Diabetes Paternal Grandfather    Heart disease Paternal Grandfather     Social History: Social History   Tobacco Use  Smoking Status Never   Passive exposure: Past  Smokeless Tobacco Never   Social History   Substance and Sexual Activity  Alcohol Use No   Social History   Substance and Sexual Activity  Drug Use No    Allergies: Allergies  Allergen  Reactions   Oxycodone Hcl Nausea And Vomiting   Pollen Extract Other (See Comments)    Congestion, sneezing   Topiramate Other (See Comments)    Jerking  Jerking  Jerking  Other reaction(s): Other (See Comments) Jerking     Bee Venom Hives, Itching and Swelling   Buspirone    Crestor [Rosuvastatin] Other (See Comments)    Muscle spasms   Doxycycline Nausea Only   Mirtazapine    Oxycontin [Oxycodone] Nausea Only   Wellbutrin [Bupropion]      Medications: Current Outpatient Medications  Medication Sig Dispense Refill   Accu-Chek Softclix Lancets lancets Use 1 lancet to check glucose level once daily 100 each 12   albuterol (VENTOLIN HFA) 108 (90 Base) MCG/ACT inhaler Inhale 1-2 puffs into the lungs every 6 (six) hours as needed for wheezing or shortness of breath. 18 g 1   ALPRAZolam (XANAX) 0.5 MG tablet Take 1 tablet (0.5 mg total) by mouth at bedtime as needed for anxiety. 30 tablet 2   ammonium lactate (LAC-HYDRIN) 12 % lotion Apply 1 application topically as needed for dry skin. 400 g 0   atorvastatin (LIPITOR) 10 MG tablet Take 1 tablet (10 mg total) by mouth daily. 30 tablet 5   Blood Glucose Monitoring Suppl (ACCU-CHEK GUIDE ME) w/Device KIT Use as directed. E11.65 1 kit 0   cariprazine (VRAYLAR) 3 MG capsule Take 1 capsule (3 mg total) by mouth daily. 30 capsule 5   Cholecalciferol 25 MCG (1000 UT) tablet Take 1 tablet (1,000 Units total) by mouth once a week. 15 tablet 0   diclofenac Sodium (VOLTAREN) 1 % GEL SMARTSIG:2-4 Gram(s) Topical 1-4 Times Daily PRN     FARXIGA 10 MG TABS tablet TAKE 1 TABLET(10 MG) BY MOUTH DAILY 90 tablet 1   fluticasone (FLONASE) 50 MCG/ACT nasal spray Place 2 sprays into both nostrils daily. 16 g 5   glucose blood (ACCU-CHEK GUIDE) test strip Use 1 test strip to check fasting glucose once daily and as need for diabetes 100 each 12   HYDROmorphone (DILAUDID) 4 MG tablet Take 4 mg by mouth 4 (four) times daily as needed.     ibuprofen (ADVIL) 800 MG tablet Take 1 tablet (800 mg total) by mouth every 8 (eight) hours as needed for moderate pain. 60 tablet 1   Insulin Pen Needle 31G X 5 MM MISC Use as directed  Once a day Dx e11.65 100 each 3   Ipratropium-Albuterol (COMBIVENT RESPIMAT) 20-100 MCG/ACT AERS respimat INHALE 1 PUFF INTO THE LUNGS EVERY 6 HOURS AS NEEDED FOR WHEEZING 4 g 1   levothyroxine (SYNTHROID) 50 MCG tablet Take 1 tablet (50 mcg total) by mouth daily. 90 tablet 1   meclizine  (ANTIVERT) 25 MG tablet Take 1 tablet (25 mg total) by mouth 3 (three) times daily as needed for dizziness. 90 tablet 2   methocarbamol (ROBAXIN) 500 MG tablet PLEASE SEE ATTACHED FOR DETAILED DIRECTIONS     morphine (MS CONTIN) 60 MG 12 hr tablet Take 60 mg by mouth every 12 (twelve) hours.     Multiple Vitamin (MULTI-VITAMIN) tablet Take 1 tablet by mouth daily.     naloxegol oxalate (MOVANTIK) 25 MG TABS tablet Take 1 tablet (25 mg total) by mouth daily. 30 tablet 2   nystatin (MYCOSTATIN/NYSTOP) powder Apply 1 Application topically 3 (three) times daily. 60 g 5   ondansetron (ZOFRAN-ODT) 4 MG disintegrating tablet Take 1 tablet (4 mg total) by mouth every 8 (eight) hours as needed for nausea  or vomiting. 20 tablet 0   OVER THE COUNTER MEDICATION Anthony's organic psyllium husk powder. Takes daily     pantoprazole (PROTONIX) 40 MG tablet Take 1 tablet (40 mg total) by mouth daily. 90 tablet 1   pregabalin (LYRICA) 150 MG capsule Take 150 mg by mouth 3 (three) times daily.      promethazine (PHENERGAN) 25 MG tablet Take 1 tablet (25 mg total) by mouth every 8 (eight) hours as needed for nausea or vomiting. 20 tablet 0   rOPINIRole (REQUIP) 0.25 MG tablet Take 1-2 tabs at night for restless legs 180 tablet 3   Semaglutide, 2 MG/DOSE, (OZEMPIC, 2 MG/DOSE,) 8 MG/3ML SOPN INJECT 2MG  UNDER SKIN WEEKLY 3 mL 2   traZODone (DESYREL) 50 MG tablet Take 0.5-1 tablets (25-50 mg total) by mouth at bedtime as needed for sleep. 30 tablet 3   Vitamin D, Ergocalciferol, (DRISDOL) 1.25 MG (50000 UNIT) CAPS capsule Take 1 capsule (50,000 Units total) by mouth every 7 (seven) days. 15 capsule 1   No current facility-administered medications for this visit.    Review of Systems: GENERAL: negative for malaise, night sweats HEENT: No changes in hearing or vision, no nose bleeds or other nasal problems. NECK: Negative for lumps, goiter, pain and significant neck swelling RESPIRATORY: Negative for cough,  wheezing CARDIOVASCULAR: Negative for chest pain, leg swelling, palpitations, orthopnea GI: SEE HPI MUSCULOSKELETAL: Negative for joint pain or swelling, back pain, and muscle pain. SKIN: Negative for lesions, rash PSYCH: Negative for sleep disturbance, mood disorder and recent psychosocial stressors. HEMATOLOGY Negative for prolonged bleeding, bruising easily, and swollen nodes. ENDOCRINE: Negative for cold or heat intolerance, polyuria, polydipsia and goiter. NEURO: negative for tremor, gait imbalance, syncope and seizures. The remainder of the review of systems is noncontributory.   Physical Exam: BP 95/66 (BP Location: Left Arm, Patient Position: Sitting, Cuff Size: Normal)   Pulse 89   Temp 97.7 F (36.5 C) (Oral)   Ht 5\' 1"  (1.549 m)   Wt 155 lb 8 oz (70.5 kg)   LMP 12/13/1991   BMI 29.38 kg/m  GENERAL: The patient is AO x3, in no acute distress. HEENT: Head is normocephalic and atraumatic. EOMI are intact. Mouth is well hydrated and without lesions. NECK: Supple. No masses LUNGS: Clear to auscultation. No presence of rhonchi/wheezing/rales. Adequate chest expansion HEART: RRR, normal s1 and s2. ABDOMEN: Soft, nontender, no guarding, no peritoneal signs, and nondistended. BS +. No masses. EXTREMITIES: Without any cyanosis, clubbing, rash, lesions or edema. NEUROLOGIC: AOx3, no focal motor deficit. SKIN: no jaundice, no rashes   Imaging/Labs: as above  I personally reviewed and interpreted the available labs, imaging and endoscopic files.  Impression and Plan: AHONESTY VIELE is a 47 y.o. female who presents for evaluation of positive Cologuard and chronic constipation.  The patient has presented a chronic history of constipation in the setting of high-dose chronic opiate intake.  She has tried multiple over-the-counter laxatives without improvement and is currently on Movantik.  She had some improvement after she added psyllium to her regimen but is still presenting  constipation and bloating.  We discussed about the possibility of adding a different prescription laxatives such as Amitiza to her regimen which she is agreeable to proceed with.  We will schedule her for a colonoscopy given positive Cologuard.  Will give her a long bowel prep for this.  - Schedule colonoscopy - will need to take a bowel prep the week before and then a second bowel prep starting the  day before colonoscopy - Continue Movantik 25 mg qday - Continue Psyllium daily - Start Amitiza 24 mcg twice a day  All questions were answered.      [name], MD Gastroenterology and Hepatology [facility] Gastroenterology

## 2023-07-21 DIAGNOSIS — M62838 Other muscle spasm: Secondary | ICD-10-CM | POA: Diagnosis not present

## 2023-07-21 DIAGNOSIS — M5431 Sciatica, right side: Secondary | ICD-10-CM | POA: Diagnosis not present

## 2023-07-21 DIAGNOSIS — M25562 Pain in left knee: Secondary | ICD-10-CM | POA: Diagnosis not present

## 2023-07-21 DIAGNOSIS — G8929 Other chronic pain: Secondary | ICD-10-CM | POA: Diagnosis not present

## 2023-07-21 DIAGNOSIS — M545 Low back pain, unspecified: Secondary | ICD-10-CM | POA: Diagnosis not present

## 2023-07-21 DIAGNOSIS — M797 Fibromyalgia: Secondary | ICD-10-CM | POA: Diagnosis not present

## 2023-08-07 ENCOUNTER — Ambulatory Visit (INDEPENDENT_AMBULATORY_CARE_PROVIDER_SITE_OTHER): Payer: Medicare Other | Admitting: Nurse Practitioner

## 2023-08-07 ENCOUNTER — Encounter: Payer: Self-pay | Admitting: Nurse Practitioner

## 2023-08-07 VITALS — BP 110/80 | HR 83 | Temp 98.4°F | Resp 16 | Ht 61.0 in | Wt 150.8 lb

## 2023-08-07 DIAGNOSIS — G4733 Obstructive sleep apnea (adult) (pediatric): Secondary | ICD-10-CM

## 2023-08-07 DIAGNOSIS — E1165 Type 2 diabetes mellitus with hyperglycemia: Secondary | ICD-10-CM | POA: Diagnosis not present

## 2023-08-07 DIAGNOSIS — F411 Generalized anxiety disorder: Secondary | ICD-10-CM

## 2023-08-07 LAB — POCT GLYCOSYLATED HEMOGLOBIN (HGB A1C): Hemoglobin A1C: 6.2 % — AB (ref 4.0–5.6)

## 2023-08-07 MED ORDER — ALPRAZOLAM 0.5 MG PO TABS
0.5000 mg | ORAL_TABLET | Freq: Every evening | ORAL | 2 refills | Status: DC | PRN
Start: 2023-08-07 — End: 2023-11-28

## 2023-08-07 NOTE — Progress Notes (Signed)
[facility] [address] [place], [place] [address]  Internal MEDICINE  Office Visit Note  Patient Name: [name] [name]  [id number]  [id number]  Date of Service: 08/07/2023  Chief Complaint  Patient presents with   Follow-up   Diabetes   Hyperlipidemia    HPI [name] [name] presents for a follow-up visit for diabetes, OSA, and GAD Diabetes -- improving down to 6.2 today from 6.6 in April.  OSA treated with BiPAP -- has been using [facility] for DME supplies but insurance is [facility] and they are switching to [facility]. GAD -- takes prn alprazolam   Current Medication: Outpatient Encounter Medications as of 08/07/2023  Medication Sig   Accu-Chek Softclix Lancets lancets Use 1 lancet to check glucose level once daily   albuterol (VENTOLIN HFA) 108 (90 Base) MCG/ACT inhaler Inhale 1-2 puffs into the lungs every 6 (six) hours as needed for wheezing or shortness of breath.   ammonium lactate (LAC-HYDRIN) 12 % lotion Apply 1 application topically as needed for dry skin.   atorvastatin (LIPITOR) 10 MG tablet Take 1 tablet (10 mg total) by mouth daily.   Blood Glucose Monitoring Suppl (ACCU-CHEK GUIDE ME) w/Device KIT Use as directed. E11.65   cariprazine (VRAYLAR) 3 MG capsule Take 1 capsule (3 mg total) by mouth daily.   Cholecalciferol 25 MCG (1000 UT) tablet Take 1 tablet (1,000 Units total) by mouth once a week.   diclofenac Sodium (VOLTAREN) 1 % GEL SMARTSIG:2-4 Gram(s) Topical 1-4 Times Daily PRN   FARXIGA 10 MG TABS tablet TAKE 1 TABLET(10 MG) BY MOUTH DAILY   fluticasone (FLONASE) 50 MCG/ACT nasal spray Place 2 sprays into both nostrils daily.   glucose blood (ACCU-CHEK GUIDE) test strip Use 1 test strip to check fasting glucose once daily and as need for diabetes   HYDROmorphone (DILAUDID) 4 MG tablet Take 4 mg by mouth 4 (four) times daily as needed.   ibuprofen (ADVIL) 800 MG tablet Take 1 tablet (800 mg total) by mouth every 8 (eight) hours as needed for  moderate pain.   Insulin Pen Needle 31G X 5 MM MISC Use as directed  Once a day Dx e11.65   Ipratropium-Albuterol (COMBIVENT RESPIMAT) 20-100 MCG/ACT AERS respimat INHALE 1 PUFF INTO THE LUNGS EVERY 6 HOURS AS NEEDED FOR WHEEZING   levothyroxine (SYNTHROID) 50 MCG tablet Take 1 tablet (50 mcg total) by mouth daily.   lubiprostone (AMITIZA) 24 MCG capsule Take 1 capsule (24 mcg total) by mouth 2 (two) times daily with a meal.   meclizine (ANTIVERT) 25 MG tablet Take 1 tablet (25 mg total) by mouth 3 (three) times daily as needed for dizziness.   methocarbamol (ROBAXIN) 500 MG tablet PLEASE SEE ATTACHED FOR DETAILED DIRECTIONS   morphine (MS CONTIN) 60 MG 12 hr tablet Take 60 mg by mouth every 12 (twelve) hours.   Multiple Vitamin (MULTI-VITAMIN) tablet Take 1 tablet by mouth daily.   naloxegol oxalate (MOVANTIK) 25 MG TABS tablet Take 1 tablet (25 mg total) by mouth daily.   nystatin (MYCOSTATIN/NYSTOP) powder Apply 1 Application topically 3 (three) times daily.   ondansetron (ZOFRAN-ODT) 4 MG disintegrating tablet Take 1 tablet (4 mg total) by mouth every 8 (eight) hours as needed for nausea or vomiting.   OVER THE COUNTER MEDICATION [name] [name]'s organic psyllium husk powder. Takes daily   pantoprazole (PROTONIX) 40 MG tablet Take 1 tablet (40 mg total) by mouth daily.   pregabalin (LYRICA) 150 MG capsule Take 150 mg by mouth 3 (three) times  daily.    promethazine (PHENERGAN) 25 MG tablet Take 1 tablet (25 mg total) by mouth every 8 (eight) hours as needed for nausea or vomiting.   rOPINIRole (REQUIP) 0.25 MG tablet Take 1-2 tabs at night for restless legs   Semaglutide, 2 MG/DOSE, (OZEMPIC, 2 MG/DOSE,) 8 MG/3ML SOPN INJECT 2MG  UNDER SKIN WEEKLY   traZODone (DESYREL) 50 MG tablet Take 0.5-1 tablets (25-50 mg total) by mouth at bedtime as needed for sleep.   Vitamin D, Ergocalciferol, (DRISDOL) 1.25 MG (50000 UNIT) CAPS capsule Take 1 capsule (50,000 Units total) by mouth every 7 (seven) days.    [DISCONTINUED] ALPRAZolam (XANAX) 0.5 MG tablet Take 1 tablet (0.5 mg total) by mouth at bedtime as needed for anxiety.   ALPRAZolam (XANAX) 0.5 MG tablet Take 1 tablet (0.5 mg total) by mouth at bedtime as needed for anxiety.   No facility-administered encounter medications on file as of 08/07/2023.    Surgical History: Past Surgical History:  Procedure Laterality Date   ABDOMINAL HYSTERECTOMY     CARPAL TUNNEL RELEASE     COLONOSCOPY WITH PROPOFOL N/A 05/09/2023   Procedure: COLONOSCOPY WITH PROPOFOL;  Surgeon: Wyline Mood, MD;  [facility] ENDOSCOPY;  Service: Gastroenterology;  Laterality: N/A;   ESOPHAGOGASTRODUODENOSCOPY (EGD) WITH PROPOFOL N/A 05/09/2023   Procedure: ESOPHAGOGASTRODUODENOSCOPY (EGD) WITH PROPOFOL;  Surgeon: Wyline Mood, MD;  Location: [facility] ENDOSCOPY;  Service: Gastroenterology;  Laterality: N/A;   FOOT TENDON SURGERY Right    KNEE SURGERY     SPINAL FUSION     SPINAL FUSION      Medical History: Past Medical History:  Diagnosis Date   Actinic keratitis    Allergy    Anxiety    Asthma    Basal cell carcinoma    Chronic venous insufficiency    DDD (degenerative disc disease), lumbar    Diabetes mellitus without complication (HCC)    Fibromyalgia    Fibromyalgia    Hyperlipidemia    Lymphedema    Melanoma (HCC)    Migraine    Mitral valve prolapse    Sleep apnea    Tendonitis of foot    left    Family History: Family History  Problem Relation Age of Onset   Cancer Paternal Aunt    Cancer Paternal Uncle    Diabetes Paternal Grandfather    Heart disease Paternal Grandfather     Social History   Socioeconomic History   Marital status: Divorced    Spouse name: Not on file   Number of children: Not on file   Years of education: Not on file   Highest education level: Not on file  Occupational History   Not on file  Tobacco Use   Smoking status: Never    Passive exposure: Past   Smokeless tobacco: Never  Vaping Use   Vaping status:  Never Used  Substance and Sexual Activity   Alcohol use: No   Drug use: No   Sexual activity: Not on file  Other Topics Concern   Not on file  Social History Narrative   Not on file   Social Determinants of Health   Financial Resource Strain: Not on file  Food Insecurity: No Food Insecurity (04/07/2022)   Received from [facility], [facility]   Hunger Vital Sign    Worried About Running Out of Food in the Last Year: Never true    Ran Out of Food in the Last Year: Never true  Transportation Needs: Not on file  Physical Activity: Not  on file  Stress: Not on file  Social Connections: Unknown (04/09/2022)   Received from [facility], [facility]   Social Network    Social Network: Not on file  Intimate Partner Violence: Unknown (03/13/2022)   [facility] from [facility], [facility]   HITS    Physically Hurt: Not on file    Insult or Talk Down To: Not on file    Threaten Physical Harm: Not on file    Scream or Curse: Not on file      Review of Systems  Constitutional:  Positive for fatigue. Negative for chills and unexpected weight change.  HENT:  Negative for congestion, postnasal drip, rhinorrhea, sneezing and sore throat.   Eyes:  Negative for redness.  Respiratory: Negative.  Negative for cough, chest tightness, shortness of breath and wheezing.   Cardiovascular: Negative.  Negative for chest pain and palpitations.  Gastrointestinal:  Positive for constipation and nausea. Negative for abdominal pain, diarrhea and vomiting.  Genitourinary:  Negative for dysuria and frequency.  Musculoskeletal:  Positive for arthralgias and back pain. Negative for joint swelling and neck pain.  Skin:  Negative for rash.  Neurological:  Negative for tremors and numbness.  Hematological:  Negative for adenopathy. Does not bruise/bleed easily.  Psychiatric/Behavioral:  Positive for behavioral problems (Depression), dysphoric mood and sleep disturbance. Negative for self-injury and  suicidal ideas. The patient is nervous/anxious.     Vital Signs: BP 110/80   Pulse 83   Temp 98.4 F (36.9 C)   Resp 16   Ht 5\' 1"  (1.549 m)   Wt 150 lb 12.8 oz (68.4 kg)   LMP 12/13/1991   SpO2 96%   BMI 28.49 kg/m    Physical Exam Vitals reviewed.  Constitutional:      General: She is not in acute distress.    Appearance: Normal appearance. She is obese. She is not ill-appearing.  HENT:     Head: Normocephalic and atraumatic.     Mouth/Throat:     Pharynx: No posterior oropharyngeal erythema.  Eyes:     Pupils: Pupils are equal, round, and reactive to light.  Cardiovascular:     Pulses: Normal pulses.     Heart sounds: Normal heart sounds.  Pulmonary:     Effort: Pulmonary effort is normal. No respiratory distress.     Breath sounds: Normal breath sounds. No wheezing.  Abdominal:     General: There is no distension.     Palpations: Abdomen is soft.  Neurological:     Mental Status: She is alert.     Motor: No weakness.     Coordination: Coordination normal.  Psychiatric:        Attention and Perception: She is attentive.        Mood and Affect: Mood is anxious and depressed. Affect is not tearful.        Speech: Speech is delayed.        Behavior: Behavior normal. Behavior is cooperative.        Thought Content: Thought content is not paranoid or delusional. Thought content does not include homicidal or suicidal ideation.        Assessment/Plan: 1. Uncontrolled type 2 diabetes mellitus with hyperglycemia (HCC) Improving, A1c 6.2 today. Continue medications as prescribed. No changes.  - POCT glycosylated hemoglobin (Hb A1C)  2. OSA treated with BiPAP New bipap order sent to [facility]. - For home use only DME Bipap  3. GAD (generalized anxiety disorder) Continue alprazolam as prescribed  -  ALPRAZolam (XANAX) 0.5 MG tablet; Take 1 tablet (0.5 mg total) by mouth at bedtime as needed for anxiety.  Dispense: 30 tablet; Refill: 2   General  Counseling: Donda verbalizes understanding of the findings of todays visit and agrees with plan of treatment. I have discussed any further diagnostic evaluation that may be needed or ordered today. We also reviewed her medications today. she has been encouraged to call the office with any questions or concerns that should arise related to todays visit.    Orders Placed This Encounter  Procedures   For home use only DME Bipap   POCT glycosylated hemoglobin (Hb A1C)    Meds ordered this encounter  Medications   ALPRAZolam (XANAX) 0.5 MG tablet    Sig: Take 1 tablet (0.5 mg total) by mouth at bedtime as needed for anxiety.    Dispense:  30 tablet    Refill:  2    Return in about 3 months (around 10/31/2023) for F/U, anxiety med refill, Imunique Samad PCP.   Total time spent:30 Minutes Time spent includes review of chart, medications, test results, and follow up plan with the patient.   [place] Controlled Substance Database was reviewed by me.  This patient was seen by Sallyanne Kuster, FNP-C in collaboration with Dr. Beverely Risen as a part of collaborative care agreement.   Auriel Kist R. Tedd Sias, MSN, FNP-C Internal medicine

## 2023-08-08 ENCOUNTER — Telehealth: Payer: Self-pay | Admitting: Nurse Practitioner

## 2023-08-08 NOTE — Telephone Encounter (Signed)
emailed bipap supply order to [facility]; [email or web address] -Sheralyn Boatman

## 2023-08-10 ENCOUNTER — Telehealth: Payer: Self-pay

## 2023-08-10 NOTE — Telephone Encounter (Signed)
Pt was notified about eye exam being negative. No retinopathy.

## 2023-08-21 DIAGNOSIS — G8929 Other chronic pain: Secondary | ICD-10-CM | POA: Diagnosis not present

## 2023-08-21 DIAGNOSIS — M62838 Other muscle spasm: Secondary | ICD-10-CM | POA: Diagnosis not present

## 2023-08-21 DIAGNOSIS — M25562 Pain in left knee: Secondary | ICD-10-CM | POA: Diagnosis not present

## 2023-08-21 DIAGNOSIS — M5431 Sciatica, right side: Secondary | ICD-10-CM | POA: Diagnosis not present

## 2023-08-21 DIAGNOSIS — M545 Low back pain, unspecified: Secondary | ICD-10-CM | POA: Diagnosis not present

## 2023-08-21 DIAGNOSIS — M797 Fibromyalgia: Secondary | ICD-10-CM | POA: Diagnosis not present

## 2023-09-05 DIAGNOSIS — M1712 Unilateral primary osteoarthritis, left knee: Secondary | ICD-10-CM | POA: Diagnosis not present

## 2023-09-09 ENCOUNTER — Encounter: Payer: Self-pay | Admitting: Nurse Practitioner

## 2023-09-09 DIAGNOSIS — G4733 Obstructive sleep apnea (adult) (pediatric): Secondary | ICD-10-CM | POA: Insufficient documentation

## 2023-09-09 DIAGNOSIS — F411 Generalized anxiety disorder: Secondary | ICD-10-CM | POA: Insufficient documentation

## 2023-09-17 ENCOUNTER — Telehealth (INDEPENDENT_AMBULATORY_CARE_PROVIDER_SITE_OTHER): Payer: Self-pay | Admitting: Gastroenterology

## 2023-09-17 MED ORDER — PEG 3350-KCL-NA BICARB-NACL 420 G PO SOLR: 4000.0000 mL | Freq: Once | ORAL | 0 refills | Status: AC

## 2023-09-17 NOTE — Telephone Encounter (Signed)
Pt left message in regards to being scheduled for TCS. Pt was seen 07/12/23. Returned call to patient and scheduled her for 10/09/23. 2 prep kits sent to pharmacy. Instructions sent via my chart. Will send my chart message with pre op

## 2023-09-18 ENCOUNTER — Encounter (INDEPENDENT_AMBULATORY_CARE_PROVIDER_SITE_OTHER): Payer: Self-pay

## 2023-09-18 DIAGNOSIS — M5431 Sciatica, right side: Secondary | ICD-10-CM | POA: Diagnosis not present

## 2023-09-18 DIAGNOSIS — M797 Fibromyalgia: Secondary | ICD-10-CM | POA: Diagnosis not present

## 2023-09-18 DIAGNOSIS — M62838 Other muscle spasm: Secondary | ICD-10-CM | POA: Diagnosis not present

## 2023-09-18 DIAGNOSIS — G8929 Other chronic pain: Secondary | ICD-10-CM | POA: Diagnosis not present

## 2023-09-18 DIAGNOSIS — M25562 Pain in left knee: Secondary | ICD-10-CM | POA: Diagnosis not present

## 2023-09-18 DIAGNOSIS — M545 Low back pain, unspecified: Secondary | ICD-10-CM | POA: Diagnosis not present

## 2023-09-21 ENCOUNTER — Encounter: Payer: Self-pay | Admitting: Nurse Practitioner

## 2023-09-21 ENCOUNTER — Other Ambulatory Visit: Payer: Self-pay | Admitting: Nurse Practitioner

## 2023-09-21 DIAGNOSIS — Z79899 Other long term (current) drug therapy: Secondary | ICD-10-CM

## 2023-09-21 DIAGNOSIS — F16283 Hallucinogen dependence with hallucinogen persisting perception disorder (flashbacks): Secondary | ICD-10-CM

## 2023-09-23 MED ORDER — TRAZODONE HCL 50 MG PO TABS: 25.0000 mg | ORAL_TABLET | Freq: Every evening | ORAL | 3 refills | Status: DC | PRN

## 2023-09-23 MED ORDER — CARIPRAZINE HCL 3 MG PO CAPS: 3.0000 mg | ORAL_CAPSULE | Freq: Every day | ORAL | 5 refills | Status: DC

## 2023-09-23 MED ORDER — PANTOPRAZOLE SODIUM 40 MG PO TBEC
40.0000 mg | DELAYED_RELEASE_TABLET | Freq: Every day | ORAL | 1 refills | Status: DC
Start: 2023-09-23 — End: 2024-03-31

## 2023-09-23 NOTE — Addendum Note (Signed)
Addended by: Sallyanne Kuster on: 09/23/2023 10:12 AM   Modules accepted: Orders

## 2023-10-03 ENCOUNTER — Encounter (INDEPENDENT_AMBULATORY_CARE_PROVIDER_SITE_OTHER): Payer: Self-pay | Admitting: Gastroenterology

## 2023-10-04 NOTE — Patient Instructions (Signed)
[name] [name]  10/04/2023     @PREFPERIOPPHARMACY @   Your procedure is scheduled on  10/09/2023.   Report to [facility] at  1045  A.M.   Call this number if you have problems the morning of surgery:  [phone]  If you experience any cold or flu symptoms such as cough, fever, chills, shortness of breath, etc. between now and your scheduled surgery, please notify us at the above number.   Remember:      Your last dose of semaglutide should have been on 10/01/2023.     Your last dose of farxiga should be on 10/05/2023.      DO NOT take any medications for diabetes the morning of your procedure.    Follow the diet and prep instructions given to you by the office.   You may drink clear liquids until 0845 am on 10/09/2023.    Clear liquids allowed are:                    Water, Carbonated beverages (diabetics please choose diet or no sugar options), Black Coffee Only (No creamer, milk or cream, including half & half and powdered creamer), and Clear Sports drink (No red color; diabetics please choose diet or no sugar options)    Take these medicines the morning of surgery with A SIP OF WATER         cariprazine, levothyroxine, meclizine(if needed), M S Contin(if needed), pregabalin, zofran (if needed).    Do not wear jewelry, make-up or nail polish, including gel polish,  artificial nails, or any other type of covering on natural nails (fingers and  toes).  Do not wear lotions, powders, or perfumes, or deodorant.  Do not shave 48 hours prior to surgery.  Men may shave face and neck.  Do not bring valuables to the hospital.  [facility] is not responsible for any belongings or valuables.  Contacts, dentures or bridgework may not be worn into surgery.  Leave your suitcase in the car.  After surgery it may be brought to your room.  For patients admitted to the hospital, discharge time will be determined by your treatment team.  Patients discharged the day of  surgery will not be allowed to drive home and must have someone with them for 24 hours.    Special instructions:   DO NOT smoke tobacco or vape for 24 hours before your procedure.  Please read over the following fact sheets that you were given. Anesthesia Post-op Instructions and Care and Recovery After Surgery      Colonoscopy, Adult, Care After The following information offers guidance on how to care for yourself after your procedure. Your health care provider may also give you more specific instructions. If you have problems or questions, contact your health care provider. What can I expect after the procedure? After the procedure, it is common to have: A small amount of blood in your stool for 24 hours after the procedure. Some gas. Mild cramping or bloating of your abdomen. Follow these instructions at home: Eating and drinking  Drink enough fluid to keep your urine pale yellow. Follow instructions from your health care provider about eating or drinking restrictions. Resume your normal diet as told by your health care provider. Avoid heavy or fried foods that are hard to digest. Activity Rest as told by your health care provider. Avoid sitting for a long time without moving. Get up to take short walks every  1-2 hours. This is important to improve blood flow and breathing. Ask for help if you feel weak or unsteady. Return to your normal activities as told by your health care provider. Ask your health care provider what activities are safe for you. Managing cramping and bloating  Try walking around when you have cramps or feel bloated. If directed, apply heat to your abdomen as told by your health care provider. Use the heat source that your health care provider recommends, such as a moist heat pack or a heating pad. Place a towel between your skin and the heat source. Leave the heat on for 20-30 minutes. Remove the heat if your skin turns bright red. This is especially important  if you are unable to feel pain, heat, or cold. You have a greater risk of getting burned. General instructions If you were given a sedative during the procedure, it can affect you for several hours. Do not drive or operate machinery until your health care provider says that it is safe. For the first 24 hours after the procedure: Do not sign important documents. Do not drink alcohol. Do your regular daily activities at a slower pace than normal. Eat soft foods that are easy to digest. Take over-the-counter and prescription medicines only as told by your health care provider. Keep all follow-up visits. This is important. Contact a health care provider if: You have blood in your stool 2-3 days after the procedure. Get help right away if: You have more than a small spotting of blood in your stool. You have large blood clots in your stool. You have swelling of your abdomen. You have nausea or vomiting. You have a fever. You have increasing pain in your abdomen that is not relieved with medicine. These symptoms may be an emergency. Get help right away. Call 911. Do not wait to see if the symptoms will go away. Do not drive yourself to the hospital. Summary After the procedure, it is common to have a small amount of blood in your stool. You may also have mild cramping and bloating of your abdomen. If you were given a sedative during the procedure, it can affect you for several hours. Do not drive or operate machinery until your health care provider says that it is safe. Get help right away if you have a lot of blood in your stool, nausea or vomiting, a fever, or increased pain in your abdomen. This information is not intended to replace advice given to you by your health care provider. Make sure you discuss any questions you have with your health care provider. Document Revised: 01/09/2023 Document Reviewed: 07/20/2021 Elsevier Patient Education  2024 [employer] Monitored Anesthesia Care,  Care After The following information offers guidance on how to care for yourself after your procedure. Your health care provider may also give you more specific instructions. If you have problems or questions, contact your health care provider. What can I expect after the procedure? After the procedure, it is common to have: Tiredness. Little or no memory about what happened during or after the procedure. Impaired judgment when it comes to making decisions. Nausea or vomiting. Some trouble with balance. Follow these instructions at home: For the time period you were told by your health care provider:  Rest. Do not participate in activities where you could fall or become injured. Do not drive or use machinery. Do not drink alcohol. Do not take sleeping pills or medicines that cause drowsiness. Do not make important decisions or  sign legal documents. Do not take care of children on your own. Medicines Take over-the-counter and prescription medicines only as told by your health care provider. If you were prescribed antibiotics, take them as told by your health care provider. Do not stop using the antibiotic even if you start to feel better. Eating and drinking Follow instructions from your health care provider about what you may eat and drink. Drink enough fluid to keep your urine pale yellow. If you vomit: Drink clear fluids slowly and in small amounts as you are able. Clear fluids include water, ice chips, low-calorie sports drinks, and fruit juice that has water added to it (diluted fruit juice). Eat light and bland foods in small amounts as you are able. These foods include bananas, applesauce, rice, lean meats, toast, and crackers. General instructions  Have a responsible adult stay with you for the time you are told. It is important to have someone help care for you until you are awake and alert. If you have sleep apnea, surgery and some medicines can increase your risk for  breathing problems. Follow instructions from your health care provider about wearing your sleep device: When you are sleeping. This includes during daytime naps. While taking prescription pain medicines, sleeping medicines, or medicines that make you drowsy. Do not use any products that contain nicotine or tobacco. These products include cigarettes, chewing tobacco, and vaping devices, such as e-cigarettes. If you need help quitting, ask your health care provider. Contact a health care provider if: You feel nauseous or vomit every time you eat or drink. You feel light-headed. You are still sleepy or having trouble with balance after 24 hours. You get a rash. You have a fever. You have redness or swelling around the IV site. Get help right away if: You have trouble breathing. You have new confusion after you get home. These symptoms may be an emergency. Get help right away. Call 911. Do not wait to see if the symptoms will go away. Do not drive yourself to the hospital. This information is not intended to replace advice given to you by your health care provider. Make sure you discuss any questions you have with your health care provider. Document Revised: 04/24/2022 Document Reviewed: 04/24/2022 Elsevier Patient Education  2024 [employer].

## 2023-10-05 ENCOUNTER — Encounter (HOSPITAL_COMMUNITY)
Admission: RE | Admit: 2023-10-05 | Discharge: 2023-10-05 | Disposition: A | Discharge status: Home / Self Care | Payer: Medicare Other | Source: Ambulatory Visit | Attending: Gastroenterology | Admitting: Gastroenterology

## 2023-10-05 VITALS — BP 116/70 | HR 86 | Temp 97.8°F | Resp 18 | Ht 61.0 in | Wt 150.8 lb

## 2023-10-05 DIAGNOSIS — E119 Type 2 diabetes mellitus without complications: Secondary | ICD-10-CM | POA: Diagnosis not present

## 2023-10-05 DIAGNOSIS — Z01818 Encounter for other preprocedural examination: Secondary | ICD-10-CM | POA: Diagnosis not present

## 2023-10-05 DIAGNOSIS — Z87891 Personal history of nicotine dependence: Secondary | ICD-10-CM | POA: Insufficient documentation

## 2023-10-05 LAB — BASIC METABOLIC PANEL
Anion gap: 13 (ref 5–15)
BUN: 14 mg/dL (ref 6–20)
CO2: 24 mmol/L (ref 22–32)
Calcium: 8.7 mg/dL — ABNORMAL LOW (ref 8.9–10.3)
Chloride: 101 mmol/L (ref 98–111)
Creatinine, Ser: 0.81 mg/dL (ref 0.44–1.00)
GFR, Estimated: >60 mL/min (ref >60)
Glucose, Bld: 185 mg/dL — ABNORMAL HIGH (ref 70–99)
Potassium: 3.4 mmol/L — ABNORMAL LOW (ref 3.5–5.1)
Sodium: 138 mmol/L (ref 135–145)

## 2023-10-09 ENCOUNTER — Encounter (HOSPITAL_COMMUNITY)
Admission: RE | Disposition: A | Discharge status: Home / Self Care | Payer: Self-pay | Source: Home / Self Care | Attending: Gastroenterology

## 2023-10-09 ENCOUNTER — Encounter (HOSPITAL_COMMUNITY): Payer: Self-pay | Admitting: Gastroenterology

## 2023-10-09 ENCOUNTER — Ambulatory Visit (HOSPITAL_COMMUNITY)
Admission: RE | Admit: 2023-10-09 | Discharge: 2023-10-09 | Disposition: A | Discharge status: Home / Self Care | Payer: Medicare Other | Attending: Gastroenterology | Admitting: Gastroenterology

## 2023-10-09 ENCOUNTER — Ambulatory Visit (HOSPITAL_COMMUNITY): Payer: Medicare Other | Admitting: Anesthesiology

## 2023-10-09 DIAGNOSIS — R195 Other fecal abnormalities: Secondary | ICD-10-CM | POA: Insufficient documentation

## 2023-10-09 DIAGNOSIS — Z79899 Other long term (current) drug therapy: Secondary | ICD-10-CM | POA: Insufficient documentation

## 2023-10-09 DIAGNOSIS — Z7984 Long term (current) use of oral hypoglycemic drugs: Secondary | ICD-10-CM | POA: Diagnosis not present

## 2023-10-09 DIAGNOSIS — Z7985 Long-term (current) use of injectable non-insulin antidiabetic drugs: Secondary | ICD-10-CM | POA: Insufficient documentation

## 2023-10-09 DIAGNOSIS — Z833 Family history of diabetes mellitus: Secondary | ICD-10-CM | POA: Insufficient documentation

## 2023-10-09 DIAGNOSIS — Z1211 Encounter for screening for malignant neoplasm of colon: Secondary | ICD-10-CM | POA: Diagnosis not present

## 2023-10-09 DIAGNOSIS — F419 Anxiety disorder, unspecified: Secondary | ICD-10-CM | POA: Diagnosis not present

## 2023-10-09 DIAGNOSIS — Z79890 Hormone replacement therapy (postmenopausal): Secondary | ICD-10-CM | POA: Diagnosis not present

## 2023-10-09 DIAGNOSIS — G4730 Sleep apnea, unspecified: Secondary | ICD-10-CM | POA: Insufficient documentation

## 2023-10-09 DIAGNOSIS — J45909 Unspecified asthma, uncomplicated: Secondary | ICD-10-CM | POA: Diagnosis not present

## 2023-10-09 DIAGNOSIS — I10 Essential (primary) hypertension: Secondary | ICD-10-CM | POA: Insufficient documentation

## 2023-10-09 DIAGNOSIS — R519 Headache, unspecified: Secondary | ICD-10-CM | POA: Insufficient documentation

## 2023-10-09 DIAGNOSIS — Z794 Long term (current) use of insulin: Secondary | ICD-10-CM | POA: Insufficient documentation

## 2023-10-09 DIAGNOSIS — K5909 Other constipation: Secondary | ICD-10-CM | POA: Insufficient documentation

## 2023-10-09 DIAGNOSIS — E119 Type 2 diabetes mellitus without complications: Secondary | ICD-10-CM | POA: Diagnosis not present

## 2023-10-09 DIAGNOSIS — J449 Chronic obstructive pulmonary disease, unspecified: Secondary | ICD-10-CM | POA: Insufficient documentation

## 2023-10-09 HISTORY — PX: COLONOSCOPY WITH PROPOFOL: SHX5780

## 2023-10-09 LAB — GLUCOSE, CAPILLARY: Glucose-Capillary: 159 mg/dL — ABNORMAL HIGH (ref 70–99)

## 2023-10-09 SURGERY — COLONOSCOPY WITH PROPOFOL
Anesthesia: General

## 2023-10-09 MED ORDER — PROPOFOL 10 MG/ML IV BOLUS
INTRAVENOUS | Status: DC | PRN
  Administered 2023-10-09: 100 mg via INTRAVENOUS

## 2023-10-09 MED ORDER — PROPOFOL 500 MG/50ML IV EMUL
INTRAVENOUS | Status: DC | PRN
  Administered 2023-10-09: 200 ug/kg/min via INTRAVENOUS

## 2023-10-09 MED ORDER — LACTATED RINGERS IV SOLN: INTRAVENOUS | Status: DC | PRN

## 2023-10-09 NOTE — H&P (Signed)
[name] [name] is an 47 y.o. female.   Chief Complaint: [name] [name] is a 47 y.o. female who presents for evaluation of positive Cologuard and chronic constipation.  HPI: [name] [name] is a 47 y.o. female with past medical history of anxiety, asthma, basal cell carcinoma, degenerative disease, diabetes, fibromyalgia, hyperlipidemia, melanoma, sleep apnea, lymphedema, who presents for evaluation of positive Cologuard.  The patient denies having any nausea, vomiting, fever, chills, hematochezia, melena, hematemesis, abdominal distention, abdominal pain, diarrhea, jaundice, pruritus or weight loss.  Past Medical History:  Diagnosis Date   Actinic keratitis    Allergy    Anxiety    Asthma    Basal cell carcinoma    Chronic venous insufficiency    DDD (degenerative disc disease), lumbar    Diabetes mellitus without complication (HCC)    Fibromyalgia    Fibromyalgia    Hyperlipidemia    Lymphedema    Melanoma (HCC)    Migraine    Mitral valve prolapse    Sleep apnea    Tendonitis of foot    left    Past Surgical History:  Procedure Laterality Date   ABDOMINAL HYSTERECTOMY     CARPAL TUNNEL RELEASE     COLONOSCOPY WITH PROPOFOL N/A 05/09/2023   Procedure: COLONOSCOPY WITH PROPOFOL;  Surgeon: Wyline Mood, MD;  Location: [facility] ENDOSCOPY;  Service: Gastroenterology;  Laterality: N/A;   ESOPHAGOGASTRODUODENOSCOPY (EGD) WITH PROPOFOL N/A 05/09/2023   Procedure: ESOPHAGOGASTRODUODENOSCOPY (EGD) WITH PROPOFOL;  Surgeon: Wyline Mood, MD;  Location: [facility] ENDOSCOPY;  Service: Gastroenterology;  Laterality: N/A;   FOOT TENDON SURGERY Right    KNEE SURGERY     SPINAL FUSION     SPINAL FUSION      Family History  Problem Relation Age of Onset   Cancer Paternal Aunt    Cancer Paternal Uncle    Diabetes Paternal Grandfather    Heart disease Paternal Grandfather    Social History:  reports that she has never smoked. She has been exposed to tobacco smoke. She has never used  smokeless tobacco. She reports that she does not drink alcohol and does not use drugs.  Allergies:  Allergies  Allergen Reactions   Oxycodone Hcl Nausea And Vomiting   Pollen Extract Other (See Comments)    Congestion, sneezing   Topiramate Other (See Comments)    Jerking  Jerking  Jerking  Other reaction(s): Other (See Comments) Jerking     Bee Venom Hives, Itching and Swelling   Buspirone    Crestor [Rosuvastatin] Other (See Comments)    Muscle spasms   Doxycycline Nausea Only   Mirtazapine    Oxycontin [Oxycodone] Nausea Only   Wellbutrin [Bupropion]     Medications Prior to Admission  Medication Sig Dispense Refill   Accu-Chek Softclix Lancets lancets Use 1 lancet to check glucose level once daily 100 each 12   ALPRAZolam (XANAX) 0.5 MG tablet Take 1 tablet (0.5 mg total) by mouth at bedtime as needed for anxiety. 30 tablet 2   atorvastatin (LIPITOR) 10 MG tablet Take 1 tablet (10 mg total) by mouth daily. 30 tablet 5   Blood Glucose Monitoring Suppl (ACCU-CHEK GUIDE ME) w/Device KIT Use as directed. E11.65 1 kit 0   cariprazine (VRAYLAR) 3 MG capsule Take 1 capsule (3 mg total) by mouth daily. 30 capsule 5   Cholecalciferol 25 MCG (1000 UT) tablet Take 1 tablet (1,000 Units total) by mouth once a week. 15 tablet 0   diclofenac Sodium (VOLTAREN) 1 % GEL SMARTSIG:2-4  Gram(s) Topical 1-4 Times Daily PRN     glucose blood (ACCU-CHEK GUIDE) test strip Use 1 test strip to check fasting glucose once daily and as need for diabetes 100 each 12   HYDROmorphone (DILAUDID) 4 MG tablet Take 4 mg by mouth 4 (four) times daily as needed.     ibuprofen (ADVIL) 800 MG tablet Take 1 tablet (800 mg total) by mouth every 8 (eight) hours as needed for moderate pain. 60 tablet 1   levothyroxine (SYNTHROID) 50 MCG tablet Take 1 tablet (50 mcg total) by mouth daily. 90 tablet 1   lubiprostone (AMITIZA) 24 MCG capsule Take 1 capsule (24 mcg total) by mouth 2 (two) times daily with a meal. 60  capsule 3   methocarbamol (ROBAXIN) 500 MG tablet PLEASE SEE ATTACHED FOR DETAILED DIRECTIONS     morphine (MS CONTIN) 60 MG 12 hr tablet Take 60 mg by mouth every 12 (twelve) hours.     Multiple Vitamin (MULTI-VITAMIN) tablet Take 1 tablet by mouth daily.     naloxegol oxalate (MOVANTIK) 25 MG TABS tablet Take 1 tablet (25 mg total) by mouth daily. 30 tablet 2   OVER THE COUNTER MEDICATION Anthony's organic psyllium husk powder. Takes daily     pantoprazole (PROTONIX) 40 MG tablet Take 1 tablet (40 mg total) by mouth daily. 90 tablet 1   pregabalin (LYRICA) 150 MG capsule Take 150 mg by mouth 3 (three) times daily.      traZODone (DESYREL) 50 MG tablet Take 0.5-1 tablets (25-50 mg total) by mouth at bedtime as needed for sleep. 30 tablet 3   Vitamin D, Ergocalciferol, (DRISDOL) 1.25 MG (50000 UNIT) CAPS capsule Take 1 capsule (50,000 Units total) by mouth every 7 (seven) days. 15 capsule 1   albuterol (VENTOLIN HFA) 108 (90 Base) MCG/ACT inhaler Inhale 1-2 puffs into the lungs every 6 (six) hours as needed for wheezing or shortness of breath. 18 g 1   ammonium lactate (LAC-HYDRIN) 12 % lotion Apply 1 application topically as needed for dry skin. 400 g 0   FARXIGA 10 MG TABS tablet TAKE 1 TABLET(10 MG) BY MOUTH DAILY 90 tablet 1   fluticasone (FLONASE) 50 MCG/ACT nasal spray Place 2 sprays into both nostrils daily. 16 g 5   Insulin Pen Needle 31G X 5 MM MISC Use as directed  Once a day Dx e11.65 100 each 3   Ipratropium-Albuterol (COMBIVENT RESPIMAT) 20-100 MCG/ACT AERS respimat INHALE 1 PUFF INTO THE LUNGS EVERY 6 HOURS AS NEEDED FOR WHEEZING 4 g 1   meclizine (ANTIVERT) 25 MG tablet Take 1 tablet (25 mg total) by mouth 3 (three) times daily as needed for dizziness. 90 tablet 2   nystatin (MYCOSTATIN/NYSTOP) powder Apply 1 Application topically 3 (three) times daily. 60 g 5   ondansetron (ZOFRAN-ODT) 4 MG disintegrating tablet Take 1 tablet (4 mg total) by mouth every 8 (eight) hours as needed for  nausea or vomiting. 20 tablet 0   promethazine (PHENERGAN) 25 MG tablet Take 1 tablet (25 mg total) by mouth every 8 (eight) hours as needed for nausea or vomiting. 20 tablet 0   rOPINIRole (REQUIP) 0.25 MG tablet Take 1-2 tabs at night for restless legs 180 tablet 3   Semaglutide, 2 MG/DOSE, (OZEMPIC, 2 MG/DOSE,) 8 MG/3ML SOPN INJECT 2MG  UNDER SKIN WEEKLY 3 mL 2    Results for orders placed or performed during the hospital encounter of 10/09/23 (from the past 48 hour(s))  Glucose, capillary     Status: Abnormal  Collection Time: 10/09/23 10:34 AM  Result Value Ref Range   Glucose-Capillary 159 (H) 70 - 99 mg/dL    Comment: Glucose reference range applies only to samples taken after fasting for at least 8 hours.   No results found.  Review of Systems  All other systems reviewed and are negative.   Blood pressure 134/83, pulse 89, temperature 98.8 F (37.1 C), temperature source Oral, resp. rate 11, height 5\' 1"  (1.549 m), weight 63 kg, last menstrual period 12/13/1991, SpO2 98%. Physical Exam  GENERAL: The patient is AO x3, in no acute distress. HEENT: Head is normocephalic and atraumatic. EOMI are intact. Mouth is well hydrated and without lesions. NECK: Supple. No masses LUNGS: Clear to auscultation. No presence of rhonchi/wheezing/rales. Adequate chest expansion HEART: RRR, normal s1 and s2. ABDOMEN: Soft, nontender, no guarding, no peritoneal signs, and nondistended. BS +. No masses. EXTREMITIES: Without any cyanosis, clubbing, rash, lesions or edema. NEUROLOGIC: AOx3, no focal motor deficit. SKIN: no jaundice, no rashes  Assessment/Plan ANNAMARY LOEBIG is a 47 y.o. female with past medical history of anxiety, asthma, basal cell carcinoma, degenerative disease, diabetes, fibromyalgia, hyperlipidemia, melanoma, sleep apnea, lymphedema, who presents for evaluation of positive Cologuard.  Will proceed with colonoscopy.  Dolores Frame, MD 10/09/2023, 11:24  AM

## 2023-10-09 NOTE — Discharge Instructions (Signed)
You are being discharged to home.  Resume your previous diet.  Your physician has recommended a repeat colonoscopy in 10 years for screening purposes.  

## 2023-10-09 NOTE — Transfer of Care (Signed)
Immediate Anesthesia Transfer of Care Note  Patient: [name] [name]  Procedure(s) Performed: COLONOSCOPY WITH PROPOFOL  Patient Location: Short Stay  Anesthesia Type:General  Level of Consciousness: awake, alert , oriented, and patient cooperative  Airway & Oxygen Therapy: Patient Spontanous Breathing  Post-op Assessment: Report given to RN, Post -op Vital signs reviewed and stable, and Patient moving all extremities X 4  Post vital signs: Reviewed and stable  Last Vitals:  Vitals Value Taken Time  BP    Temp    Pulse    Resp    SpO2      Last Pain:  Vitals:   10/09/23 1058  TempSrc: Oral  PainSc:       Patients Stated Pain Goal: 4 (10/09/23 1043)  Complications: No notable events documented.

## 2023-10-09 NOTE — Anesthesia Preprocedure Evaluation (Signed)
Anesthesia Evaluation  Patient identified by MRN, date of birth, ID band Patient awake    Reviewed: Allergy & Precautions, H&P , NPO status , Patient's Chart, lab work & pertinent test results, reviewed documented beta blocker date and time   Airway Mallampati: II  TM Distance: >3 FB Neck ROM: full    Dental no notable dental hx.    Pulmonary neg pulmonary ROS, asthma , sleep apnea , COPD   Pulmonary exam normal breath sounds clear to auscultation       Cardiovascular Exercise Tolerance: Good hypertension, negative cardio ROS  Rhythm:regular Rate:Normal     Neuro/Psych  Headaches PSYCHIATRIC DISORDERS Anxiety      Neuromuscular disease negative neurological ROS  negative psych ROS   GI/Hepatic negative GI ROS, Neg liver ROS,,,  Endo/Other  negative endocrine ROSdiabetes    Renal/GU negative Renal ROS  negative genitourinary   Musculoskeletal   Abdominal   Peds  Hematology negative hematology ROS (+)   Anesthesia Other Findings   Reproductive/Obstetrics negative OB ROS                             Anesthesia Physical Anesthesia Plan  ASA: 2  Anesthesia Plan: General   Post-op Pain Management:    Induction:   PONV Risk Score and Plan: Propofol infusion  Airway Management Planned:   Additional Equipment:   Intra-op Plan:   Post-operative Plan:   Informed Consent: I have reviewed the patients History and Physical, chart, labs and discussed the procedure including the risks, benefits and alternatives for the proposed anesthesia with the patient or authorized representative who has indicated his/her understanding and acceptance.     Dental Advisory Given  Plan Discussed with: CRNA  Anesthesia Plan Comments:        Anesthesia Quick Evaluation

## 2023-10-09 NOTE — Op Note (Addendum)
[facility] Patient Name: [name] [name] Procedure Date: 10/09/2023 2:03 PM MRN: [id number] Date of Birth: 1976-03-04 Attending MD: [name] , , [id number] CSN: [id number] Age: 47 Admit Type: Outpatient Procedure:                Colonoscopy Indications:              Positive Cologuard test Providers:                [name], Francoise Ceo RN, RN, Nena Polio,                            RN, Pandora Leiter, Technician Referring MD:              Medicines:                Monitored Anesthesia Care Complications:            No immediate complications. Estimated Blood Loss:     Estimated blood loss: none. Procedure:                Pre-Anesthesia Assessment:                           - Prior to the procedure, a History and Physical                            was performed, and patient medications, allergies                            and sensitivities were reviewed. The patient's                            tolerance of previous anesthesia was reviewed.                           - The risks and benefits of the procedure and the                            sedation options and risks were discussed with the                            patient. All questions were answered and informed                            consent was obtained.                           - ASA Grade Assessment: II - A patient with mild                            systemic disease.                           After obtaining informed consent, the colonoscope                            was passed under direct vision. Throughout the  procedure, the patient's blood pressure, pulse, and                            oxygen saturations were monitored continuously. The                            [id number]-[id number] ([id number]) scope was introduced through                            the anus and advanced to the the cecum, identified                            by appendiceal orifice and ileocecal valve.  The                            colonoscopy was performed without difficulty. The                            patient tolerated the procedure well. The quality                            of the bowel preparation was excellent. Scope In: 2:15:13 PM Scope Out: 2:40:40 PM Scope Withdrawal Time: 0 hours 18 minutes 15 seconds  Total Procedure Duration: 0 hours 25 minutes 27 seconds  Findings:      The perianal and digital rectal examinations were normal.      The entire examined colon appeared normal on direct and retroflexion       views. Impression:               - The entire examined colon is normal on direct and                            retroflexion views.                           - No specimens collected. Moderate Sedation:      Per Anesthesia Care Recommendation:           - Discharge patient to home (ambulatory).                           - Resume previous diet.                           - Repeat colonoscopy in 10 years for screening                            purposes. Procedure Code(s):        --- Professional ---                           [phone], Colonoscopy, flexible; diagnostic, including                            collection of specimen(s) by brushing or washing,  when performed (separate procedure) Diagnosis Code(s):        --- Professional ---                           R19.5, Other fecal abnormalities CPT copyright 2022 American Medical Association. All rights reserved. The codes documented in this report are preliminary and upon coder review may  be revised to meet current compliance requirements. [name], MD [name],  10/09/2023 2:47:07 PM This report has been signed electronically. Number of Addenda: 0

## 2023-10-10 ENCOUNTER — Other Ambulatory Visit: Payer: Self-pay

## 2023-10-10 DIAGNOSIS — Z760 Encounter for issue of repeat prescription: Secondary | ICD-10-CM

## 2023-10-10 MED ORDER — DAPAGLIFLOZIN PROPANEDIOL 10 MG PO TABS: ORAL_TABLET | ORAL | 1 refills | Status: DC

## 2023-10-13 ENCOUNTER — Other Ambulatory Visit: Payer: Self-pay | Admitting: Nurse Practitioner

## 2023-10-13 DIAGNOSIS — Z760 Encounter for issue of repeat prescription: Secondary | ICD-10-CM

## 2023-10-15 ENCOUNTER — Encounter (HOSPITAL_COMMUNITY): Payer: Self-pay | Admitting: Gastroenterology

## 2023-10-15 NOTE — Anesthesia Postprocedure Evaluation (Signed)
Anesthesia Post Note  Patient: [name] [name]  Procedure(s) Performed: COLONOSCOPY WITH PROPOFOL  Patient location during evaluation: Phase II Anesthesia Type: General Level of consciousness: awake Pain management: pain level controlled Vital Signs Assessment: post-procedure vital signs reviewed and stable Respiratory status: spontaneous breathing and respiratory function stable Cardiovascular status: blood pressure returned to baseline and stable Postop Assessment: no headache and no apparent nausea or vomiting Anesthetic complications: no Comments: Late entry   No notable events documented.   Last Vitals:  Vitals:   10/09/23 1058 10/09/23 1445  BP: 134/83 (!) 91/54  Pulse: 89 94  Resp: 11 16  Temp: 37.1 C 36.9 C  SpO2: 98% 97%    Last Pain:  Vitals:   10/09/23 1445  TempSrc: Oral  PainSc: 0-No pain                 Windell Norfolk

## 2023-10-23 DIAGNOSIS — G8929 Other chronic pain: Secondary | ICD-10-CM | POA: Diagnosis not present

## 2023-10-23 DIAGNOSIS — M62838 Other muscle spasm: Secondary | ICD-10-CM | POA: Diagnosis not present

## 2023-10-23 DIAGNOSIS — M545 Low back pain, unspecified: Secondary | ICD-10-CM | POA: Diagnosis not present

## 2023-10-23 DIAGNOSIS — M5431 Sciatica, right side: Secondary | ICD-10-CM | POA: Diagnosis not present

## 2023-10-23 DIAGNOSIS — Z79899 Other long term (current) drug therapy: Secondary | ICD-10-CM | POA: Diagnosis not present

## 2023-10-23 DIAGNOSIS — M797 Fibromyalgia: Secondary | ICD-10-CM | POA: Diagnosis not present

## 2023-10-23 DIAGNOSIS — M25562 Pain in left knee: Secondary | ICD-10-CM | POA: Diagnosis not present

## 2023-10-30 ENCOUNTER — Ambulatory Visit: Payer: Medicare Other | Admitting: Nurse Practitioner

## 2023-11-12 ENCOUNTER — Ambulatory Visit (INDEPENDENT_AMBULATORY_CARE_PROVIDER_SITE_OTHER): Payer: Medicare Other | Admitting: Gastroenterology

## 2023-11-13 ENCOUNTER — Other Ambulatory Visit: Payer: Self-pay | Admitting: Nurse Practitioner

## 2023-11-13 DIAGNOSIS — Z79899 Other long term (current) drug therapy: Secondary | ICD-10-CM

## 2023-11-15 ENCOUNTER — Encounter (INDEPENDENT_AMBULATORY_CARE_PROVIDER_SITE_OTHER): Payer: Self-pay | Admitting: Gastroenterology

## 2023-11-20 DIAGNOSIS — M797 Fibromyalgia: Secondary | ICD-10-CM | POA: Diagnosis not present

## 2023-11-20 DIAGNOSIS — Z79899 Other long term (current) drug therapy: Secondary | ICD-10-CM | POA: Diagnosis not present

## 2023-11-20 DIAGNOSIS — G8929 Other chronic pain: Secondary | ICD-10-CM | POA: Diagnosis not present

## 2023-11-20 DIAGNOSIS — M5431 Sciatica, right side: Secondary | ICD-10-CM | POA: Diagnosis not present

## 2023-11-20 DIAGNOSIS — M25562 Pain in left knee: Secondary | ICD-10-CM | POA: Diagnosis not present

## 2023-11-20 DIAGNOSIS — M545 Low back pain, unspecified: Secondary | ICD-10-CM | POA: Diagnosis not present

## 2023-11-20 DIAGNOSIS — M62838 Other muscle spasm: Secondary | ICD-10-CM | POA: Diagnosis not present

## 2023-11-28 ENCOUNTER — Ambulatory Visit (INDEPENDENT_AMBULATORY_CARE_PROVIDER_SITE_OTHER): Payer: Medicare Other | Admitting: Nurse Practitioner

## 2023-11-28 ENCOUNTER — Encounter: Payer: Self-pay | Admitting: Nurse Practitioner

## 2023-11-28 VITALS — BP 130/70 | HR 90 | Temp 98.5°F | Resp 16 | Ht 61.0 in | Wt 146.2 lb

## 2023-11-28 DIAGNOSIS — E1169 Type 2 diabetes mellitus with other specified complication: Secondary | ICD-10-CM

## 2023-11-28 DIAGNOSIS — F411 Generalized anxiety disorder: Secondary | ICD-10-CM | POA: Diagnosis not present

## 2023-11-28 DIAGNOSIS — E785 Hyperlipidemia, unspecified: Secondary | ICD-10-CM

## 2023-11-28 DIAGNOSIS — Z79899 Other long term (current) drug therapy: Secondary | ICD-10-CM | POA: Diagnosis not present

## 2023-11-28 MED ORDER — OZEMPIC (2 MG/DOSE) 8 MG/3ML ~~LOC~~ SOPN: PEN_INJECTOR | SUBCUTANEOUS | 2 refills | Status: DC

## 2023-11-28 MED ORDER — ALBUTEROL SULFATE HFA 108 (90 BASE) MCG/ACT IN AERS: 1.0000 | INHALATION_SPRAY | Freq: Four times a day (QID) | RESPIRATORY_TRACT | 1 refills | Status: DC | PRN

## 2023-11-28 MED ORDER — ONDANSETRON 4 MG PO TBDP: 4.0000 mg | ORAL_TABLET | Freq: Three times a day (TID) | ORAL | 0 refills | Status: DC | PRN

## 2023-11-28 MED ORDER — ALPRAZOLAM 0.5 MG PO TABS: 0.5000 mg | ORAL_TABLET | Freq: Every evening | ORAL | 2 refills | Status: DC | PRN

## 2023-11-28 MED ORDER — TRAZODONE HCL 50 MG PO TABS: 25.0000 mg | ORAL_TABLET | Freq: Every evening | ORAL | 3 refills | Status: DC | PRN

## 2023-11-28 MED ORDER — PROMETHAZINE HCL 25 MG PO TABS: 25.0000 mg | ORAL_TABLET | Freq: Three times a day (TID) | ORAL | 0 refills | Status: DC | PRN

## 2023-11-28 MED ORDER — IBUPROFEN 800 MG PO TABS: 800.0000 mg | ORAL_TABLET | Freq: Three times a day (TID) | ORAL | 1 refills | Status: DC | PRN

## 2023-11-28 MED ORDER — ATORVASTATIN CALCIUM 10 MG PO TABS: 10.0000 mg | ORAL_TABLET | Freq: Every day | ORAL | 5 refills | Status: DC

## 2023-11-28 NOTE — Progress Notes (Signed)
[facility] [address] [place], [place] [address]  Internal MEDICINE  Office Visit Note  Patient Name: [name] [name]  [id number]  [id number]  Date of Service: 11/28/2023  Chief Complaint  Patient presents with   Diabetes   Hyperlipidemia   Follow-up    HPI [name] [name] presents for a follow-up visit for diabetes, anxiety, sore throat.  Diabetes -- glucose readings have been stable per patient. Anxiety -- due for alprazolam refills  Developing possible viral URI. Discussed over the counter interventions and symptom management. Sore throat started last night Due for multiple refills Due for urine microalbumin/creatinine ratio lab but until to urinate today.    Current Medication: Outpatient Encounter Medications as of 11/28/2023  Medication Sig   Accu-Chek Softclix Lancets lancets Use 1 lancet to check glucose level once daily   ammonium lactate (LAC-HYDRIN) 12 % lotion Apply 1 application topically as needed for dry skin.   Blood Glucose Monitoring Suppl (ACCU-CHEK GUIDE ME) w/Device KIT Use as directed. E11.65   cariprazine (VRAYLAR) 3 MG capsule Take 1 capsule (3 mg total) by mouth daily.   Cholecalciferol 25 MCG (1000 UT) tablet Take 1 tablet (1,000 Units total) by mouth once a week.   COMBIVENT RESPIMAT 20-100 MCG/ACT AERS respimat INHALE 1 PUFF INTO THE LUNGS EVERY 6 HOURS AS NEEDED FOR WHEEZING   dapagliflozin propanediol (FARXIGA) 10 MG TABS tablet Take 1 tab po daily   diclofenac Sodium (VOLTAREN) 1 % GEL SMARTSIG:2-4 Gram(s) Topical 1-4 Times Daily PRN   fluticasone (FLONASE) 50 MCG/ACT nasal spray Place 2 sprays into both nostrils daily.   glucose blood (ACCU-CHEK GUIDE) test strip Use 1 test strip to check fasting glucose once daily and as need for diabetes   HYDROmorphone (DILAUDID) 4 MG tablet Take 4 mg by mouth 4 (four) times daily as needed.   Insulin Pen Needle 31G X 5 MM MISC Use as directed  Once a day Dx e11.65   levothyroxine (SYNTHROID) 50  MCG tablet TAKE 1 TABLET(50 MCG) BY MOUTH DAILY   lubiprostone (AMITIZA) 24 MCG capsule Take 1 capsule (24 mcg total) by mouth 2 (two) times daily with a meal.   meclizine (ANTIVERT) 25 MG tablet Take 1 tablet (25 mg total) by mouth 3 (three) times daily as needed for dizziness.   methocarbamol (ROBAXIN) 500 MG tablet PLEASE SEE ATTACHED FOR DETAILED DIRECTIONS   morphine (MS CONTIN) 60 MG 12 hr tablet Take 60 mg by mouth every 12 (twelve) hours.   Multiple Vitamin (MULTI-VITAMIN) tablet Take 1 tablet by mouth daily.   nystatin (MYCOSTATIN/NYSTOP) powder Apply 1 Application topically 3 (three) times daily.   OVER THE COUNTER MEDICATION Anthony's organic psyllium husk powder. Takes daily   pantoprazole (PROTONIX) 40 MG tablet Take 1 tablet (40 mg total) by mouth daily.   pregabalin (LYRICA) 150 MG capsule Take 150 mg by mouth 3 (three) times daily.    rOPINIRole (REQUIP) 0.25 MG tablet Take 1-2 tabs at night for restless legs   Vitamin D, Ergocalciferol, (DRISDOL) 1.25 MG (50000 UNIT) CAPS capsule Take 1 capsule (50,000 Units total) by mouth every 7 (seven) days.   [DISCONTINUED] albuterol (VENTOLIN HFA) 108 (90 Base) MCG/ACT inhaler Inhale 1-2 puffs into the lungs every 6 (six) hours as needed for wheezing or shortness of breath.   [DISCONTINUED] ALPRAZolam (XANAX) 0.5 MG tablet Take 1 tablet (0.5 mg total) by mouth at bedtime as needed for anxiety.   [DISCONTINUED] atorvastatin (LIPITOR) 10 MG tablet Take 1 tablet (10 mg total)  by mouth daily.   [DISCONTINUED] ibuprofen (ADVIL) 800 MG tablet Take 1 tablet (800 mg total) by mouth every 8 (eight) hours as needed for moderate pain.   [DISCONTINUED] naloxegol oxalate (MOVANTIK) 25 MG TABS tablet Take 1 tablet (25 mg total) by mouth daily.   [DISCONTINUED] ondansetron (ZOFRAN-ODT) 4 MG disintegrating tablet Take 1 tablet (4 mg total) by mouth every 8 (eight) hours as needed for nausea or vomiting.   [DISCONTINUED] promethazine (PHENERGAN) 25 MG tablet  Take 1 tablet (25 mg total) by mouth every 8 (eight) hours as needed for nausea or vomiting.   [DISCONTINUED] Semaglutide, 2 MG/DOSE, (OZEMPIC, 2 MG/DOSE,) 8 MG/3ML SOPN INJECT 2MG  UNDER SKIN WEEKLY   [DISCONTINUED] traZODone (DESYREL) 50 MG tablet Take 0.5-1 tablets (25-50 mg total) by mouth at bedtime as needed for sleep.   albuterol (VENTOLIN HFA) 108 (90 Base) MCG/ACT inhaler Inhale 1-2 puffs into the lungs every 6 (six) hours as needed for wheezing or shortness of breath.   ALPRAZolam (XANAX) 0.5 MG tablet Take 1 tablet (0.5 mg total) by mouth at bedtime as needed for anxiety.   atorvastatin (LIPITOR) 10 MG tablet Take 1 tablet (10 mg total) by mouth daily.   ibuprofen (ADVIL) 800 MG tablet Take 1 tablet (800 mg total) by mouth every 8 (eight) hours as needed for moderate pain (pain score 4-6).   ondansetron (ZOFRAN-ODT) 4 MG disintegrating tablet Take 1 tablet (4 mg total) by mouth every 8 (eight) hours as needed for nausea or vomiting.   promethazine (PHENERGAN) 25 MG tablet Take 1 tablet (25 mg total) by mouth every 8 (eight) hours as needed for nausea or vomiting.   Semaglutide, 2 MG/DOSE, (OZEMPIC, 2 MG/DOSE,) 8 MG/3ML SOPN INJECT 2MG  UNDER SKIN WEEKLY   traZODone (DESYREL) 50 MG tablet Take 0.5-1 tablets (25-50 mg total) by mouth at bedtime as needed for sleep.   No facility-administered encounter medications on file as of 11/28/2023.    Surgical History: Past Surgical History:  Procedure Laterality Date   ABDOMINAL HYSTERECTOMY     CARPAL TUNNEL RELEASE     COLONOSCOPY WITH PROPOFOL N/A 05/09/2023   Procedure: COLONOSCOPY WITH PROPOFOL;  Surgeon: Wyline Mood, MD;  Location: [facility] ENDOSCOPY;  Service: Gastroenterology;  Laterality: N/A;   COLONOSCOPY WITH PROPOFOL N/A 10/09/2023   Procedure: COLONOSCOPY WITH PROPOFOL;  Surgeon: Dolores Frame, MD;  Location: [facility] ENDO SUITE;  Service: Gastroenterology;  Laterality: N/A;  1:00PM;ASA 3, bumpt up to 12:45 per Mindy    ESOPHAGOGASTRODUODENOSCOPY (EGD) WITH PROPOFOL N/A 05/09/2023   Procedure: ESOPHAGOGASTRODUODENOSCOPY (EGD) WITH PROPOFOL;  Surgeon: Wyline Mood, MD;  Location: [facility] ENDOSCOPY;  Service: Gastroenterology;  Laterality: N/A;   FOOT TENDON SURGERY Right    KNEE SURGERY     SPINAL FUSION     SPINAL FUSION      Medical History: Past Medical History:  Diagnosis Date   Actinic keratitis    Allergy    Anxiety    Asthma    Basal cell carcinoma    Chronic venous insufficiency    DDD (degenerative disc disease), lumbar    Diabetes mellitus without complication (HCC)    Fibromyalgia    Fibromyalgia    Hyperlipidemia    Lymphedema    Melanoma (HCC)    Migraine    Mitral valve prolapse    Sleep apnea    Tendonitis of foot    left    Family History: Family History  Problem Relation Age of Onset   Cancer Paternal Aunt  Cancer Paternal Uncle    Diabetes Paternal Grandfather    Heart disease Paternal Grandfather     Social History   Socioeconomic History   Marital status: Divorced    Spouse name: Not on file   Number of children: Not on file   Years of education: Not on file   Highest education level: Not on file  Occupational History   Not on file  Tobacco Use   Smoking status: Never    Passive exposure: Past   Smokeless tobacco: Never  Vaping Use   Vaping status: Never Used  Substance and Sexual Activity   Alcohol use: No   Drug use: No   Sexual activity: Not on file  Other Topics Concern   Not on file  Social History Narrative   Not on file   Social Drivers of Health   Financial Resource Strain: Not on file  Food Insecurity: No Food Insecurity (04/07/2022)   Received from [facility], [facility]   Hunger Vital Sign    Worried About Running Out of Food in the Last Year: Never true    Ran Out of Food in the Last Year: Never true  Transportation Needs: Not on file  Physical Activity: Not on file  Stress: Not on file  Social Connections: Unknown  (04/09/2022)   Received from [facility], [facility]   Social Network    Social Network: Not on file  Intimate Partner Violence: Unknown (03/13/2022)   Received from [facility], [facility]   HITS    Physically Hurt: Not on file    Insult or Talk Down To: Not on file    Threaten Physical Harm: Not on file    Scream or Curse: Not on file      Review of Systems  Constitutional:  Positive for fatigue. Negative for chills and unexpected weight change.  HENT:  Negative for congestion, postnasal drip, rhinorrhea, sneezing and sore throat.   Eyes:  Negative for redness.  Respiratory: Negative.  Negative for cough, chest tightness, shortness of breath and wheezing.   Cardiovascular: Negative.  Negative for chest pain and palpitations.  Gastrointestinal:  Positive for constipation and nausea. Negative for abdominal pain, diarrhea and vomiting.  Genitourinary:  Negative for dysuria and frequency.  Musculoskeletal:  Positive for arthralgias and back pain. Negative for joint swelling and neck pain.  Skin:  Negative for rash.  Neurological:  Negative for tremors and numbness.  Hematological:  Negative for adenopathy. Does not bruise/bleed easily.  Psychiatric/Behavioral:  Positive for behavioral problems (Depression), dysphoric mood and sleep disturbance. Negative for self-injury and suicidal ideas. The patient is nervous/anxious.     Vital Signs: BP 130/70   Pulse 90   Temp 98.5 F (36.9 C)   Resp 16   Ht 5\' 1"  (1.549 m)   Wt 146 lb 3.2 oz (66.3 kg)   LMP 12/13/1991   SpO2 95%   BMI 27.62 kg/m    Physical Exam Vitals reviewed.  Constitutional:      Appearance: Normal appearance. She is obese.  HENT:     Head: Normocephalic and atraumatic.  Eyes:     Pupils: Pupils are equal, round, and reactive to light.  Cardiovascular:     Rate and Rhythm: Normal rate and regular rhythm.  Pulmonary:     Effort: Pulmonary effort is normal. No respiratory distress.  Neurological:      Mental Status: She is alert and oriented to person, place, and time.  Psychiatric:  Mood and Affect: Mood normal.        Behavior: Behavior normal.        Assessment/Plan: 1. Type 2 diabetes mellitus with other specified complication, without long-term current use of insulin (HCC) (Primary) Urine lab for microalbumin/creatinine ration ordered to be done at [facility] anytime. Ozempic refills ordered, continue as prescribed.  - Urine Microalbumin w/creat. ratio - Semaglutide, 2 MG/DOSE, (OZEMPIC, 2 MG/DOSE,) 8 MG/3ML SOPN; INJECT 2MG  UNDER SKIN WEEKLY  Dispense: 3 mL; Refill: 2  2. Hyperlipidemia associated with type 2 diabetes mellitus (HCC) Continue atorvastatin as prescribed.  - atorvastatin (LIPITOR) 10 MG tablet; Take 1 tablet (10 mg total) by mouth daily.  Dispense: 30 tablet; Refill: 5  3. Encounter for medication review Medication list reviewed, updated and refills ordered - albuterol (VENTOLIN HFA) 108 (90 Base) MCG/ACT inhaler; Inhale 1-2 puffs into the lungs every 6 (six) hours as needed for wheezing or shortness of breath.  Dispense: 18 g; Refill: 1 - ibuprofen (ADVIL) 800 MG tablet; Take 1 tablet (800 mg total) by mouth every 8 (eight) hours as needed for moderate pain (pain score 4-6).  Dispense: 60 tablet; Refill: 1 - ondansetron (ZOFRAN-ODT) 4 MG disintegrating tablet; Take 1 tablet (4 mg total) by mouth every 8 (eight) hours as needed for nausea or vomiting.  Dispense: 20 tablet; Refill: 0 - promethazine (PHENERGAN) 25 MG tablet; Take 1 tablet (25 mg total) by mouth every 8 (eight) hours as needed for nausea or vomiting.  Dispense: 20 tablet; Refill: 0 - traZODone (DESYREL) 50 MG tablet; Take 0.5-1 tablets (25-50 mg total) by mouth at bedtime as needed for sleep.  Dispense: 30 tablet; Refill: 3  4. GAD (generalized anxiety disorder) Patient will bring a copy of her urine drug screen from her pain management doctor's office. Continue alprazolam as prescribed. Follow  up in 3 months for annual visit and refills.  - ALPRAZolam (XANAX) 0.5 MG tablet; Take 1 tablet (0.5 mg total) by mouth at bedtime as needed for anxiety.  Dispense: 30 tablet; Refill: 2   General Counseling: [name] [name] verbalizes understanding of the findings of todays visit and agrees with plan of treatment. I have discussed any further diagnostic evaluation that may be needed or ordered today. We also reviewed her medications today. she has been encouraged to call the office with any questions or concerns that should arise related to todays visit.    Orders Placed This Encounter  Procedures   Urine Microalbumin w/creat. ratio    Meds ordered this encounter  Medications   ALPRAZolam (XANAX) 0.5 MG tablet    Sig: Take 1 tablet (0.5 mg total) by mouth at bedtime as needed for anxiety.    Dispense:  30 tablet    Refill:  2   albuterol (VENTOLIN HFA) 108 (90 Base) MCG/ACT inhaler    Sig: Inhale 1-2 puffs into the lungs every 6 (six) hours as needed for wheezing or shortness of breath.    Dispense:  18 g    Refill:  1    For future refills, keep on file   atorvastatin (LIPITOR) 10 MG tablet    Sig: Take 1 tablet (10 mg total) by mouth daily.    Dispense:  30 tablet    Refill:  5    For future refills, keep on file   ibuprofen (ADVIL) 800 MG tablet    Sig: Take 1 tablet (800 mg total) by mouth every 8 (eight) hours as needed for moderate pain (pain score 4-6).  Dispense:  60 tablet    Refill:  1    For future refills, keep on file   ondansetron (ZOFRAN-ODT) 4 MG disintegrating tablet    Sig: Take 1 tablet (4 mg total) by mouth every 8 (eight) hours as needed for nausea or vomiting.    Dispense:  20 tablet    Refill:  0    For future refills, keep on file   promethazine (PHENERGAN) 25 MG tablet    Sig: Take 1 tablet (25 mg total) by mouth every 8 (eight) hours as needed for nausea or vomiting.    Dispense:  20 tablet    Refill:  0    For future refills, keep on file    Semaglutide, 2 MG/DOSE, (OZEMPIC, 2 MG/DOSE,) 8 MG/3ML SOPN    Sig: INJECT 2MG  UNDER SKIN WEEKLY    Dispense:  3 mL    Refill:  2    For future refills, keep on file   traZODone (DESYREL) 50 MG tablet    Sig: Take 0.5-1 tablets (25-50 mg total) by mouth at bedtime as needed for sleep.    Dispense:  30 tablet    Refill:  3    For future refills, keep on file    Return for previously scheduled, AWV, Fleur Audino PCP in march and will do alprazolam refills then, bring UDS result.   Total time spent:30 Minutes Time spent includes review of chart, medications, test results, and follow up plan with the patient.   [place] Controlled Substance Database was reviewed by me.  This patient was seen by Sallyanne Kuster, FNP-C in collaboration with Dr. Beverely Risen as a part of collaborative care agreement.   Samaiyah Howes R. Tedd Sias, MSN, FNP-C Internal medicine

## 2023-12-11 ENCOUNTER — Ambulatory Visit (INDEPENDENT_AMBULATORY_CARE_PROVIDER_SITE_OTHER): Payer: Medicare Other | Admitting: Gastroenterology

## 2023-12-15 ENCOUNTER — Encounter: Payer: Self-pay | Admitting: Nurse Practitioner

## 2023-12-18 DIAGNOSIS — M5431 Sciatica, right side: Secondary | ICD-10-CM | POA: Diagnosis not present

## 2023-12-18 DIAGNOSIS — M797 Fibromyalgia: Secondary | ICD-10-CM | POA: Diagnosis not present

## 2023-12-18 DIAGNOSIS — M62838 Other muscle spasm: Secondary | ICD-10-CM | POA: Diagnosis not present

## 2023-12-18 DIAGNOSIS — M545 Low back pain, unspecified: Secondary | ICD-10-CM | POA: Diagnosis not present

## 2023-12-18 DIAGNOSIS — G8929 Other chronic pain: Secondary | ICD-10-CM | POA: Diagnosis not present

## 2023-12-18 DIAGNOSIS — M25562 Pain in left knee: Secondary | ICD-10-CM | POA: Diagnosis not present

## 2023-12-24 ENCOUNTER — Telehealth: Payer: Self-pay | Admitting: Nurse Practitioner

## 2023-12-24 DIAGNOSIS — E1169 Type 2 diabetes mellitus with other specified complication: Secondary | ICD-10-CM | POA: Diagnosis not present

## 2023-12-24 NOTE — Telephone Encounter (Signed)
 Appt request for tomorrow wrist hurting forward to [facility]

## 2023-12-25 ENCOUNTER — Ambulatory Visit (INDEPENDENT_AMBULATORY_CARE_PROVIDER_SITE_OTHER): Payer: Medicare Other | Admitting: Nurse Practitioner

## 2023-12-25 ENCOUNTER — Encounter: Payer: Self-pay | Admitting: Nurse Practitioner

## 2023-12-25 VITALS — BP 108/70 | HR 97 | Temp 98.3°F | Resp 16 | Ht 61.0 in | Wt 149.0 lb

## 2023-12-25 DIAGNOSIS — M25532 Pain in left wrist: Secondary | ICD-10-CM

## 2023-12-25 DIAGNOSIS — D229 Melanocytic nevi, unspecified: Secondary | ICD-10-CM

## 2023-12-25 DIAGNOSIS — M67432 Ganglion, left wrist: Secondary | ICD-10-CM

## 2023-12-25 LAB — MICROALBUMIN / CREATININE URINE RATIO
Creatinine, Urine: 70.4 mg/dL
Microalb/Creat Ratio: <4 mg/g{creat} (ref 0–29)
Microalbumin, Urine: <3 ug/mL

## 2023-12-25 MED ORDER — PREDNISONE 10 MG (21) PO TBPK: ORAL_TABLET | ORAL | 0 refills | Status: DC

## 2023-12-25 NOTE — Telephone Encounter (Signed)
 Pt had appt today.

## 2023-12-25 NOTE — Progress Notes (Signed)
 [facility] [address] [place], [place] [address]  Internal MEDICINE  Office Visit Note  Patient Name: [name] [name]  [id number]  [id number]  Date of Service: 12/25/2023  Chief Complaint  Patient presents with   Acute Visit    Left wrist pain     HPI [name] [name] presents for an acute sick visit for acute left wrist pain and multiple skin moles.  --onset of left wrist pain was last week, and a lump on the volar aspect of the left wrist was noted then as well.  Some swelling noted. No bruising. Pain radiates up the arm and ROM is decreased, and decreased grip strength also noted.  Also has multiple skin moles that she would like to have dermatology examine. Requesting referral today.     Current Medication:  Outpatient Encounter Medications as of 12/25/2023  Medication Sig   Accu-Chek Softclix Lancets lancets Use 1 lancet to check glucose level once daily   albuterol  (VENTOLIN  HFA) 108 (90 Base) MCG/ACT inhaler Inhale 1-2 puffs into the lungs every 6 (six) hours as needed for wheezing or shortness of breath.   ALPRAZolam  (XANAX ) 0.5 MG tablet Take 1 tablet (0.5 mg total) by mouth at bedtime as needed for anxiety.   ammonium lactate  (LAC-HYDRIN ) 12 % lotion Apply 1 application topically as needed for dry skin.   atorvastatin  (LIPITOR) 10 MG tablet Take 1 tablet (10 mg total) by mouth daily.   Blood Glucose Monitoring Suppl (ACCU-CHEK GUIDE ME) w/Device KIT Use as directed. E11.65   cariprazine  (VRAYLAR ) 3 MG capsule Take 1 capsule (3 mg total) by mouth daily.   Cholecalciferol  25 MCG (1000 UT) tablet Take 1 tablet (1,000 Units total) by mouth once a week.   COMBIVENT  RESPIMAT 20-100 MCG/ACT AERS respimat INHALE 1 PUFF INTO THE LUNGS EVERY 6 HOURS AS NEEDED FOR WHEEZING   dapagliflozin  propanediol (FARXIGA ) 10 MG TABS tablet Take 1 tab po daily   diclofenac  Sodium (VOLTAREN ) 1 % GEL SMARTSIG:2-4 Gram(s) Topical 1-4 Times Daily PRN   fluticasone  (FLONASE ) 50 MCG/ACT  nasal spray Place 2 sprays into both nostrils daily.   glucose blood (ACCU-CHEK GUIDE) test strip Use 1 test strip to check fasting glucose once daily and as need for diabetes   HYDROmorphone  (DILAUDID ) 4 MG tablet Take 4 mg by mouth 4 (four) times daily as needed.   ibuprofen  (ADVIL ) 800 MG tablet Take 1 tablet (800 mg total) by mouth every 8 (eight) hours as needed for moderate pain (pain score 4-6).   Insulin  Pen Needle 31G X 5 MM MISC Use as directed  Once a day Dx e11.65   levothyroxine  (SYNTHROID ) 50 MCG tablet TAKE 1 TABLET(50 MCG) BY MOUTH DAILY   lubiprostone  (AMITIZA ) 24 MCG capsule Take 1 capsule (24 mcg total) by mouth 2 (two) times daily with a meal.   meclizine  (ANTIVERT ) 25 MG tablet Take 1 tablet (25 mg total) by mouth 3 (three) times daily as needed for dizziness.   methocarbamol  (ROBAXIN ) 500 MG tablet PLEASE SEE ATTACHED FOR DETAILED DIRECTIONS   morphine  (MS CONTIN ) 60 MG 12 hr tablet Take 60 mg by mouth every 12 (twelve) hours.   Multiple Vitamin (MULTI-VITAMIN) tablet Take 1 tablet by mouth daily.   nystatin  (MYCOSTATIN /NYSTOP ) powder Apply 1 Application topically 3 (three) times daily.   ondansetron  (ZOFRAN -ODT) 4 MG disintegrating tablet Take 1 tablet (4 mg total) by mouth every 8 (eight) hours as needed for nausea or vomiting.   OVER THE COUNTER MEDICATION Anthony's organic psyllium  husk powder. Takes daily   pantoprazole  (PROTONIX ) 40 MG tablet Take 1 tablet (40 mg total) by mouth daily.   predniSONE  (STERAPRED UNI-PAK 21 TAB) 10 MG (21) TBPK tablet Use as directed for 6 days   pregabalin  (LYRICA ) 150 MG capsule Take 150 mg by mouth 3 (three) times daily.    promethazine  (PHENERGAN ) 25 MG tablet Take 1 tablet (25 mg total) by mouth every 8 (eight) hours as needed for nausea or vomiting.   rOPINIRole  (REQUIP ) 0.25 MG tablet Take 1-2 tabs at night for restless legs   Semaglutide , 2 MG/DOSE, (OZEMPIC , 2 MG/DOSE,) 8 MG/3ML SOPN INJECT 2MG  UNDER SKIN WEEKLY   traZODone   (DESYREL ) 50 MG tablet Take 0.5-1 tablets (25-50 mg total) by mouth at bedtime as needed for sleep.   Vitamin D , Ergocalciferol , (DRISDOL ) 1.25 MG (50000 UNIT) CAPS capsule Take 1 capsule (50,000 Units total) by mouth every 7 (seven) days.   No facility-administered encounter medications on file as of 12/25/2023.      Medical History: Past Medical History:  Diagnosis Date   Actinic keratitis    Allergy    Anxiety    Asthma    Basal cell carcinoma    Chronic venous insufficiency    DDD (degenerative disc disease), lumbar    Diabetes mellitus without complication (HCC)    Fibromyalgia    Fibromyalgia    Hyperlipidemia    Lymphedema    Melanoma (HCC)    Migraine    Mitral valve prolapse    Sleep apnea    Tendonitis of foot    left     Vital Signs: BP 108/70   Pulse 97   Temp 98.3 F (36.8 C)   Resp 16   Ht 5' 1 (1.549 m)   Wt 149 lb (67.6 kg)   LMP 12/13/1991   SpO2 96%   BMI 28.15 kg/m    Review of Systems  Constitutional:  Negative for fatigue.  Respiratory: Negative.  Negative for cough, chest tightness, shortness of breath and wheezing.   Cardiovascular: Negative.  Negative for chest pain and palpitations.  Musculoskeletal:  Positive for arthralgias and joint swelling (left wrist, with likely ganglion cyst).    Physical Exam Vitals reviewed.  Constitutional:      General: She is not in acute distress.    Appearance: Normal appearance. She is not ill-appearing.  HENT:     Head: Normocephalic and atraumatic.  Eyes:     Pupils: Pupils are equal, round, and reactive to light.  Cardiovascular:     Rate and Rhythm: Normal rate and regular rhythm.  Pulmonary:     Effort: Pulmonary effort is normal. No respiratory distress.  Musculoskeletal:     Left wrist: Swelling and tenderness (raised area resembling likely ganglion cyst) present. Decreased range of motion.  Skin:    Findings: Lesion (multiple moles noted, wants to see dermatology for TBSE) present.   Neurological:     Mental Status: She is alert and oriented to person, place, and time.  Psychiatric:        Mood and Affect: Mood normal.        Behavior: Behavior normal.      Assessment/Plan: 1. Ganglion cyst of volar aspect of left wrist (Primary) Prednisone  taper prescribed, take until gone as directed. Wear compression sleeve for 3-5 days to help decrease swelling  - predniSONE  (STERAPRED UNI-PAK 21 TAB) 10 MG (21) TBPK tablet; Use as directed for 6 days  Dispense: 21 tablet; Refill: 0  2. Acute  pain of left wrist Prednisone  prescribed.  - predniSONE  (STERAPRED UNI-PAK 21 TAB) 10 MG (21) TBPK tablet; Use as directed for 6 days  Dispense: 21 tablet; Refill: 0  3. Multiple atypical skin moles Referred to dermatology to evaluate moles and for TBSE - Ambulatory referral to Dermatology   General Counseling: Tenee verbalizes understanding of the findings of todays visit and agrees with plan of treatment. I have discussed any further diagnostic evaluation that may be needed or ordered today. We also reviewed her medications today. she has been encouraged to call the office with any questions or concerns that should arise related to todays visit.    Counseling:    Orders Placed This Encounter  Procedures   Ambulatory referral to Dermatology    Meds ordered this encounter  Medications   predniSONE  (STERAPRED UNI-PAK 21 TAB) 10 MG (21) TBPK tablet    Sig: Use as directed for 6 days    Dispense:  21 tablet    Refill:  0    Return if symptoms worsen or fail to improve.  [place] Controlled Substance Database was reviewed by me for overdose risk score (ORS)  Time spent:30 Minutes Time spent with patient included reviewing progress notes, labs, imaging studies, and discussing plan for follow up.   This patient was seen by Mardy Maxin, FNP-C in collaboration with Dr. Sigrid Bathe as a part of collaborative care agreement.  Leane Loring R. Maxin, MSN, FNP-C Internal  Medicine

## 2023-12-27 ENCOUNTER — Telehealth: Payer: Self-pay | Admitting: Nurse Practitioner

## 2023-12-27 NOTE — Telephone Encounter (Signed)
Dermatology referral sent via [facility] to [facility].  Lvm for patient. Gave telephone # [phone]

## 2024-01-10 ENCOUNTER — Ambulatory Visit (INDEPENDENT_AMBULATORY_CARE_PROVIDER_SITE_OTHER): Payer: Medicare Other | Admitting: Gastroenterology

## 2024-01-16 ENCOUNTER — Telehealth: Payer: Self-pay

## 2024-01-16 ENCOUNTER — Ambulatory Visit: Payer: Medicare Other | Admitting: Nurse Practitioner

## 2024-01-16 ENCOUNTER — Other Ambulatory Visit: Payer: Self-pay

## 2024-01-16 MED ORDER — LEVOFLOXACIN 500 MG PO TABS: 500.0000 mg | ORAL_TABLET | Freq: Every day | ORAL | 0 refills | Status: DC

## 2024-01-16 NOTE — Telephone Encounter (Signed)
 Spoke with pt sinusitis,no coughing ,headache,face hurt and blowing out green and yellow mucus and green mucus and Covid test is negative as per dr Meredeth Stallion send Levaquin  for 10 days and also take OTC Tylenol  cold and flu

## 2024-01-23 DIAGNOSIS — M5431 Sciatica, right side: Secondary | ICD-10-CM | POA: Diagnosis not present

## 2024-01-23 DIAGNOSIS — M797 Fibromyalgia: Secondary | ICD-10-CM | POA: Diagnosis not present

## 2024-01-23 DIAGNOSIS — M545 Low back pain, unspecified: Secondary | ICD-10-CM | POA: Diagnosis not present

## 2024-01-23 DIAGNOSIS — M25562 Pain in left knee: Secondary | ICD-10-CM | POA: Diagnosis not present

## 2024-01-23 DIAGNOSIS — G8929 Other chronic pain: Secondary | ICD-10-CM | POA: Diagnosis not present

## 2024-01-23 DIAGNOSIS — M62838 Other muscle spasm: Secondary | ICD-10-CM | POA: Diagnosis not present

## 2024-02-19 ENCOUNTER — Ambulatory Visit: Payer: Medicare Other | Admitting: Nurse Practitioner

## 2024-02-20 DIAGNOSIS — M62838 Other muscle spasm: Secondary | ICD-10-CM | POA: Diagnosis not present

## 2024-02-20 DIAGNOSIS — G894 Chronic pain syndrome: Secondary | ICD-10-CM | POA: Diagnosis not present

## 2024-02-20 DIAGNOSIS — M25562 Pain in left knee: Secondary | ICD-10-CM | POA: Diagnosis not present

## 2024-02-20 DIAGNOSIS — M5431 Sciatica, right side: Secondary | ICD-10-CM | POA: Diagnosis not present

## 2024-02-20 DIAGNOSIS — M545 Low back pain, unspecified: Secondary | ICD-10-CM | POA: Diagnosis not present

## 2024-02-20 DIAGNOSIS — M797 Fibromyalgia: Secondary | ICD-10-CM | POA: Diagnosis not present

## 2024-02-20 DIAGNOSIS — G8929 Other chronic pain: Secondary | ICD-10-CM | POA: Diagnosis not present

## 2024-03-06 ENCOUNTER — Ambulatory Visit: Admitting: Nurse Practitioner

## 2024-03-13 DIAGNOSIS — M1712 Unilateral primary osteoarthritis, left knee: Secondary | ICD-10-CM | POA: Diagnosis not present

## 2024-03-18 DIAGNOSIS — G8929 Other chronic pain: Secondary | ICD-10-CM | POA: Diagnosis not present

## 2024-03-18 DIAGNOSIS — M545 Low back pain, unspecified: Secondary | ICD-10-CM | POA: Diagnosis not present

## 2024-03-18 DIAGNOSIS — M797 Fibromyalgia: Secondary | ICD-10-CM | POA: Diagnosis not present

## 2024-03-18 DIAGNOSIS — M62838 Other muscle spasm: Secondary | ICD-10-CM | POA: Diagnosis not present

## 2024-03-18 DIAGNOSIS — M5431 Sciatica, right side: Secondary | ICD-10-CM | POA: Diagnosis not present

## 2024-03-18 DIAGNOSIS — M25562 Pain in left knee: Secondary | ICD-10-CM | POA: Diagnosis not present

## 2024-03-24 ENCOUNTER — Telehealth: Payer: Self-pay | Admitting: Nurse Practitioner

## 2024-03-24 NOTE — Telephone Encounter (Signed)
 Left vm and sent mychart message to confirm 03/31/24 appointment-Toni

## 2024-03-31 ENCOUNTER — Encounter: Payer: Self-pay | Admitting: Nurse Practitioner

## 2024-03-31 ENCOUNTER — Ambulatory Visit (INDEPENDENT_AMBULATORY_CARE_PROVIDER_SITE_OTHER): Admitting: Nurse Practitioner

## 2024-03-31 VITALS — BP 110/74 | HR 86 | Temp 98.3°F | Resp 16 | Ht 61.0 in | Wt 154.0 lb

## 2024-03-31 DIAGNOSIS — R319 Hematuria, unspecified: Secondary | ICD-10-CM | POA: Diagnosis not present

## 2024-03-31 DIAGNOSIS — Z0000 Encounter for general adult medical examination without abnormal findings: Secondary | ICD-10-CM | POA: Diagnosis not present

## 2024-03-31 DIAGNOSIS — E039 Hypothyroidism, unspecified: Secondary | ICD-10-CM

## 2024-03-31 DIAGNOSIS — G4762 Sleep related leg cramps: Secondary | ICD-10-CM

## 2024-03-31 DIAGNOSIS — J301 Allergic rhinitis due to pollen: Secondary | ICD-10-CM

## 2024-03-31 DIAGNOSIS — Z79899 Other long term (current) drug therapy: Secondary | ICD-10-CM | POA: Diagnosis not present

## 2024-03-31 DIAGNOSIS — G2581 Restless legs syndrome: Secondary | ICD-10-CM

## 2024-03-31 DIAGNOSIS — R300 Dysuria: Secondary | ICD-10-CM

## 2024-03-31 DIAGNOSIS — N390 Urinary tract infection, site not specified: Secondary | ICD-10-CM | POA: Diagnosis not present

## 2024-03-31 DIAGNOSIS — E785 Hyperlipidemia, unspecified: Secondary | ICD-10-CM | POA: Diagnosis not present

## 2024-03-31 DIAGNOSIS — Z1231 Encounter for screening mammogram for malignant neoplasm of breast: Secondary | ICD-10-CM

## 2024-03-31 DIAGNOSIS — U071 COVID-19: Secondary | ICD-10-CM

## 2024-03-31 DIAGNOSIS — F410 Panic disorder [episodic paroxysmal anxiety] without agoraphobia: Secondary | ICD-10-CM

## 2024-03-31 DIAGNOSIS — E1169 Type 2 diabetes mellitus with other specified complication: Secondary | ICD-10-CM | POA: Diagnosis not present

## 2024-03-31 DIAGNOSIS — F16283 Hallucinogen dependence with hallucinogen persisting perception disorder (flashbacks): Secondary | ICD-10-CM

## 2024-03-31 DIAGNOSIS — F411 Generalized anxiety disorder: Secondary | ICD-10-CM

## 2024-03-31 DIAGNOSIS — H9311 Tinnitus, right ear: Secondary | ICD-10-CM

## 2024-03-31 DIAGNOSIS — Z760 Encounter for issue of repeat prescription: Secondary | ICD-10-CM

## 2024-03-31 LAB — POCT URINALYSIS DIPSTICK
Bilirubin, UA: NEGATIVE
Glucose, UA: POSITIVE — AB
Ketones, UA: NEGATIVE
Leukocytes, UA: NEGATIVE
Nitrite, UA: NEGATIVE
Protein, UA: NEGATIVE
Spec Grav, UA: 1.015 (ref 1.010–1.025)
Urobilinogen, UA: 0.2 U/dL
pH, UA: 5 (ref 5.0–8.0)

## 2024-03-31 LAB — POCT URINE DRUG SCREEN
Methylenedioxyamphetamine: NOT DETECTED
POC Amphetamine UR: NOT DETECTED
POC BENZODIAZEPINES UR: NOT DETECTED
POC Barbiturate UR: NOT DETECTED
POC Cocaine UR: NOT DETECTED
POC Ecstasy UR: NOT DETECTED
POC Marijuana UR: NOT DETECTED
POC Methadone UR: NOT DETECTED
POC Methamphetamine UR: NOT DETECTED
POC Opiate Ur: NOT DETECTED
POC Oxycodone UR: NOT DETECTED
POC PHENCYCLIDINE UR: NOT DETECTED
POC TRICYCLICS UR: NOT DETECTED

## 2024-03-31 LAB — POCT GLYCOSYLATED HEMOGLOBIN (HGB A1C): Hemoglobin A1C: 6.4 % — AB (ref 4.0–5.6)

## 2024-03-31 MED ORDER — TRAZODONE HCL 100 MG PO TABS
100.0000 mg | ORAL_TABLET | Freq: Every day | ORAL | 1 refills | Status: DC
Start: 2024-03-31 — End: 2024-07-18

## 2024-03-31 MED ORDER — ATORVASTATIN CALCIUM 10 MG PO TABS: 10.0000 mg | ORAL_TABLET | Freq: Every day | ORAL | 5 refills | Status: DC

## 2024-03-31 MED ORDER — IBUPROFEN 800 MG PO TABS: 800.0000 mg | ORAL_TABLET | Freq: Three times a day (TID) | ORAL | 1 refills | Status: DC | PRN

## 2024-03-31 MED ORDER — CARIPRAZINE HCL 3 MG PO CAPS: 3.0000 mg | ORAL_CAPSULE | Freq: Every day | ORAL | 5 refills | Status: DC

## 2024-03-31 MED ORDER — PANTOPRAZOLE SODIUM 40 MG PO TBEC: 40.0000 mg | DELAYED_RELEASE_TABLET | Freq: Every day | ORAL | 1 refills | Status: DC

## 2024-03-31 MED ORDER — SULFAMETHOXAZOLE-TRIMETHOPRIM 800-160 MG PO TABS: 1.0000 | ORAL_TABLET | Freq: Two times a day (BID) | ORAL | 0 refills | Status: AC

## 2024-03-31 MED ORDER — LEVOTHYROXINE SODIUM 50 MCG PO TABS: 50.0000 ug | ORAL_TABLET | Freq: Every day | ORAL | 1 refills | Status: DC

## 2024-03-31 MED ORDER — LEVOCETIRIZINE DIHYDROCHLORIDE 5 MG PO TABS
5.0000 mg | ORAL_TABLET | Freq: Every evening | ORAL | 1 refills | Status: DC
Start: 2024-03-31 — End: 2024-07-29

## 2024-03-31 MED ORDER — ALPRAZOLAM 0.5 MG PO TABS
0.5000 mg | ORAL_TABLET | Freq: Two times a day (BID) | ORAL | 2 refills | Status: DC | PRN
Start: 2024-03-31 — End: 2024-07-29

## 2024-03-31 MED ORDER — COMBIVENT RESPIMAT 20-100 MCG/ACT IN AERS: INHALATION_SPRAY | RESPIRATORY_TRACT | 1 refills | Status: AC

## 2024-03-31 MED ORDER — DAPAGLIFLOZIN PROPANEDIOL 10 MG PO TABS: ORAL_TABLET | ORAL | 1 refills | Status: DC

## 2024-03-31 NOTE — Progress Notes (Signed)
 [facility] [address] [place], [place] [address]  Internal MEDICINE  Office Visit Note  Patient Name: [name] [name]  [id number]  [id number]  Date of Service: 03/31/2024  Chief Complaint  Patient presents with   Diabetes   Hyperlipidemia   Medicare Wellness    HPI [name] [name] presents for a medicare annual wellness visit.  Well-appearing 48 y.o. female with diabetes, chronic back pain, hypertension, high cholesterol,  Routine CRC screening: positive cologuard in April last year. Colonoscopy attempted in may last year but the prep was suboptimal. Colonoscopy repeated in October last year and it was normal, no biopsies were obtained.  Routine mammogram: due now DEXA scan: not due yet Eye exam: see by eye doctor in August 2024  foot exam: done  Labs:  due for routine labs  New or worsening pain: chronic pain, sees  Other concerns: due for refills of medications.       03/31/2024    2:41 PM 02/16/2023    8:55 AM 01/30/2022   10:32 AM  MMSE - Mini Mental State Exam  Orientation to time 5 5 5   Orientation to Place 5 5 5   Registration 3 3 3   Attention/ Calculation 5 5 5   Recall 3 3 3   Language- name 2 objects 2 2 2   Language- repeat 1 1 1   Language- follow 3 step command 3 3 3   Language- read & follow direction 1 1 1   Write a sentence 1 1 1   Copy design 1 1 1   Total score 30 30 30     Functional Status Survey: Is the patient deaf or have difficulty hearing?: No Does the patient have difficulty seeing, even when wearing glasses/contacts?: Yes Does the patient have difficulty concentrating, remembering, or making decisions?: Yes Does the patient have difficulty walking or climbing stairs?: Yes Does the patient have difficulty dressing or bathing?: No Does the patient have difficulty doing errands alone such as visiting a doctor's office or shopping?: No     10/18/2022    2:43 PM 01/04/2023    3:57 PM 02/16/2023    8:54 AM 06/05/2023    2:39 PM 03/31/2024     2:40 PM  Fall Risk  Falls in the past year? 1 1 0 0 0  Was there an injury with Fall? 0 1 0 0 0  Fall Risk Category Calculator 2 2 0 0 0  Fall Risk Category (Retired) Moderate      (RETIRED) Patient Fall Risk Level Moderate fall risk      Patient at Risk for Falls Due to   No Fall Risks No Fall Risks No Fall Risks  Fall risk Follow up Falls evaluation completed  Falls evaluation completed Falls evaluation completed Falls evaluation completed       03/31/2024    2:40 PM  Depression screen PHQ 2/9  Decreased Interest 0  Down, Depressed, Hopeless 0  PHQ - 2 Score 0        Current Medication: Outpatient Encounter Medications as of 03/31/2024  Medication Sig   Accu-Chek Softclix Lancets lancets Use 1 lancet to check glucose level once daily   albuterol  (VENTOLIN  HFA) 108 (90 Base) MCG/ACT inhaler Inhale 1-2 puffs into the lungs every 6 (six) hours as needed for wheezing or shortness of breath.   ammonium lactate  (LAC-HYDRIN ) 12 % lotion Apply 1 application topically as needed for dry skin.   Blood Glucose Monitoring Suppl (ACCU-CHEK GUIDE ME) w/Device KIT Use as directed. E11.65   Cholecalciferol  25 MCG (  1000 UT) tablet Take 1 tablet (1,000 Units total) by mouth once a week.   diclofenac Sodium (VOLTAREN) 1 % GEL SMARTSIG:2-4 Gram(s) Topical 1-4 Times Daily PRN   fluticasone  (FLONASE ) 50 MCG/ACT nasal spray Place 2 sprays into both nostrils daily.   glucose blood (ACCU-CHEK GUIDE) test strip Use 1 test strip to check fasting glucose once daily and as need for diabetes   HYDROmorphone (DILAUDID) 4 MG tablet Take 4 mg by mouth 4 (four) times daily as needed.   Insulin  Pen Needle 31G X 5 MM MISC Use as directed  Once a day Dx e11.65   levocetirizine (XYZAL ) 5 MG tablet Take 1 tablet (5 mg total) by mouth every evening.   methocarbamol (ROBAXIN) 500 MG tablet PLEASE SEE ATTACHED FOR DETAILED DIRECTIONS   morphine  (MS CONTIN ) 60 MG 12 hr tablet Take 60 mg by mouth every 12 (twelve) hours.    Multiple Vitamin (MULTI-VITAMIN) tablet Take 1 tablet by mouth daily.   nystatin  (MYCOSTATIN /NYSTOP ) powder Apply 1 Application topically 3 (three) times daily.   ondansetron  (ZOFRAN -ODT) 4 MG disintegrating tablet Take 1 tablet (4 mg total) by mouth every 8 (eight) hours as needed for nausea or vomiting.   pregabalin (LYRICA) 150 MG capsule Take 150 mg by mouth 3 (three) times daily.    promethazine  (PHENERGAN ) 25 MG tablet Take 1 tablet (25 mg total) by mouth every 8 (eight) hours as needed for nausea or vomiting.   rOPINIRole  (REQUIP ) 0.25 MG tablet Take 1-2 tabs at night for restless legs   Semaglutide , 2 MG/DOSE, (OZEMPIC , 2 MG/DOSE,) 8 MG/3ML SOPN INJECT 2MG  UNDER SKIN WEEKLY   traZODone  (DESYREL ) 100 MG tablet Take 1 tablet (100 mg total) by mouth at bedtime.   Vitamin D , Ergocalciferol , (DRISDOL ) 1.25 MG (50000 UNIT) CAPS capsule Take 1 capsule (50,000 Units total) by mouth every 7 (seven) days.   [DISCONTINUED] ALPRAZolam  (XANAX ) 0.5 MG tablet Take 1 tablet (0.5 mg total) by mouth at bedtime as needed for anxiety.   [DISCONTINUED] atorvastatin  (LIPITOR) 10 MG tablet Take 1 tablet (10 mg total) by mouth daily.   [DISCONTINUED] cariprazine  (VRAYLAR ) 3 MG capsule Take 1 capsule (3 mg total) by mouth daily.   [DISCONTINUED] COMBIVENT  RESPIMAT 20-100 MCG/ACT AERS respimat INHALE 1 PUFF INTO THE LUNGS EVERY 6 HOURS AS NEEDED FOR WHEEZING   [DISCONTINUED] dapagliflozin  propanediol (FARXIGA ) 10 MG TABS tablet Take 1 tab po daily   [DISCONTINUED] ibuprofen  (ADVIL ) 800 MG tablet Take 1 tablet (800 mg total) by mouth every 8 (eight) hours as needed for moderate pain (pain score 4-6).   [DISCONTINUED] levofloxacin  (LEVAQUIN ) 500 MG tablet Take 1 tablet (500 mg total) by mouth daily.   [DISCONTINUED] levothyroxine  (SYNTHROID ) 50 MCG tablet TAKE 1 TABLET(50 MCG) BY MOUTH DAILY   [DISCONTINUED] lubiprostone  (AMITIZA ) 24 MCG capsule Take 1 capsule (24 mcg total) by mouth 2 (two) times daily with a meal.    [DISCONTINUED] meclizine  (ANTIVERT ) 25 MG tablet Take 1 tablet (25 mg total) by mouth 3 (three) times daily as needed for dizziness.   [DISCONTINUED] OVER THE COUNTER MEDICATION Anthony's organic psyllium husk powder. Takes daily   [DISCONTINUED] pantoprazole  (PROTONIX ) 40 MG tablet Take 1 tablet (40 mg total) by mouth daily.   [DISCONTINUED] predniSONE  (STERAPRED UNI-PAK 21 TAB) 10 MG (21) TBPK tablet Use as directed for 6 days   [DISCONTINUED] traZODone  (DESYREL ) 50 MG tablet Take 0.5-1 tablets (25-50 mg total) by mouth at bedtime as needed for sleep.   ALPRAZolam  (XANAX ) 0.5 MG tablet Take  1 tablet (0.5 mg total) by mouth 2 (two) times daily as needed for anxiety or sleep.   atorvastatin  (LIPITOR) 10 MG tablet Take 1 tablet (10 mg total) by mouth daily.   cariprazine  (VRAYLAR ) 3 MG capsule Take 1 capsule (3 mg total) by mouth daily.   dapagliflozin  propanediol (FARXIGA ) 10 MG TABS tablet Take 1 tab po daily   ibuprofen  (ADVIL ) 800 MG tablet Take 1 tablet (800 mg total) by mouth every 8 (eight) hours as needed for moderate pain (pain score 4-6).   Ipratropium-Albuterol  (COMBIVENT  RESPIMAT) 20-100 MCG/ACT AERS respimat INHALE 1 PUFF INTO THE LUNGS EVERY 6 HOURS AS NEEDED FOR WHEEZING   levothyroxine  (SYNTHROID ) 50 MCG tablet Take 1 tablet (50 mcg total) by mouth daily before breakfast.   pantoprazole  (PROTONIX ) 40 MG tablet Take 1 tablet (40 mg total) by mouth daily.   No facility-administered encounter medications on file as of 03/31/2024.    Surgical History: Past Surgical History:  Procedure Laterality Date   ABDOMINAL HYSTERECTOMY     CARPAL TUNNEL RELEASE     COLONOSCOPY WITH PROPOFOL  N/A 05/09/2023   Procedure: COLONOSCOPY WITH PROPOFOL ;  Surgeon: Luke Salaam, MD;  Location: [facility] ENDOSCOPY;  Service: Gastroenterology;  Laterality: N/A;   COLONOSCOPY WITH PROPOFOL  N/A 10/09/2023   Procedure: COLONOSCOPY WITH PROPOFOL ;  Surgeon: Urban Garden, MD;  Location: [facility] ENDO SUITE;   Service: Gastroenterology;  Laterality: N/A;  1:00PM;ASA 3, bumpt up to 12:45 per Mindy   ESOPHAGOGASTRODUODENOSCOPY (EGD) WITH PROPOFOL  N/A 05/09/2023   Procedure: ESOPHAGOGASTRODUODENOSCOPY (EGD) WITH PROPOFOL ;  Surgeon: Luke Salaam, MD;  Location: [facility] ENDOSCOPY;  Service: Gastroenterology;  Laterality: N/A;   FOOT TENDON SURGERY Right    KNEE SURGERY     SPINAL FUSION     SPINAL FUSION      Medical History: Past Medical History:  Diagnosis Date   Actinic keratitis    Allergy    Anxiety    Asthma    Basal cell carcinoma    Chronic venous insufficiency    DDD (degenerative disc disease), lumbar    Diabetes mellitus without complication (HCC)    Fibromyalgia    Fibromyalgia    Hyperlipidemia    Lymphedema    Melanoma (HCC)    Migraine    Mitral valve prolapse    Sleep apnea    Tendonitis of foot    left    Family History: Family History  Problem Relation Age of Onset   Cancer Paternal Aunt    Cancer Paternal Uncle    Diabetes Paternal Grandfather    Heart disease Paternal Grandfather     Social History   Socioeconomic History   Marital status: Divorced    Spouse name: Not on file   Number of children: Not on file   Years of education: Not on file   Highest education level: Not on file  Occupational History   Not on file  Tobacco Use   Smoking status: Never    Passive exposure: Past   Smokeless tobacco: Never  Vaping Use   Vaping status: Never Used  Substance and Sexual Activity   Alcohol use: No   Drug use: No   Sexual activity: Not on file  Other Topics Concern   Not on file  Social History Narrative   Not on file   Social Drivers of Health   Financial Resource Strain: Not on file  Food Insecurity: No Food Insecurity (04/07/2022)   Received from [facility], [facility]   Hunger Vital Sign  Worried About Programme researcher, broadcasting/film/video in the Last Year: Never true    Ran Out of Food in the Last Year: Never true  Transportation Needs: Not on file   Physical Activity: Not on file  Stress: Not on file  Social Connections: Unknown (04/09/2022)   Received from [facility], [facility]   Social Network    Social Network: Not on file  Intimate Partner Violence: Unknown (03/13/2022)   Received from [facility], [facility]   HITS    Physically Hurt: Not on file    Insult or Talk Down To: Not on file    Threaten Physical Harm: Not on file    Scream or Curse: Not on file      Review of Systems  Constitutional:  Negative for activity change, appetite change, chills, fatigue, fever and unexpected weight change.  HENT: Negative.  Negative for congestion, ear pain, rhinorrhea, sore throat and trouble swallowing.   Eyes: Negative.   Respiratory: Negative.  Negative for cough, chest tightness, shortness of breath and wheezing.   Cardiovascular: Negative.  Negative for chest pain and palpitations.  Gastrointestinal:  Positive for diarrhea, nausea and vomiting. Negative for abdominal pain, blood in stool and constipation.  Endocrine: Negative.   Genitourinary: Negative.  Negative for difficulty urinating, dysuria, frequency, hematuria and urgency.  Musculoskeletal:  Positive for arthralgias and back pain. Negative for joint swelling, myalgias and neck pain.  Skin: Negative.  Negative for rash and wound.  Allergic/Immunologic: Negative.  Negative for immunocompromised state.  Neurological: Negative.  Negative for dizziness, seizures, numbness and headaches.  Hematological: Negative.   Psychiatric/Behavioral:  Positive for behavioral problems, dysphoric mood and sleep disturbance. Negative for self-injury and suicidal ideas. The patient is nervous/anxious.     Vital Signs: BP 110/74   Pulse 86   Temp 98.3 F (36.8 C)   Resp 16   Ht 5\' 1"  (1.549 m)   Wt 154 lb (69.9 kg)   LMP 12/13/1991   SpO2 99%   BMI 29.10 kg/m    Physical Exam Vitals reviewed.  Constitutional:      General: She is awake. She is not in acute distress.     Appearance: Normal appearance. She is well-developed, well-groomed and normal weight. She is not ill-appearing or diaphoretic.  HENT:     Head: Normocephalic and atraumatic.     Right Ear: Tympanic membrane, ear canal and external ear normal.     Left Ear: Tympanic membrane, ear canal and external ear normal.     Nose: Nose normal. No congestion or rhinorrhea.     Mouth/Throat:     Lips: Pink.     Mouth: Mucous membranes are moist.     Pharynx: Oropharynx is clear. Uvula midline. No oropharyngeal exudate or posterior oropharyngeal erythema.  Eyes:     General: Lids are normal. Vision grossly intact. Gaze aligned appropriately. No scleral icterus.       Right eye: No discharge.        Left eye: No discharge.     Extraocular Movements: Extraocular movements intact.     Conjunctiva/sclera: Conjunctivae normal.     Pupils: Pupils are equal, round, and reactive to light.     Funduscopic exam:    Right eye: Red reflex present.        Left eye: Red reflex present. Neck:     Thyroid : No thyromegaly.     Vascular: No JVD.     Trachea: Trachea and phonation normal. No tracheal deviation.  Cardiovascular:     Rate and Rhythm: Normal rate and regular rhythm.     Pulses: Normal pulses.          Dorsalis pedis pulses are 2+ on the right side and 2+ on the left side.       Posterior tibial pulses are 2+ on the right side and 2+ on the left side.     Heart sounds: Normal heart sounds, S1 normal and S2 normal. No murmur heard.    No friction rub. No gallop.  Pulmonary:     Effort: Pulmonary effort is normal. No accessory muscle usage or respiratory distress.     Breath sounds: Normal breath sounds and air entry. No stridor. No wheezing or rales.  Chest:     Chest wall: No tenderness.     Comments: Declined clinical breast exam, mammogram ordered.  Abdominal:     General: Bowel sounds are normal. There is no distension.     Palpations: Abdomen is soft. There is no mass.     Tenderness: There  is no abdominal tenderness. There is no guarding or rebound.  Musculoskeletal:        General: No tenderness or deformity. Normal range of motion.     Cervical back: Normal range of motion and neck supple.     Right lower leg: No edema.     Left lower leg: No edema.     Right foot: Normal range of motion. No deformity, bunion, Charcot foot, foot drop or prominent metatarsal heads.     Left foot: Normal range of motion. No deformity, bunion, Charcot foot, foot drop or prominent metatarsal heads.  Feet:     Right foot:     Protective Sensation: 6 sites tested.  6 sites sensed.     Skin integrity: Callus present. No ulcer, blister, skin breakdown, erythema, warmth, dry skin or fissure.     Toenail Condition: Right toenails are abnormally thick.     Left foot:     Protective Sensation: 6 sites tested.  6 sites sensed.     Skin integrity: Callus present. No ulcer, blister, skin breakdown, erythema, warmth, dry skin or fissure.     Toenail Condition: Left toenails are abnormally thick.  Lymphadenopathy:     Cervical: No cervical adenopathy.  Skin:    General: Skin is warm and dry.     Capillary Refill: Capillary refill takes less than 2 seconds.     Coloration: Skin is not pale.     Findings: No erythema or rash.  Neurological:     Mental Status: She is alert and oriented to person, place, and time.     Cranial Nerves: No cranial nerve deficit.     Motor: No abnormal muscle tone.     Coordination: Coordination normal.     Gait: Gait normal.     Deep Tendon Reflexes: Reflexes are normal and symmetric.  Psychiatric:        Attention and Perception: Attention normal.        Mood and Affect: Mood is depressed. Affect is tearful.        Speech: Speech is delayed.        Behavior: Behavior normal. Behavior is cooperative.        Thought Content: Thought content normal.        Judgment: Judgment normal.        Assessment/Plan: 1. Encounter for subsequent annual wellness visit (AWV) in  Medicare patient (Primary) Age-appropriate preventive screenings and vaccinations discussed.  Routine labs for health maintenance will be ordered. PHM updated.  Medication list reviewed, updated and refills ordered.  - pantoprazole  (PROTONIX ) 40 MG tablet; Take 1 tablet (40 mg total) by mouth daily.  Dispense: 90 tablet; Refill: 1 - ibuprofen  (ADVIL ) 800 MG tablet; Take 1 tablet (800 mg total) by mouth every 8 (eight) hours as needed for moderate pain (pain score 4-6).  Dispense: 60 tablet; Refill: 1 - Ipratropium-Albuterol  (COMBIVENT  RESPIMAT) 20-100 MCG/ACT AERS respimat; INHALE 1 PUFF INTO THE LUNGS EVERY 6 HOURS AS NEEDED FOR WHEEZING  Dispense: 4 g; Refill: 1 - cariprazine  (VRAYLAR ) 3 MG capsule; Take 1 capsule (3 mg total) by mouth daily.  Dispense: 30 capsule; Refill: 5 - traZODone  (DESYREL ) 100 MG tablet; Take 1 tablet (100 mg total) by mouth at bedtime.  Dispense: 90 tablet; Refill: 1 - levocetirizine (XYZAL ) 5 MG tablet; Take 1 tablet (5 mg total) by mouth every evening.  Dispense: 90 tablet; Refill: 1  2. Urinary tract infection with hematuria, site unspecified Urine sent to lab for culture. Bactrim  prescribed, take until gone.  - CULTURE, URINE COMPREHENSIVE - sulfamethoxazole -trimethoprim  (BACTRIM  DS) 800-160 MG tablet; Take 1 tablet by mouth 2 (two) times daily for 5 days. Take with food  Dispense: 10 tablet; Refill: 0  3. Type 2 diabetes mellitus with other specified complication, without long-term current use of insulin  (HCC) A1c is stable at 6.4. continue farxiga  as prescribed.  - POCT glycosylated hemoglobin (Hb A1C) - dapagliflozin  propanediol (FARXIGA ) 10 MG TABS tablet; Take 1 tab po daily  Dispense: 90 tablet; Refill: 1  4. Hyperlipidemia associated with type 2 diabetes mellitus (HCC) Continue atorvastatin  as prescribed.  - atorvastatin  (LIPITOR) 10 MG tablet; Take 1 tablet (10 mg total) by mouth daily.  Dispense: 30 tablet; Refill: 5  5. Acquired hypothyroidism Continue  levothyroxine  as prescribed  - levothyroxine  (SYNTHROID ) 50 MCG tablet; Take 1 tablet (50 mcg total) by mouth daily before breakfast.  Dispense: 90 tablet; Refill: 1  6. Tinnitus of right ear Referred to ENT - Ambulatory referral to ENT  7. Dysuria Urinalysis done,  - POCT Urinalysis Dipstick - sulfamethoxazole -trimethoprim  (BACTRIM  DS) 800-160 MG tablet; Take 1 tablet by mouth 2 (two) times daily for 5 days. Take with food  Dispense: 10 tablet; Refill: 0  8. Encounter for screening mammogram for malignant neoplasm of breast Routine mammogram ordered  - MM 3D SCREENING MAMMOGRAM BILATERAL BREAST; Future  9. High risk medication use UDS done in office today. UDS negative, patient does not take her alprazolam  every day, she only takes it as needed.  - POCT Urine Drug Screen  10. GAD (generalized anxiety disorder) Continue prn alprazolam  as prescribed. Follow up in 3 months for additional refills.  - ALPRAZolam  (XANAX ) 0.5 MG tablet; Take 1 tablet (0.5 mg total) by mouth 2 (two) times daily as needed for anxiety or sleep.  Dispense: 60 tablet; Refill: 2    General Counseling: [name] [name] verbalizes understanding of the findings of todays visit and agrees with plan of treatment. I have discussed any further diagnostic evaluation that may be needed or ordered today. We also reviewed her medications today. she has been encouraged to call the office with any questions or concerns that should arise related to todays visit.    Orders Placed This Encounter  Procedures   CULTURE, URINE COMPREHENSIVE   MM 3D SCREENING MAMMOGRAM BILATERAL BREAST   Ambulatory referral to ENT   POCT glycosylated hemoglobin (Hb A1C)   POCT Urinalysis Dipstick   POCT Urine  Drug Screen    Meds ordered this encounter  Medications   pantoprazole  (PROTONIX ) 40 MG tablet    Sig: Take 1 tablet (40 mg total) by mouth daily.    Dispense:  90 tablet    Refill:  1   levothyroxine  (SYNTHROID ) 50 MCG tablet    Sig:  Take 1 tablet (50 mcg total) by mouth daily before breakfast.    Dispense:  90 tablet    Refill:  1   ibuprofen  (ADVIL ) 800 MG tablet    Sig: Take 1 tablet (800 mg total) by mouth every 8 (eight) hours as needed for moderate pain (pain score 4-6).    Dispense:  60 tablet    Refill:  1    For future refills, keep on file   dapagliflozin  propanediol (FARXIGA ) 10 MG TABS tablet    Sig: Take 1 tab po daily    Dispense:  90 tablet    Refill:  1   Ipratropium-Albuterol  (COMBIVENT  RESPIMAT) 20-100 MCG/ACT AERS respimat    Sig: INHALE 1 PUFF INTO THE LUNGS EVERY 6 HOURS AS NEEDED FOR WHEEZING    Dispense:  4 g    Refill:  1   cariprazine  (VRAYLAR ) 3 MG capsule    Sig: Take 1 capsule (3 mg total) by mouth daily.    Dispense:  30 capsule    Refill:  5    Vraylar  was recently approved. Note increased dose to 3 mg. Discontinue 1.5 mg dose.   atorvastatin  (LIPITOR) 10 MG tablet    Sig: Take 1 tablet (10 mg total) by mouth daily.    Dispense:  30 tablet    Refill:  5    For future refills, keep on file   traZODone  (DESYREL ) 100 MG tablet    Sig: Take 1 tablet (100 mg total) by mouth at bedtime.    Dispense:  90 tablet    Refill:  1   ALPRAZolam  (XANAX ) 0.5 MG tablet    Sig: Take 1 tablet (0.5 mg total) by mouth 2 (two) times daily as needed for anxiety or sleep.    Dispense:  60 tablet    Refill:  2    Patient is aware to not take alprazolam  with her narcotic pain medications.   levocetirizine (XYZAL ) 5 MG tablet    Sig: Take 1 tablet (5 mg total) by mouth every evening.    Dispense:  90 tablet    Refill:  1    Return in about 4 weeks (around 04/28/2024) for F/U, Labs, [name] [name] PCP.   Total time spent:30 Minutes Time spent includes review of chart, medications, test results, and follow up plan with the patient.   [place] Controlled Substance Database was reviewed by me.  This patient was seen by Laurence Pons, FNP-C in collaboration with Dr. Verneta Gone as a part of collaborative care  agreement.  Kaytlen Lightsey R. Bobbi Burow, MSN, FNP-C Internal medicine

## 2024-04-01 ENCOUNTER — Telehealth: Payer: Self-pay | Admitting: Nurse Practitioner

## 2024-04-01 ENCOUNTER — Encounter: Payer: Self-pay | Admitting: Nurse Practitioner

## 2024-04-01 NOTE — Telephone Encounter (Signed)
 Awaiting 03/31/24 office notes for Otolaryngology referral-Toni

## 2024-04-01 NOTE — Telephone Encounter (Signed)
 Pt advised we sent twice day yesterday

## 2024-04-02 ENCOUNTER — Other Ambulatory Visit: Payer: Self-pay | Admitting: Nurse Practitioner

## 2024-04-02 DIAGNOSIS — E1165 Type 2 diabetes mellitus with hyperglycemia: Secondary | ICD-10-CM | POA: Diagnosis not present

## 2024-04-02 DIAGNOSIS — G4762 Sleep related leg cramps: Secondary | ICD-10-CM | POA: Diagnosis not present

## 2024-04-02 DIAGNOSIS — G2581 Restless legs syndrome: Secondary | ICD-10-CM | POA: Diagnosis not present

## 2024-04-02 DIAGNOSIS — E782 Mixed hyperlipidemia: Secondary | ICD-10-CM | POA: Diagnosis not present

## 2024-04-02 DIAGNOSIS — E559 Vitamin D deficiency, unspecified: Secondary | ICD-10-CM | POA: Diagnosis not present

## 2024-04-02 DIAGNOSIS — H9311 Tinnitus, right ear: Secondary | ICD-10-CM | POA: Diagnosis not present

## 2024-04-03 LAB — CBC WITH DIFFERENTIAL/PLATELET
Basophils Absolute: 0.1 10*3/uL (ref 0.0–0.2)
Basos: 1 %
EOS (ABSOLUTE): 0.5 10*3/uL — ABNORMAL HIGH (ref 0.0–0.4)
Eos: 10 %
Hematocrit: 43.4 % (ref 34.0–46.6)
Hemoglobin: 13.9 g/dL (ref 11.1–15.9)
Immature Grans (Abs): 0 10*3/uL (ref 0.0–0.1)
Immature Granulocytes: 0 %
Lymphocytes Absolute: 1.9 10*3/uL (ref 0.7–3.1)
Lymphs: 36 %
MCH: 28.6 pg (ref 26.6–33.0)
MCHC: 32 g/dL (ref 31.5–35.7)
MCV: 89 fL (ref 79–97)
Monocytes Absolute: 0.3 10*3/uL (ref 0.1–0.9)
Monocytes: 6 %
Neutrophils Absolute: 2.4 10*3/uL (ref 1.4–7.0)
Neutrophils: 47 %
Platelets: 249 10*3/uL (ref 150–450)
RBC: 4.86 x10E6/uL (ref 3.77–5.28)
RDW: 12.5 % (ref 11.7–15.4)
WBC: 5.2 10*3/uL (ref 3.4–10.8)

## 2024-04-03 LAB — COMPREHENSIVE METABOLIC PANEL WITH GFR
ALT: 14 IU/L (ref 0–32)
AST: 15 IU/L (ref 0–40)
Albumin: 4.1 g/dL (ref 3.9–4.9)
Alkaline Phosphatase: 108 IU/L (ref 44–121)
BUN/Creatinine Ratio: 14 (ref 9–23)
BUN: 14 mg/dL (ref 6–24)
Bilirubin Total: 0.3 mg/dL (ref 0.0–1.2)
CO2: 24 mmol/L (ref 20–29)
Calcium: 9 mg/dL (ref 8.7–10.2)
Chloride: 101 mmol/L (ref 96–106)
Creatinine, Ser: 0.97 mg/dL (ref 0.57–1.00)
Globulin, Total: 2.3 g/dL (ref 1.5–4.5)
Glucose: 128 mg/dL — ABNORMAL HIGH (ref 70–99)
Potassium: 4.4 mmol/L (ref 3.5–5.2)
Sodium: 140 mmol/L (ref 134–144)
Total Protein: 6.4 g/dL (ref 6.0–8.5)
eGFR: 72 mL/min/{1.73_m2} (ref >59)

## 2024-04-03 LAB — LIPID PANEL
Chol/HDL Ratio: 5.2 ratio — ABNORMAL HIGH (ref 0.0–4.4)
Cholesterol, Total: 196 mg/dL (ref 100–199)
HDL: 38 mg/dL — ABNORMAL LOW (ref >39)
LDL Chol Calc (NIH): 101 mg/dL — ABNORMAL HIGH (ref 0–99)
Triglycerides: 336 mg/dL — ABNORMAL HIGH (ref 0–149)
VLDL Cholesterol Cal: 57 mg/dL — ABNORMAL HIGH (ref 5–40)

## 2024-04-03 LAB — IRON AND TIBC
Iron Saturation: 27 % (ref 15–55)
Iron: 73 ug/dL (ref 27–159)
Total Iron Binding Capacity: 273 ug/dL (ref 250–450)
UIBC: 200 ug/dL (ref 131–425)

## 2024-04-03 LAB — B12 AND FOLATE PANEL
Folate: 4 ng/mL (ref >3.0)
Vitamin B-12: 301 pg/mL (ref 232–1245)

## 2024-04-03 LAB — T4, FREE: Free T4: 1.11 ng/dL (ref 0.82–1.77)

## 2024-04-03 LAB — CULTURE, URINE COMPREHENSIVE

## 2024-04-03 LAB — MAGNESIUM: Magnesium: 2.1 mg/dL (ref 1.6–2.3)

## 2024-04-03 LAB — FERRITIN: Ferritin: 50 ng/mL (ref 15–150)

## 2024-04-03 LAB — TSH: TSH: 0.943 u[IU]/mL (ref 0.450–4.500)

## 2024-04-03 LAB — VITAMIN D 25 HYDROXY (VIT D DEFICIENCY, FRACTURES): Vit D, 25-Hydroxy: 27.4 ng/mL — ABNORMAL LOW (ref 30.0–100.0)

## 2024-04-13 ENCOUNTER — Encounter: Payer: Self-pay | Admitting: Nurse Practitioner

## 2024-04-13 DIAGNOSIS — R3129 Other microscopic hematuria: Secondary | ICD-10-CM

## 2024-04-13 DIAGNOSIS — E1169 Type 2 diabetes mellitus with other specified complication: Secondary | ICD-10-CM

## 2024-04-14 ENCOUNTER — Inpatient Hospital Stay
Admission: RE | Admit: 2024-04-14 | Discharge: 2024-04-14 | Disposition: A | Discharge status: Home / Self Care | Payer: Self-pay | Source: Ambulatory Visit | Attending: Nurse Practitioner | Admitting: Nurse Practitioner

## 2024-04-14 ENCOUNTER — Other Ambulatory Visit: Payer: Self-pay | Admitting: *Deleted

## 2024-04-14 ENCOUNTER — Inpatient Hospital Stay
Admission: RE | Admit: 2024-04-14 | Discharge: 2024-04-14 | Disposition: A | Discharge status: Home / Self Care | Payer: Self-pay | Source: Ambulatory Visit | Attending: Nurse Practitioner

## 2024-04-14 DIAGNOSIS — Z1231 Encounter for screening mammogram for malignant neoplasm of breast: Secondary | ICD-10-CM

## 2024-04-15 DIAGNOSIS — G8929 Other chronic pain: Secondary | ICD-10-CM | POA: Diagnosis not present

## 2024-04-15 DIAGNOSIS — G894 Chronic pain syndrome: Secondary | ICD-10-CM | POA: Diagnosis not present

## 2024-04-15 DIAGNOSIS — M5431 Sciatica, right side: Secondary | ICD-10-CM | POA: Diagnosis not present

## 2024-04-15 DIAGNOSIS — M545 Low back pain, unspecified: Secondary | ICD-10-CM | POA: Diagnosis not present

## 2024-04-15 DIAGNOSIS — M25562 Pain in left knee: Secondary | ICD-10-CM | POA: Diagnosis not present

## 2024-04-15 DIAGNOSIS — M797 Fibromyalgia: Secondary | ICD-10-CM | POA: Diagnosis not present

## 2024-04-15 DIAGNOSIS — M62838 Other muscle spasm: Secondary | ICD-10-CM | POA: Diagnosis not present

## 2024-04-15 MED ORDER — OZEMPIC (2 MG/DOSE) 8 MG/3ML ~~LOC~~ SOPN: PEN_INJECTOR | SUBCUTANEOUS | 5 refills | Status: DC

## 2024-04-16 ENCOUNTER — Encounter (HOSPITAL_COMMUNITY): Payer: Self-pay

## 2024-04-16 NOTE — Progress Notes (Signed)
 Will discuss labs at upcoming appt

## 2024-04-18 DIAGNOSIS — R3129 Other microscopic hematuria: Secondary | ICD-10-CM | POA: Diagnosis not present

## 2024-04-20 LAB — UA/M W/RFLX CULTURE, ROUTINE
Bilirubin, UA: NEGATIVE
Ketones, UA: NEGATIVE
Nitrite, UA: NEGATIVE
Protein,UA: NEGATIVE
Specific Gravity, UA: 1.019 (ref 1.005–1.030)
Urobilinogen, Ur: 0.2 mg/dL (ref 0.2–1.0)
pH, UA: 6 (ref 5.0–7.5)

## 2024-04-20 LAB — MICROSCOPIC EXAMINATION
Bacteria, UA: NONE SEEN
Casts: NONE SEEN /LPF
RBC, Urine: NONE SEEN /HPF (ref 0–2)

## 2024-04-20 LAB — URINE CULTURE, REFLEX

## 2024-04-21 ENCOUNTER — Encounter: Payer: Self-pay | Admitting: Nurse Practitioner

## 2024-04-23 ENCOUNTER — Encounter

## 2024-04-29 ENCOUNTER — Encounter: Payer: Self-pay | Admitting: Nurse Practitioner

## 2024-04-29 ENCOUNTER — Ambulatory Visit: Admitting: Nurse Practitioner

## 2024-04-29 VITALS — BP 95/60 | HR 84 | Temp 97.1°F | Resp 16 | Ht 61.0 in | Wt 158.0 lb

## 2024-04-29 DIAGNOSIS — J4520 Mild intermittent asthma, uncomplicated: Secondary | ICD-10-CM

## 2024-04-29 DIAGNOSIS — R3129 Other microscopic hematuria: Secondary | ICD-10-CM

## 2024-04-29 DIAGNOSIS — N390 Urinary tract infection, site not specified: Secondary | ICD-10-CM

## 2024-04-29 DIAGNOSIS — I890 Lymphedema, not elsewhere classified: Secondary | ICD-10-CM

## 2024-04-29 DIAGNOSIS — Z79899 Other long term (current) drug therapy: Secondary | ICD-10-CM

## 2024-04-29 MED ORDER — ALBUTEROL SULFATE HFA 108 (90 BASE) MCG/ACT IN AERS
1.0000 | INHALATION_SPRAY | Freq: Four times a day (QID) | RESPIRATORY_TRACT | 1 refills | Status: DC | PRN
Start: 2024-04-29 — End: 2024-06-16

## 2024-04-29 NOTE — Progress Notes (Signed)
 [facility] [address] [place], [place] [address]  Internal MEDICINE  Office Visit Note  Patient Name: [name] [name]  [id number]  [id number]  Date of Service: 04/29/2024  Chief Complaint  Patient presents with   Diabetes   Hyperlipidemia   Follow-up    HPI [name] [name] presents for a follow-up visit for microscopic hematuria and lymphedema Microscopic hematuria -- will send urine for urine cytology. Has renal ultrasound scheduled.  Urine hesitancy, difficulty urinating. And frequent UTIs.  Lymphedema of both legs --swelling and pain in the legs. Diuretics are not effective.    Current Medication: Outpatient Encounter Medications as of 04/29/2024  Medication Sig   Accu-Chek Softclix Lancets lancets Use 1 lancet to check glucose level once daily   ALPRAZolam  (XANAX ) 0.5 MG tablet Take 1 tablet (0.5 mg total) by mouth 2 (two) times daily as needed for anxiety or sleep.   ammonium lactate  (LAC-HYDRIN ) 12 % lotion Apply 1 application topically as needed for dry skin.   atorvastatin  (LIPITOR) 10 MG tablet Take 1 tablet (10 mg total) by mouth daily.   Blood Glucose Monitoring Suppl (ACCU-CHEK GUIDE ME) w/Device KIT Use as directed. E11.65   cariprazine  (VRAYLAR ) 3 MG capsule Take 1 capsule (3 mg total) by mouth daily.   Cholecalciferol  25 MCG (1000 UT) tablet Take 1 tablet (1,000 Units total) by mouth once a week.   dapagliflozin  propanediol (FARXIGA ) 10 MG TABS tablet Take 1 tab po daily   diclofenac Sodium (VOLTAREN) 1 % GEL SMARTSIG:2-4 Gram(s) Topical 1-4 Times Daily PRN   fluticasone  (FLONASE ) 50 MCG/ACT nasal spray Place 2 sprays into both nostrils daily.   glucose blood (ACCU-CHEK GUIDE) test strip Use 1 test strip to check fasting glucose once daily and as need for diabetes   HYDROmorphone (DILAUDID) 4 MG tablet Take 4 mg by mouth 4 (four) times daily as needed.   ibuprofen  (ADVIL ) 800 MG tablet Take 1 tablet (800 mg total) by mouth every 8 (eight) hours as needed  for moderate pain (pain score 4-6).   Insulin  Pen Needle 31G X 5 MM MISC Use as directed  Once a day Dx e11.65   Ipratropium-Albuterol  (COMBIVENT  RESPIMAT) 20-100 MCG/ACT AERS respimat INHALE 1 PUFF INTO THE LUNGS EVERY 6 HOURS AS NEEDED FOR WHEEZING   levocetirizine (XYZAL ) 5 MG tablet Take 1 tablet (5 mg total) by mouth every evening.   levothyroxine  (SYNTHROID ) 50 MCG tablet Take 1 tablet (50 mcg total) by mouth daily before breakfast.   methocarbamol (ROBAXIN) 500 MG tablet PLEASE SEE ATTACHED FOR DETAILED DIRECTIONS   morphine  (MS CONTIN ) 60 MG 12 hr tablet Take 60 mg by mouth every 12 (twelve) hours.   Multiple Vitamin (MULTI-VITAMIN) tablet Take 1 tablet by mouth daily.   nystatin  (MYCOSTATIN /NYSTOP ) powder Apply 1 Application topically 3 (three) times daily.   ondansetron  (ZOFRAN -ODT) 4 MG disintegrating tablet Take 1 tablet (4 mg total) by mouth every 8 (eight) hours as needed for nausea or vomiting.   pantoprazole  (PROTONIX ) 40 MG tablet Take 1 tablet (40 mg total) by mouth daily.   pregabalin (LYRICA) 150 MG capsule Take 150 mg by mouth 3 (three) times daily.    promethazine  (PHENERGAN ) 25 MG tablet Take 1 tablet (25 mg total) by mouth every 8 (eight) hours as needed for nausea or vomiting.   rOPINIRole  (REQUIP ) 0.25 MG tablet Take 1-2 tabs at night for restless legs   Semaglutide , 2 MG/DOSE, (OZEMPIC , 2 MG/DOSE,) 8 MG/3ML SOPN INJECT 2MG  UNDER SKIN WEEKLY   traZODone  (  DESYREL ) 100 MG tablet Take 1 tablet (100 mg total) by mouth at bedtime.   Vitamin D , Ergocalciferol , (DRISDOL ) 1.25 MG (50000 UNIT) CAPS capsule Take 1 capsule (50,000 Units total) by mouth every 7 (seven) days.   [DISCONTINUED] albuterol  (VENTOLIN  HFA) 108 (90 Base) MCG/ACT inhaler Inhale 1-2 puffs into the lungs every 6 (six) hours as needed for wheezing or shortness of breath.   albuterol  (VENTOLIN  HFA) 108 (90 Base) MCG/ACT inhaler Inhale 1-2 puffs into the lungs every 6 (six) hours as needed for wheezing or shortness  of breath.   No facility-administered encounter medications on file as of 04/29/2024.    Surgical History: Past Surgical History:  Procedure Laterality Date   ABDOMINAL HYSTERECTOMY     CARPAL TUNNEL RELEASE     COLONOSCOPY WITH PROPOFOL  N/A 05/09/2023   Procedure: COLONOSCOPY WITH PROPOFOL ;  Surgeon: Luke Salaam, MD;  Location: [facility] ENDOSCOPY;  Service: Gastroenterology;  Laterality: N/A;   COLONOSCOPY WITH PROPOFOL  N/A 10/09/2023   Procedure: COLONOSCOPY WITH PROPOFOL ;  Surgeon: Urban Garden, MD;  Location: [facility] ENDO SUITE;  Service: Gastroenterology;  Laterality: N/A;  1:00PM;ASA 3, bumpt up to 12:45 per Mindy   ESOPHAGOGASTRODUODENOSCOPY (EGD) WITH PROPOFOL  N/A 05/09/2023   Procedure: ESOPHAGOGASTRODUODENOSCOPY (EGD) WITH PROPOFOL ;  Surgeon: Luke Salaam, MD;  Location: [facility] ENDOSCOPY;  Service: Gastroenterology;  Laterality: N/A;   FOOT TENDON SURGERY Right    KNEE SURGERY     SPINAL FUSION     SPINAL FUSION      Medical History: Past Medical History:  Diagnosis Date   Actinic keratitis    Allergy    Anxiety    Asthma    Basal cell carcinoma    Chronic venous insufficiency    DDD (degenerative disc disease), lumbar    Diabetes mellitus without complication (HCC)    Fibromyalgia    Fibromyalgia    Hyperlipidemia    Lymphedema    Melanoma (HCC)    Migraine    Mitral valve prolapse    Sleep apnea    Tendonitis of foot    left    Family History: Family History  Problem Relation Age of Onset   Cancer Paternal Aunt    Cancer Paternal Uncle    Diabetes Paternal Grandfather    Heart disease Paternal Grandfather     Social History   Socioeconomic History   Marital status: Divorced    Spouse name: Not on file   Number of children: Not on file   Years of education: Not on file   Highest education level: Not on file  Occupational History   Not on file  Tobacco Use   Smoking status: Never    Passive exposure: Past   Smokeless tobacco: Never  Vaping  Use   Vaping status: Never Used  Substance and Sexual Activity   Alcohol use: No   Drug use: No   Sexual activity: Not on file  Other Topics Concern   Not on file  Social History Narrative   Not on file   Social Drivers of Health   Financial Resource Strain: Not on file  Food Insecurity: No Food Insecurity (04/07/2022)   Received from [facility], [facility]   Hunger Vital Sign    Worried About Running Out of Food in the Last Year: Never true    Ran Out of Food in the Last Year: Never true  Transportation Needs: Not on file  Physical Activity: Not on file  Stress: Not on file  Social Connections: Unknown (04/09/2022)  Received from [facility], [facility]   Social Network    Social Network: Not on file  Intimate Partner Violence: Unknown (03/13/2022)   Received from [facility], [facility]   HITS    Physically Hurt: Not on file    Insult or Talk Down To: Not on file    Threaten Physical Harm: Not on file    Scream or Curse: Not on file      Review of Systems  Constitutional:  Positive for fatigue.  Respiratory:  Negative for cough, chest tightness, shortness of breath and wheezing.   Cardiovascular:  Positive for leg swelling. Negative for chest pain and palpitations.    Vital Signs: BP 95/60   Pulse 84   Temp (!) 97.1 F (36.2 C)   Resp 16   Ht 5' 1 (1.549 m)   Wt 158 lb (71.7 kg)   LMP 12/13/1991   SpO2 94%   BMI 29.85 kg/m    Physical Exam Vitals reviewed.  Constitutional:      General: She is not in acute distress.    Appearance: Normal appearance. She is not ill-appearing.  HENT:     Head: Normocephalic and atraumatic.   Eyes:     Pupils: Pupils are equal, round, and reactive to light.    Cardiovascular:     Rate and Rhythm: Normal rate and regular rhythm.  Pulmonary:     Effort: Pulmonary effort is normal. No respiratory distress.   Neurological:     Mental Status: She is alert and oriented to person, place, and time.    Psychiatric:        Mood and Affect: Mood normal.        Behavior: Behavior normal.        Assessment/Plan: 1. Microscopic hematuria (Primary) Urine sent for cytology and patient referred to urology  - Cytology, urine - Ambulatory referral to Urology  2. Lymphedema of both lower extremities Referred to vascular surgery for lymphedema - Ambulatory referral to Vascular Surgery  3. Frequent UTI Referred to urology  - Cytology, urine - Ambulatory referral to Urology  4. Mild intermittent asthma without complication Continue prn albuterol  inhaler.  - albuterol  (VENTOLIN  HFA) 108 (90 Base) MCG/ACT inhaler; Inhale 1-2 puffs into the lungs every 6 (six) hours as needed for wheezing or shortness of breath.  Dispense: 18 g; Refill: 1   General Counseling: [name] [name] verbalizes understanding of the findings of todays visit and agrees with plan of treatment. I have discussed any further diagnostic evaluation that may be needed or ordered today. We also reviewed her medications today. she has been encouraged to call the office with any questions or concerns that should arise related to todays visit.    Orders Placed This Encounter  Procedures   Ambulatory referral to Urology   Ambulatory referral to Vascular Surgery    Meds ordered this encounter  Medications   albuterol  (VENTOLIN  HFA) 108 (90 Base) MCG/ACT inhaler    Sig: Inhale 1-2 puffs into the lungs every 6 (six) hours as needed for wheezing or shortness of breath.    Dispense:  18 g    Refill:  1    For future refills, keep on file    Return in about 3 months (around 07/30/2024) for F/U, [name] [name] PCP.   Total time spent:30 Minutes Time spent includes review of chart, medications, test results, and follow up plan with the patient.   [place] Controlled Substance Database was reviewed by me.  This patient was seen  by Laurence Pons, FNP-C in collaboration with Dr. Verneta Gone as a part of collaborative care  agreement.   Rakayla Ricklefs R. Bobbi Burow, MSN, FNP-C Internal medicine

## 2024-04-30 ENCOUNTER — Ambulatory Visit
Admission: RE | Admit: 2024-04-30 | Discharge: 2024-04-30 | Disposition: A | Discharge status: Home / Self Care | Source: Ambulatory Visit | Attending: Nurse Practitioner | Admitting: Nurse Practitioner

## 2024-04-30 DIAGNOSIS — R3129 Other microscopic hematuria: Secondary | ICD-10-CM

## 2024-05-02 ENCOUNTER — Encounter: Payer: Self-pay | Admitting: Nurse Practitioner

## 2024-05-06 ENCOUNTER — Telehealth: Payer: Self-pay | Admitting: Nurse Practitioner

## 2024-05-06 NOTE — Telephone Encounter (Signed)
 Otolaryngology referral sent via [facility] to [facility]. Notified patient. Gave pt telephone # [phone]

## 2024-05-07 ENCOUNTER — Other Ambulatory Visit: Payer: Self-pay | Admitting: Medical Genetics

## 2024-05-08 ENCOUNTER — Other Ambulatory Visit (HOSPITAL_COMMUNITY): Payer: Self-pay

## 2024-05-14 ENCOUNTER — Ambulatory Visit
Admission: RE | Admit: 2024-05-14 | Discharge: 2024-05-14 | Disposition: A | Discharge status: Home / Self Care | Source: Ambulatory Visit | Attending: Nurse Practitioner | Admitting: Nurse Practitioner

## 2024-05-14 DIAGNOSIS — Z1231 Encounter for screening mammogram for malignant neoplasm of breast: Secondary | ICD-10-CM | POA: Diagnosis not present

## 2024-05-20 DIAGNOSIS — M545 Low back pain, unspecified: Secondary | ICD-10-CM | POA: Diagnosis not present

## 2024-05-20 DIAGNOSIS — M797 Fibromyalgia: Secondary | ICD-10-CM | POA: Diagnosis not present

## 2024-05-20 DIAGNOSIS — M5431 Sciatica, right side: Secondary | ICD-10-CM | POA: Diagnosis not present

## 2024-05-20 DIAGNOSIS — Z79899 Other long term (current) drug therapy: Secondary | ICD-10-CM | POA: Diagnosis not present

## 2024-05-20 DIAGNOSIS — G8929 Other chronic pain: Secondary | ICD-10-CM | POA: Diagnosis not present

## 2024-05-20 DIAGNOSIS — G894 Chronic pain syndrome: Secondary | ICD-10-CM | POA: Diagnosis not present

## 2024-05-20 DIAGNOSIS — M25562 Pain in left knee: Secondary | ICD-10-CM | POA: Diagnosis not present

## 2024-05-20 DIAGNOSIS — M62838 Other muscle spasm: Secondary | ICD-10-CM | POA: Diagnosis not present

## 2024-05-25 ENCOUNTER — Encounter: Payer: Self-pay | Admitting: Nurse Practitioner

## 2024-05-26 ENCOUNTER — Ambulatory Visit

## 2024-05-27 ENCOUNTER — Encounter (INDEPENDENT_AMBULATORY_CARE_PROVIDER_SITE_OTHER): Admitting: Vascular Surgery

## 2024-05-27 ENCOUNTER — Ambulatory Visit

## 2024-05-27 DIAGNOSIS — R3129 Other microscopic hematuria: Secondary | ICD-10-CM

## 2024-05-27 DIAGNOSIS — N390 Urinary tract infection, site not specified: Secondary | ICD-10-CM

## 2024-05-27 DIAGNOSIS — R8289 Other abnormal findings on cytological and histological examination of urine: Secondary | ICD-10-CM | POA: Diagnosis not present

## 2024-05-28 ENCOUNTER — Ambulatory Visit: Admitting: Urology

## 2024-05-28 VITALS — BP 112/76 | HR 97 | Ht 61.0 in | Wt 159.0 lb

## 2024-05-28 DIAGNOSIS — R300 Dysuria: Secondary | ICD-10-CM

## 2024-05-28 DIAGNOSIS — R3129 Other microscopic hematuria: Secondary | ICD-10-CM | POA: Diagnosis not present

## 2024-05-28 LAB — MICROSCOPIC EXAMINATION
Epithelial Cells (non renal): >10 /HPF — AB (ref 0–10)
RBC, Urine: >30 /HPF — AB (ref 0–2)

## 2024-05-28 LAB — URINALYSIS, COMPLETE
Bilirubin, UA: NEGATIVE
Ketones, UA: NEGATIVE
Leukocytes,UA: NEGATIVE
Nitrite, UA: NEGATIVE
Protein,UA: NEGATIVE
Specific Gravity, UA: 1.025 (ref 1.005–1.030)
Urobilinogen, Ur: 0.2 mg/dL (ref 0.2–1.0)
pH, UA: 6 (ref 5.0–7.5)

## 2024-05-28 NOTE — Patient Instructions (Signed)

## 2024-05-28 NOTE — Progress Notes (Signed)
 [name] [name],acting as a scribe for Rosina Riis, MD.,have documented all relevant documentation on the behalf of Rosina Riis, MD,as directed by  Rosina Riis, MD while in the presence of Rosina Riis, MD.  05/28/24 3:44 PM   [name] [name] 10-Oct-1976 [id number]  Referring provider: Liana Fish, NP [address] [place],  [place] [address]  Chief Complaint  Patient presents with   New Patient (Initial Visit)    HPI:  48 year old female presents today for further evaluation of hematuria.   She has had trace to moderate amounts of blood on her point-of-care urine dips. A microscopic exam on 04/18/2028 did not show any presence of WBCs. There was a urine cytology ordered but the results are not viewable.  A renal ultrasound ordered by her PCP showed normal kidneys bilaterally with no stones or hydronephrosis.   Today, her urine analysis shows greater than 10 epithelial cells and greater than 30 red blood cells.   She reports burning when wiping, not consistently, and sometimes experiences a sudden urge to urinate without the expected volume. She has adjusted her routine to urinate every two hours to manage this.   She also reports low back pain on the right side.   She has a history of hysterectomy with one ovary left and has been experiencing hot flashes for years. She has not had any vaginal drainage or sweats. Her hormone levels were checked in the past, indicating she was close to menopause but not close enough to treat.   She has a history of smoking a pack a day for quite a few years but quit approximately ten years ago.   PMH: Past Medical History:  Diagnosis Date   Actinic keratitis    Allergy    Anxiety    Asthma    Basal cell carcinoma    Chronic venous insufficiency    DDD (degenerative disc disease), lumbar    Diabetes mellitus without complication (HCC)    Fibromyalgia    Fibromyalgia    Hyperlipidemia    Lymphedema    Melanoma  (HCC)    Migraine    Mitral valve prolapse    Sleep apnea    Tendonitis of foot    left    Surgical History: Past Surgical History:  Procedure Laterality Date   ABDOMINAL HYSTERECTOMY     CARPAL TUNNEL RELEASE     COLONOSCOPY WITH PROPOFOL  N/A 05/09/2023   Procedure: COLONOSCOPY WITH PROPOFOL ;  Surgeon: Therisa Bi, MD;  Location: [facility] ENDOSCOPY;  Service: Gastroenterology;  Laterality: N/A;   COLONOSCOPY WITH PROPOFOL  N/A 10/09/2023   Procedure: COLONOSCOPY WITH PROPOFOL ;  Surgeon: Eartha Angelia Sieving, MD;  Location: [facility] ENDO [facility];  Service: Gastroenterology;  Laterality: N/A;  1:00PM;ASA 3, bumpt up to 12:45 per Mindy   ESOPHAGOGASTRODUODENOSCOPY (EGD) WITH PROPOFOL  N/A 05/09/2023   Procedure: ESOPHAGOGASTRODUODENOSCOPY (EGD) WITH PROPOFOL ;  Surgeon: Therisa Bi, MD;  Location: [facility] ENDOSCOPY;  Service: Gastroenterology;  Laterality: N/A;   FOOT TENDON SURGERY Right    KNEE SURGERY     SPINAL FUSION     SPINAL FUSION      Home Medications:  Allergies as of 05/28/2024       Reactions   Oxycodone Hcl Nausea And Vomiting   Pollen Extract Other (See Comments)   Congestion, sneezing   Topiramate Other (See Comments)   Jerking  Jerking  Jerking  Other reaction(s): Other (See Comments) Jerking    Bee Venom Hives, Itching, Swelling   Buspirone   Crestor [rosuvastatin] Other (See Comments)   Muscle spasms   Doxycycline Nausea Only   Mirtazapine     Oxycontin [oxycodone] Nausea Only   Wellbutrin  [bupropion ]         Medication List        Accurate as of May 28, 2024  3:44 PM. If you have any questions, ask your nurse or doctor.          Accu-Chek Guide Me w/Device Kit Use as directed. E11.65   Accu-Chek Guide test strip Generic drug: glucose blood Use 1 test strip to check fasting glucose once daily and as need for diabetes   Accu-Chek Softclix Lancets lancets Use 1 lancet to check glucose level once daily   albuterol  108 (90 Base) MCG/ACT  inhaler Commonly known as: VENTOLIN  HFA Inhale 1-2 puffs into the lungs every 6 (six) hours as needed for wheezing or shortness of breath.   ALPRAZolam  0.5 MG tablet Commonly known as: XANAX  Take 1 tablet (0.5 mg total) by mouth 2 (two) times daily as needed for anxiety or sleep.   ammonium lactate  12 % lotion Commonly known as: LAC-HYDRIN  Apply 1 application topically as needed for dry skin.   atorvastatin  10 MG tablet Commonly known as: LIPITOR Take 1 tablet (10 mg total) by mouth daily.   cariprazine  3 MG capsule Commonly known as: Vraylar  Take 1 capsule (3 mg total) by mouth daily.   Cholecalciferol  25 MCG (1000 UT) tablet Take 1 tablet (1,000 Units total) by mouth once a week.   Combivent  Respimat 20-100 MCG/ACT Aers respimat Generic drug: Ipratropium-Albuterol  INHALE 1 PUFF INTO THE LUNGS EVERY 6 HOURS AS NEEDED FOR WHEEZING   dapagliflozin  propanediol 10 MG Tabs tablet Commonly known as: Farxiga  Take 1 tab po daily   diclofenac Sodium 1 % Gel Commonly known as: VOLTAREN SMARTSIG:2-4 Gram(s) Topical 1-4 Times Daily PRN   fluticasone  50 MCG/ACT nasal spray Commonly known as: FLONASE  Place 2 sprays into both nostrils daily.   HYDROmorphone 4 MG tablet Commonly known as: DILAUDID Take 4 mg by mouth 4 (four) times daily as needed.   ibuprofen  800 MG tablet Commonly known as: ADVIL  Take 1 tablet (800 mg total) by mouth every 8 (eight) hours as needed for moderate pain (pain score 4-6).   Insulin  Pen Needle 31G X 5 MM Misc Use as directed  Once a day Dx e11.65   levocetirizine 5 MG tablet Commonly known as: XYZAL  Take 1 tablet (5 mg total) by mouth every evening.   levothyroxine  50 MCG tablet Commonly known as: SYNTHROID  Take 1 tablet (50 mcg total) by mouth daily before breakfast.   methocarbamol 500 MG tablet Commonly known as: ROBAXIN PLEASE SEE ATTACHED FOR DETAILED DIRECTIONS   morphine  60 MG 12 hr tablet Commonly known as: MS CONTIN  Take 60 mg by  mouth every 12 (twelve) hours.   Multi-Vitamin tablet Take 1 tablet by mouth daily.   nystatin  powder Commonly known as: MYCOSTATIN /NYSTOP  Apply 1 Application topically 3 (three) times daily.   ondansetron  4 MG disintegrating tablet Commonly known as: ZOFRAN -ODT Take 1 tablet (4 mg total) by mouth every 8 (eight) hours as needed for nausea or vomiting.   Ozempic  (2 MG/DOSE) 8 MG/3ML Sopn Generic drug: Semaglutide  (2 MG/DOSE) INJECT 2MG  UNDER SKIN WEEKLY   pantoprazole  40 MG tablet Commonly known as: PROTONIX  Take 1 tablet (40 mg total) by mouth daily.   pregabalin 150 MG capsule Commonly known as: LYRICA Take 150 mg by mouth 3 (three) times daily.   promethazine   25 MG tablet Commonly known as: PHENERGAN  Take 1 tablet (25 mg total) by mouth every 8 (eight) hours as needed for nausea or vomiting.   rOPINIRole  0.25 MG tablet Commonly known as: REQUIP  Take 1-2 tabs at night for restless legs   traZODone  100 MG tablet Commonly known as: DESYREL  Take 1 tablet (100 mg total) by mouth at bedtime.   Vitamin D  (Ergocalciferol ) 1.25 MG (50000 UNIT) Caps capsule Commonly known as: DRISDOL  Take 1 capsule (50,000 Units total) by mouth every 7 (seven) days.        Allergies:  Allergies  Allergen Reactions   Oxycodone Hcl Nausea And Vomiting   Pollen Extract Other (See Comments)    Congestion, sneezing   Topiramate Other (See Comments)    Jerking  Jerking  Jerking  Other reaction(s): Other (See Comments) Jerking     Bee Venom Hives, Itching and Swelling   Buspirone     Crestor [Rosuvastatin] Other (See Comments)    Muscle spasms   Doxycycline Nausea Only   Mirtazapine     Oxycontin [Oxycodone] Nausea Only   Wellbutrin  [Bupropion ]     Family History: Family History  Problem Relation Age of Onset   Cancer Paternal Aunt    Cancer Paternal Uncle    Diabetes Paternal Grandfather    Heart disease Paternal Grandfather     Social History:  reports that she has never  smoked. She has been exposed to tobacco smoke. She has never used smokeless tobacco. She reports that she does not drink alcohol and does not use drugs.   Physical Exam: BP 112/76   Pulse 97   Ht 5' 1 (1.549 m)   Wt 159 lb (72.1 kg)   LMP 12/13/1991   BMI 30.04 kg/m   Constitutional:  Alert and oriented, No acute distress. HEENT: [place] AT, moist mucus membranes.  Trachea midline, no masses. Neurologic: Grossly intact, no focal deficits, moving all 4 extremities. Psychiatric: Normal mood and affect.   Pertinent Imaging: EXAM: RENAL / URINARY TRACT ULTRASOUND COMPLETE  COMPARISON:  CT abdomen and pelvis 12/28/2022  FINDINGS: Right Kidney:  Renal measurements: 10.5 x 4.3 x 5.5 cm = volume: 129 mL.  The  Left Kidney:  Renal measurements: 10.8 x 5.1 x 4.3 cm = volume: 122 mL. Echogenicity within normal limits. No mass or hydronephrosis visualized.  Bladder:  Appears normal for degree of bladder distention.  Other:  None.  IMPRESSION: Normal renal ultrasound.   Electronically Signed By: Greig Pique M.D. On: 05/09/2024 23:40   This was personally reviewed and I agree with the radiologic interpretation.  Assessment & Plan:    1. Hematuria - Differential diagnosis includes urinary tract infection, bladder cancer, urethral irritation, or postmenopausal urethral changes. -    - The renal ultrasound was normal, ruling out nephrolithiasis and hydronephrosis.  - A cystoscopy is planned to evaluate for bladder cancer or other bladder pathology. She will be scheduled for this procedure with a partner in the practice.  2. Dysuria - Likely related to postmenopausal urethral changes or urethral caruncle.  - Consideration for topical estrogen cream to alleviate symptoms.  - Further evaluation during cystoscopy to assess for urethral abnormalities.  3. Low back pain - Currently, no specific plan discussed.  - Monitor symptoms and reassess if they persist or  worsen.  Return for cystoscopy to further evaluate hematuria and urethral symptoms.  I have reviewed the above documentation for accuracy and completeness, and I agree with the above.   Rosina Riis, MD  [facility] [address], [address] [place], [place] [address] [phone]  Addendum: Urine cytology atypical from PCP

## 2024-05-29 LAB — CYTOLOGY, URINE

## 2024-05-30 ENCOUNTER — Ambulatory Visit: Payer: Self-pay | Admitting: Nurse Practitioner

## 2024-06-02 ENCOUNTER — Telehealth: Payer: Self-pay | Admitting: Nurse Practitioner

## 2024-06-02 NOTE — Telephone Encounter (Signed)
 Per Mackey w/ [facility], referral has been closed due to patient declined -Andree

## 2024-06-13 ENCOUNTER — Other Ambulatory Visit: Payer: Self-pay | Admitting: Nurse Practitioner

## 2024-06-13 DIAGNOSIS — Z79899 Other long term (current) drug therapy: Secondary | ICD-10-CM

## 2024-06-14 ENCOUNTER — Other Ambulatory Visit: Payer: Self-pay | Admitting: Nurse Practitioner

## 2024-06-14 DIAGNOSIS — J4520 Mild intermittent asthma, uncomplicated: Secondary | ICD-10-CM

## 2024-06-17 DIAGNOSIS — Z79899 Other long term (current) drug therapy: Secondary | ICD-10-CM | POA: Diagnosis not present

## 2024-06-17 DIAGNOSIS — M25562 Pain in left knee: Secondary | ICD-10-CM | POA: Diagnosis not present

## 2024-06-17 DIAGNOSIS — M545 Low back pain, unspecified: Secondary | ICD-10-CM | POA: Diagnosis not present

## 2024-06-17 DIAGNOSIS — G894 Chronic pain syndrome: Secondary | ICD-10-CM | POA: Diagnosis not present

## 2024-06-17 DIAGNOSIS — M62838 Other muscle spasm: Secondary | ICD-10-CM | POA: Diagnosis not present

## 2024-06-17 DIAGNOSIS — M797 Fibromyalgia: Secondary | ICD-10-CM | POA: Diagnosis not present

## 2024-06-17 DIAGNOSIS — M5431 Sciatica, right side: Secondary | ICD-10-CM | POA: Diagnosis not present

## 2024-06-26 ENCOUNTER — Other Ambulatory Visit: Payer: Self-pay

## 2024-06-26 ENCOUNTER — Encounter: Payer: Self-pay | Admitting: Emergency Medicine

## 2024-06-26 ENCOUNTER — Observation Stay: Admitting: General Practice

## 2024-06-26 ENCOUNTER — Observation Stay
Admission: EM | Admit: 2024-06-26 | Discharge: 2024-06-27 | Disposition: A | Discharge status: Home / Self Care | Attending: Surgery | Admitting: Surgery

## 2024-06-26 ENCOUNTER — Encounter
Admission: EM | Disposition: A | Discharge status: Home / Self Care | Payer: Self-pay | Source: Home / Self Care | Attending: Emergency Medicine

## 2024-06-26 ENCOUNTER — Emergency Department

## 2024-06-26 DIAGNOSIS — F17220 Nicotine dependence, chewing tobacco, uncomplicated: Secondary | ICD-10-CM | POA: Diagnosis not present

## 2024-06-26 DIAGNOSIS — K3580 Unspecified acute appendicitis: Principal | ICD-10-CM | POA: Insufficient documentation

## 2024-06-26 DIAGNOSIS — R1900 Intra-abdominal and pelvic swelling, mass and lump, unspecified site: Secondary | ICD-10-CM | POA: Insufficient documentation

## 2024-06-26 DIAGNOSIS — K37 Unspecified appendicitis: Secondary | ICD-10-CM | POA: Diagnosis not present

## 2024-06-26 DIAGNOSIS — J45909 Unspecified asthma, uncomplicated: Secondary | ICD-10-CM | POA: Insufficient documentation

## 2024-06-26 DIAGNOSIS — E119 Type 2 diabetes mellitus without complications: Secondary | ICD-10-CM | POA: Diagnosis not present

## 2024-06-26 DIAGNOSIS — I700 Atherosclerosis of aorta: Secondary | ICD-10-CM | POA: Diagnosis not present

## 2024-06-26 DIAGNOSIS — I10 Essential (primary) hypertension: Secondary | ICD-10-CM | POA: Diagnosis not present

## 2024-06-26 DIAGNOSIS — Z794 Long term (current) use of insulin: Secondary | ICD-10-CM | POA: Insufficient documentation

## 2024-06-26 DIAGNOSIS — K5900 Constipation, unspecified: Secondary | ICD-10-CM | POA: Insufficient documentation

## 2024-06-26 DIAGNOSIS — R109 Unspecified abdominal pain: Secondary | ICD-10-CM | POA: Diagnosis present

## 2024-06-26 DIAGNOSIS — K3532 Acute appendicitis with perforation and localized peritonitis, without abscess: Secondary | ICD-10-CM | POA: Diagnosis not present

## 2024-06-26 DIAGNOSIS — K382 Diverticulum of appendix: Secondary | ICD-10-CM | POA: Diagnosis not present

## 2024-06-26 HISTORY — PX: XI ROBOTIC LAPAROSCOPIC ASSISTED APPENDECTOMY: SHX6877

## 2024-06-26 LAB — COMPREHENSIVE METABOLIC PANEL WITH GFR
ALT: 18 U/L (ref 0–44)
AST: 23 U/L (ref 15–41)
Albumin: 3.9 g/dL (ref 3.5–5.0)
Alkaline Phosphatase: 89 U/L (ref 38–126)
Anion gap: 10 (ref 5–15)
BUN: 17 mg/dL (ref 6–20)
CO2: 26 mmol/L (ref 22–32)
Calcium: 8.9 mg/dL (ref 8.9–10.3)
Chloride: 102 mmol/L (ref 98–111)
Creatinine, Ser: 1.1 mg/dL — ABNORMAL HIGH (ref 0.44–1.00)
GFR, Estimated: >60 mL/min (ref >60)
Glucose, Bld: 151 mg/dL — ABNORMAL HIGH (ref 70–99)
Potassium: 4.1 mmol/L (ref 3.5–5.1)
Sodium: 138 mmol/L (ref 135–145)
Total Bilirubin: 0.6 mg/dL (ref 0.0–1.2)
Total Protein: 7.1 g/dL (ref 6.5–8.1)

## 2024-06-26 LAB — URINALYSIS, ROUTINE W REFLEX MICROSCOPIC
Bacteria, UA: NONE SEEN
Bilirubin Urine: NEGATIVE
Glucose, UA: >=500 mg/dL — AB
Ketones, ur: NEGATIVE mg/dL
Nitrite: NEGATIVE
Protein, ur: NEGATIVE mg/dL
Specific Gravity, Urine: 1.031 — ABNORMAL HIGH (ref 1.005–1.030)
pH: 5 (ref 5.0–8.0)

## 2024-06-26 LAB — CBC
HCT: 43.4 % (ref 36.0–46.0)
Hemoglobin: 14.6 g/dL (ref 12.0–15.0)
MCH: 29.1 pg (ref 26.0–34.0)
MCHC: 33.6 g/dL (ref 30.0–36.0)
MCV: 86.6 fL (ref 80.0–100.0)
Platelets: 258 K/uL (ref 150–400)
RBC: 5.01 MIL/uL (ref 3.87–5.11)
RDW: 11.9 % (ref 11.5–15.5)
WBC: 6.5 K/uL (ref 4.0–10.5)
nRBC: 0 % (ref 0.0–0.2)

## 2024-06-26 LAB — LIPASE, BLOOD: Lipase: 33 U/L (ref 11–51)

## 2024-06-26 LAB — PREGNANCY, URINE: Preg Test, Ur: NEGATIVE

## 2024-06-26 SURGERY — APPENDECTOMY, ROBOT-ASSISTED, LAPAROSCOPIC
Anesthesia: General

## 2024-06-26 MED ORDER — TRAZODONE HCL 100 MG PO TABS: 100.0000 mg | ORAL_TABLET | Freq: Every day | ORAL | Status: DC

## 2024-06-26 MED ORDER — DEXAMETHASONE SODIUM PHOSPHATE 10 MG/ML IJ SOLN
INTRAMUSCULAR | Status: DC | PRN
  Administered 2024-06-26: 10 mg via INTRAVENOUS

## 2024-06-26 MED ORDER — ALPRAZOLAM 0.5 MG PO TABS
0.5000 mg | ORAL_TABLET | Freq: Two times a day (BID) | ORAL | Status: DC | PRN
  Administered 2024-06-27: 0.5 mg via ORAL
  Filled 2024-06-26: qty 1

## 2024-06-26 MED ORDER — MIDAZOLAM HCL 2 MG/2ML IJ SOLN
INTRAMUSCULAR | Status: DC | PRN
  Administered 2024-06-26: 2 mg via INTRAVENOUS

## 2024-06-26 MED ORDER — METHOCARBAMOL 500 MG PO TABS: 500.0000 mg | ORAL_TABLET | Freq: Three times a day (TID) | ORAL | Status: DC | PRN

## 2024-06-26 MED ORDER — OXYCODONE HCL 5 MG PO TABS
ORAL_TABLET | ORAL | Status: AC
  Filled 2024-06-26: qty 1

## 2024-06-26 MED ORDER — BUPIVACAINE LIPOSOME 1.3 % IJ SUSP
INTRAMUSCULAR | Status: AC
  Filled 2024-06-26: qty 20

## 2024-06-26 MED ORDER — ONDANSETRON HCL 4 MG/2ML IJ SOLN
INTRAMUSCULAR | Status: DC | PRN
  Administered 2024-06-26: 4 mg via INTRAVENOUS

## 2024-06-26 MED ORDER — GLYCOPYRROLATE 0.2 MG/ML IJ SOLN
INTRAMUSCULAR | Status: AC
Start: 2024-06-26 — End: 2024-06-26
  Filled 2024-06-26: qty 1

## 2024-06-26 MED ORDER — ALBUTEROL SULFATE (2.5 MG/3ML) 0.083% IN NEBU: 3.0000 mL | INHALATION_SOLUTION | Freq: Four times a day (QID) | RESPIRATORY_TRACT | Status: DC | PRN

## 2024-06-26 MED ORDER — LEVOTHYROXINE SODIUM 50 MCG PO TABS
50.0000 ug | ORAL_TABLET | Freq: Every day | ORAL | Status: DC
  Administered 2024-06-27: 50 ug via ORAL
  Filled 2024-06-26: qty 1

## 2024-06-26 MED ORDER — SODIUM CHLORIDE 0.9 % IV SOLN
INTRAVENOUS | Status: AC | PRN
  Administered 2024-06-26: 20 mL via INTRAMUSCULAR

## 2024-06-26 MED ORDER — DEXAMETHASONE SODIUM PHOSPHATE 10 MG/ML IJ SOLN
INTRAMUSCULAR | Status: AC
  Filled 2024-06-26: qty 1

## 2024-06-26 MED ORDER — PANTOPRAZOLE SODIUM 40 MG PO TBEC
40.0000 mg | DELAYED_RELEASE_TABLET | Freq: Every day | ORAL | Status: DC
  Administered 2024-06-26 – 2024-06-27 (×2): 40 mg via ORAL
  Filled 2024-06-26 (×2): qty 1

## 2024-06-26 MED ORDER — DAPAGLIFLOZIN PROPANEDIOL 10 MG PO TABS
10.0000 mg | ORAL_TABLET | Freq: Every day | ORAL | Status: DC
  Administered 2024-06-27: 10 mg via ORAL
  Filled 2024-06-26: qty 1

## 2024-06-26 MED ORDER — ROCURONIUM BROMIDE 100 MG/10ML IV SOLN
INTRAVENOUS | Status: DC | PRN
  Administered 2024-06-26: 50 mg via INTRAVENOUS

## 2024-06-26 MED ORDER — SODIUM CHLORIDE (PF) 0.9 % IJ SOLN
INTRAMUSCULAR | Status: AC
  Filled 2024-06-26: qty 20

## 2024-06-26 MED ORDER — KETOROLAC TROMETHAMINE 30 MG/ML IJ SOLN
INTRAMUSCULAR | Status: DC | PRN
  Administered 2024-06-26: 30 mg via INTRAVENOUS

## 2024-06-26 MED ORDER — DICLOFENAC SODIUM 1 % EX GEL: 4.0000 g | Freq: Four times a day (QID) | CUTANEOUS | Status: DC | PRN

## 2024-06-26 MED ORDER — OXYCODONE HCL 5 MG PO TABS
5.0000 mg | ORAL_TABLET | Freq: Once | ORAL | Status: AC | PRN
  Administered 2024-06-27: 5 mg via ORAL

## 2024-06-26 MED ORDER — NYSTATIN 100000 UNIT/GM EX POWD: 1.0000 | Freq: Three times a day (TID) | CUTANEOUS | Status: DC | PRN

## 2024-06-26 MED ORDER — ACETAMINOPHEN 10 MG/ML IV SOLN
INTRAVENOUS | Status: DC | PRN
  Administered 2024-06-26: 1000 mg via INTRAVENOUS

## 2024-06-26 MED ORDER — BUPIVACAINE HCL (PF) 0.5 % IJ SOLN
INTRAMUSCULAR | Status: AC
  Filled 2024-06-26: qty 30

## 2024-06-26 MED ORDER — KETOROLAC TROMETHAMINE 30 MG/ML IJ SOLN
30.0000 mg | Freq: Four times a day (QID) | INTRAMUSCULAR | Status: DC | PRN
  Administered 2024-06-27: 30 mg via INTRAVENOUS
  Filled 2024-06-26: qty 1

## 2024-06-26 MED ORDER — FENTANYL CITRATE (PF) 100 MCG/2ML IJ SOLN
25.0000 ug | INTRAMUSCULAR | Status: DC | PRN
  Administered 2024-06-27 (×2): 25 ug via INTRAVENOUS
  Administered 2024-06-27: 50 ug via INTRAVENOUS

## 2024-06-26 MED ORDER — IOHEXOL 300 MG/ML  SOLN
100.0000 mL | Freq: Once | INTRAMUSCULAR | Status: AC | PRN
  Administered 2024-06-26: 100 mL via INTRAVENOUS

## 2024-06-26 MED ORDER — HYDROMORPHONE HCL 2 MG PO TABS
4.0000 mg | ORAL_TABLET | Freq: Four times a day (QID) | ORAL | Status: DC | PRN
  Administered 2024-06-26 – 2024-06-27 (×2): 4 mg via ORAL
  Filled 2024-06-26 (×2): qty 2

## 2024-06-26 MED ORDER — LIDOCAINE-EPINEPHRINE (PF) 1 %-1:200000 IJ SOLN
INTRAMUSCULAR | Status: DC | PRN
  Administered 2024-06-26: 25 mL

## 2024-06-26 MED ORDER — KETOROLAC TROMETHAMINE 30 MG/ML IJ SOLN
INTRAMUSCULAR | Status: AC
  Filled 2024-06-26: qty 1

## 2024-06-26 MED ORDER — SUGAMMADEX SODIUM 200 MG/2ML IV SOLN
INTRAVENOUS | Status: DC | PRN
  Administered 2024-06-26: 150 mg via INTRAVENOUS

## 2024-06-26 MED ORDER — PREGABALIN 75 MG PO CAPS: 150.0000 mg | ORAL_CAPSULE | Freq: Three times a day (TID) | ORAL | Status: DC

## 2024-06-26 MED ORDER — ACETAMINOPHEN 325 MG PO TABS: 650.0000 mg | ORAL_TABLET | Freq: Three times a day (TID) | ORAL | Status: DC | PRN

## 2024-06-26 MED ORDER — ONDANSETRON HCL 4 MG/2ML IJ SOLN
4.0000 mg | Freq: Once | INTRAMUSCULAR | Status: AC
  Administered 2024-06-26: 4 mg via INTRAVENOUS
  Filled 2024-06-26: qty 2

## 2024-06-26 MED ORDER — FENTANYL CITRATE (PF) 100 MCG/2ML IJ SOLN
INTRAMUSCULAR | Status: AC
  Filled 2024-06-26: qty 2

## 2024-06-26 MED ORDER — SODIUM CHLORIDE 0.9 % IV BOLUS
500.0000 mL | Freq: Once | INTRAVENOUS | Status: AC
  Administered 2024-06-26: 500 mL via INTRAVENOUS

## 2024-06-26 MED ORDER — ACETAMINOPHEN 10 MG/ML IV SOLN
1000.0000 mg | Freq: Four times a day (QID) | INTRAVENOUS | Status: DC
  Administered 2024-06-26: 1000 mg via INTRAVENOUS
  Filled 2024-06-26: qty 100

## 2024-06-26 MED ORDER — SODIUM CHLORIDE 0.9 % IV SOLN
2.0000 g | INTRAVENOUS | Status: DC
  Administered 2024-06-26: 2 g via INTRAVENOUS
  Filled 2024-06-26: qty 20

## 2024-06-26 MED ORDER — FENTANYL CITRATE (PF) 100 MCG/2ML IJ SOLN
INTRAMUSCULAR | Status: DC | PRN
  Administered 2024-06-26 (×2): 50 ug via INTRAVENOUS

## 2024-06-26 MED ORDER — ROCURONIUM BROMIDE 10 MG/ML (PF) SYRINGE
PREFILLED_SYRINGE | INTRAVENOUS | Status: AC
  Filled 2024-06-26: qty 10

## 2024-06-26 MED ORDER — PROPOFOL 10 MG/ML IV BOLUS
INTRAVENOUS | Status: AC
  Filled 2024-06-26: qty 20

## 2024-06-26 MED ORDER — INSULIN ASPART 100 UNIT/ML IJ SOLN
0.0000 [IU] | Freq: Three times a day (TID) | INTRAMUSCULAR | Status: DC
  Administered 2024-06-27: 5 [IU] via SUBCUTANEOUS
  Administered 2024-06-27: 3 [IU] via SUBCUTANEOUS
  Filled 2024-06-26 (×2): qty 1

## 2024-06-26 MED ORDER — GLYCOPYRROLATE 0.2 MG/ML IJ SOLN
INTRAMUSCULAR | Status: DC | PRN
  Administered 2024-06-26: .2 mg via INTRAVENOUS

## 2024-06-26 MED ORDER — ATORVASTATIN CALCIUM 10 MG PO TABS
10.0000 mg | ORAL_TABLET | Freq: Every day | ORAL | Status: DC
  Administered 2024-06-27: 10 mg via ORAL
  Filled 2024-06-26: qty 1

## 2024-06-26 MED ORDER — IPRATROPIUM-ALBUTEROL 0.5-2.5 (3) MG/3ML IN SOLN: 3.0000 mL | Freq: Four times a day (QID) | RESPIRATORY_TRACT | Status: DC | PRN

## 2024-06-26 MED ORDER — ROPINIROLE HCL 0.25 MG PO TABS
0.2500 mg | ORAL_TABLET | Freq: Every day | ORAL | Status: DC
  Administered 2024-06-27: 0.25 mg via ORAL
  Filled 2024-06-26: qty 1

## 2024-06-26 MED ORDER — MIDAZOLAM HCL 2 MG/2ML IJ SOLN
INTRAMUSCULAR | Status: AC
  Filled 2024-06-26: qty 2

## 2024-06-26 MED ORDER — ACETAMINOPHEN 10 MG/ML IV SOLN
INTRAVENOUS | Status: AC
  Filled 2024-06-26: qty 100

## 2024-06-26 MED ORDER — SODIUM CHLORIDE 0.9 % IV SOLN: INTRAVENOUS | Status: DC

## 2024-06-26 MED ORDER — BUPIVACAINE LIPOSOME 1.3 % IJ SUSP
INTRAMUSCULAR | Status: DC | PRN
  Administered 2024-06-26: 20 mL

## 2024-06-26 MED ORDER — HYDROMORPHONE HCL 1 MG/ML IJ SOLN: 0.5000 mg | INTRAMUSCULAR | Status: DC | PRN

## 2024-06-26 MED ORDER — FLUTICASONE PROPIONATE 50 MCG/ACT NA SUSP
2.0000 | Freq: Every day | NASAL | Status: DC
  Administered 2024-06-27: 2 via NASAL
  Filled 2024-06-26: qty 16

## 2024-06-26 MED ORDER — MORPHINE SULFATE ER 15 MG PO TBCR
60.0000 mg | EXTENDED_RELEASE_TABLET | Freq: Two times a day (BID) | ORAL | Status: DC
  Administered 2024-06-27: 60 mg via ORAL
  Filled 2024-06-26: qty 4

## 2024-06-26 MED ORDER — PROPOFOL 10 MG/ML IV BOLUS
INTRAVENOUS | Status: DC | PRN
  Administered 2024-06-26: 150 mg via INTRAVENOUS

## 2024-06-26 MED ORDER — OXYCODONE HCL 5 MG/5ML PO SOLN: 5.0000 mg | Freq: Once | ORAL | Status: AC | PRN

## 2024-06-26 MED ORDER — CARIPRAZINE HCL 1.5 MG PO CAPS
3.0000 mg | ORAL_CAPSULE | Freq: Every day | ORAL | Status: DC
  Administered 2024-06-27: 3 mg via ORAL
  Filled 2024-06-26: qty 2

## 2024-06-26 MED ORDER — LIDOCAINE HCL (CARDIAC) PF 100 MG/5ML IV SOSY
PREFILLED_SYRINGE | INTRAVENOUS | Status: DC | PRN
  Administered 2024-06-26: 50 mg via INTRAVENOUS

## 2024-06-26 MED ORDER — BUPIVACAINE HCL (PF) 0.5 % IJ SOLN
INTRAMUSCULAR | Status: DC | PRN
  Administered 2024-06-26: 25 mL

## 2024-06-26 MED ORDER — DOCUSATE SODIUM 100 MG PO CAPS: 100.0000 mg | ORAL_CAPSULE | Freq: Two times a day (BID) | ORAL | Status: DC | PRN

## 2024-06-26 MED ORDER — SUCCINYLCHOLINE CHLORIDE 200 MG/10ML IV SOSY
PREFILLED_SYRINGE | INTRAVENOUS | Status: DC | PRN
  Administered 2024-06-26: 100 mg via INTRAVENOUS

## 2024-06-26 MED ORDER — ONDANSETRON 4 MG PO TBDP: 4.0000 mg | ORAL_TABLET | Freq: Three times a day (TID) | ORAL | Status: DC | PRN

## 2024-06-26 MED ORDER — LORATADINE 10 MG PO TABS: 10.0000 mg | ORAL_TABLET | Freq: Every day | ORAL | Status: DC

## 2024-06-26 MED ORDER — SUCCINYLCHOLINE CHLORIDE 200 MG/10ML IV SOSY
PREFILLED_SYRINGE | INTRAVENOUS | Status: AC
  Filled 2024-06-26: qty 10

## 2024-06-26 MED ORDER — ONDANSETRON HCL 4 MG/2ML IJ SOLN
INTRAMUSCULAR | Status: AC
  Filled 2024-06-26: qty 2

## 2024-06-26 MED ORDER — LIDOCAINE-EPINEPHRINE (PF) 1 %-1:200000 IJ SOLN
INTRAMUSCULAR | Status: AC
  Filled 2024-06-26: qty 30

## 2024-06-26 MED ORDER — LIDOCAINE HCL (PF) 2 % IJ SOLN
INTRAMUSCULAR | Status: AC
Start: 2024-06-26 — End: 2024-06-26
  Filled 2024-06-26: qty 5

## 2024-06-26 MED ORDER — METRONIDAZOLE 500 MG/100ML IV SOLN
500.0000 mg | Freq: Two times a day (BID) | INTRAVENOUS | Status: DC
  Administered 2024-06-26: 500 mg via INTRAVENOUS
  Filled 2024-06-26 (×2): qty 100

## 2024-06-26 MED ORDER — SODIUM CHLORIDE (PF) 0.9 % IJ SOLN
INTRAMUSCULAR | Status: AC
Start: 2024-06-26 — End: 2024-06-26
  Filled 2024-06-26: qty 20

## 2024-06-26 MED ORDER — DEXMEDETOMIDINE HCL IN NACL 80 MCG/20ML IV SOLN
INTRAVENOUS | Status: DC | PRN
  Administered 2024-06-26: 12 ug via INTRAVENOUS
  Administered 2024-06-26: 8 ug via INTRAVENOUS

## 2024-06-26 MED ORDER — LEVOCETIRIZINE DIHYDROCHLORIDE 5 MG PO TABS: 5.0000 mg | ORAL_TABLET | Freq: Every evening | ORAL | Status: DC

## 2024-06-26 MED ORDER — PROMETHAZINE HCL 25 MG PO TABS: 25.0000 mg | ORAL_TABLET | Freq: Three times a day (TID) | ORAL | Status: DC | PRN

## 2024-06-26 MED ORDER — FENTANYL CITRATE (PF) 100 MCG/2ML IJ SOLN
INTRAMUSCULAR | Status: AC
Start: 2024-06-26 — End: 2024-06-26
  Filled 2024-06-26: qty 2

## 2024-06-26 SURGICAL SUPPLY — 45 items
ANCHOR TIS RET SYS 235ML (MISCELLANEOUS) ×1 IMPLANT
BAG PRESSURE INF REUSE 1000 (BAG) IMPLANT
COVER TIP SHEARS 8 DVNC (MISCELLANEOUS) ×1 IMPLANT
COVER WAND RF STERILE (DRAPES) ×1 IMPLANT
DEFOGGER SCOPE WARM SEASHARP (MISCELLANEOUS) ×1 IMPLANT
DERMABOND ADVANCED .7 DNX12 (GAUZE/BANDAGES/DRESSINGS) ×1 IMPLANT
DRAIN CHANNEL JP 15F RND 3/16 (MISCELLANEOUS) IMPLANT
DRAPE ARM DVNC X/XI (DISPOSABLE) ×3 IMPLANT
DRAPE COLUMN DVNC XI (DISPOSABLE) ×1 IMPLANT
ELECTRODE REM PT RTRN 9FT ADLT (ELECTROSURGICAL) ×1 IMPLANT
EVACUATOR DRAINAGE 7X20 100CC (MISCELLANEOUS) IMPLANT
EVACUATOR SILICONE 100CC (DRAIN) IMPLANT
FORCEPS BPLR FENES DVNC XI (FORCEP) ×1 IMPLANT
GLOVE BIOGEL PI IND STRL 7.0 (GLOVE) ×2 IMPLANT
GLOVE SURG SYN 6.5 PF PI (GLOVE) ×4 IMPLANT
GOWN STRL REUS W/ TWL LRG LVL3 (GOWN DISPOSABLE) ×4 IMPLANT
GRASPER SUT TROCAR 14GX15 (MISCELLANEOUS) IMPLANT
IRRIGATOR SUCT 8 DISP DVNC XI (IRRIGATION / IRRIGATOR) IMPLANT
IV NS 1000ML BAXH (IV SOLUTION) IMPLANT
KIT TURNOVER KIT A (KITS) ×1 IMPLANT
LABEL OR SOLS (LABEL) IMPLANT
MANIFOLD NEPTUNE II (INSTRUMENTS) ×1 IMPLANT
NDL HYPO 22X1.5 SAFETY MO (MISCELLANEOUS) ×1 IMPLANT
NDL INSUFFLATION 14GA 120MM (NEEDLE) ×1 IMPLANT
NEEDLE HYPO 22X1.5 SAFETY MO (MISCELLANEOUS) ×1 IMPLANT
NEEDLE INSUFFLATION 14GA 120MM (NEEDLE) ×1 IMPLANT
OBTURATOR OPTICALSTD 8 DVNC (TROCAR) ×1 IMPLANT
PACK LAP CHOLECYSTECTOMY (MISCELLANEOUS) ×1 IMPLANT
RELOAD STAPLE 45 2.5 WHT DVNC (STAPLE) IMPLANT
RELOAD STAPLE 45 3.5 BLU DVNC (STAPLE) IMPLANT
SCISSORS MNPLR CVD DVNC XI (INSTRUMENTS) ×1 IMPLANT
SEAL UNIV 5-12 XI (MISCELLANEOUS) ×3 IMPLANT
SEALER VESSEL EXT DVNC XI (MISCELLANEOUS) IMPLANT
SET TUBE SMOKE EVAC HIGH FLOW (TUBING) ×1 IMPLANT
SOLUTION ELECTROSURG ANTI STCK (MISCELLANEOUS) ×1 IMPLANT
STAPLER 45 SUREFORM DVNC (STAPLE) IMPLANT
SUT MNCRL AB 4-0 PS2 18 (SUTURE) ×1 IMPLANT
SUT VIC AB 3-0 SH 27X BRD (SUTURE) IMPLANT
SUT VICRYL 0 UR6 27IN ABS (SUTURE) ×1 IMPLANT
SUTURE EHLN 3-0 FS-10 30 BLK (SUTURE) IMPLANT
SYR 30ML LL (SYRINGE) ×1 IMPLANT
SYSTEM WECK SHIELD CLOSURE (TROCAR) IMPLANT
TRAP FLUID SMOKE EVACUATOR (MISCELLANEOUS) ×1 IMPLANT
TRAY FOLEY MTR SLVR 16FR STAT (SET/KITS/TRAYS/PACK) ×1 IMPLANT
WATER STERILE IRR 500ML POUR (IV SOLUTION) ×1 IMPLANT

## 2024-06-26 NOTE — ED Notes (Signed)
 Pt being transported to pacu at this time.

## 2024-06-26 NOTE — Anesthesia Preprocedure Evaluation (Signed)
 Anesthesia Evaluation  Patient identified by MRN, date of birth, ID band Patient awake    Reviewed: Allergy & Precautions, H&P , NPO status , Patient's Chart, lab work & pertinent test results, reviewed documented beta blocker date and time   Airway Mallampati: II  TM Distance: >3 FB Neck ROM: full    Dental no notable dental hx. (+) Chipped   Pulmonary neg pulmonary ROS, asthma , sleep apnea , COPD   Pulmonary exam normal breath sounds clear to auscultation       Cardiovascular Exercise Tolerance: Good hypertension, negative cardio ROS Normal cardiovascular exam Rhythm:regular Rate:Normal     Neuro/Psych  Headaches PSYCHIATRIC DISORDERS Anxiety      Neuromuscular disease negative neurological ROS  negative psych ROS   GI/Hepatic negative GI ROS, Neg liver ROS,,,  Endo/Other  negative endocrine ROSdiabetes    Renal/GU      Musculoskeletal   Abdominal   Peds  Hematology negative hematology ROS (+)   Anesthesia Other Findings Patient took her ozempic  yesterday. Will plan on RSI.   Past Medical History: No date: Actinic keratitis No date: Allergy No date: Anxiety No date: Asthma No date: Basal cell carcinoma No date: Chronic venous insufficiency No date: DDD (degenerative disc disease), lumbar No date: Diabetes mellitus without complication (HCC) No date: Fibromyalgia No date: Fibromyalgia No date: Hyperlipidemia No date: Lymphedema No date: Melanoma (HCC) No date: Migraine No date: Mitral valve prolapse No date: Sleep apnea No date: Tendonitis of foot     Comment:  left  Past Surgical History: No date: ABDOMINAL HYSTERECTOMY No date: CARPAL TUNNEL RELEASE 05/09/2023: COLONOSCOPY WITH PROPOFOL ; N/A     Comment:  Procedure: COLONOSCOPY WITH PROPOFOL ;  Surgeon: Therisa Bi, MD;  Location: [facility] ENDOSCOPY;  Service:               Gastroenterology;  Laterality: N/A; 10/09/2023: COLONOSCOPY  WITH PROPOFOL ; N/A     Comment:  Procedure: COLONOSCOPY WITH PROPOFOL ;  Surgeon:               Eartha Flavors, Toribio, MD;  [facility]: [facility];               Service: Gastroenterology;  Laterality: N/A;  1:00PM;ASA               3, bumpt up to 12:45 per Mindy 05/09/2023: ESOPHAGOGASTRODUODENOSCOPY (EGD) WITH PROPOFOL ; N/A     Comment:  Procedure: ESOPHAGOGASTRODUODENOSCOPY (EGD) WITH               PROPOFOL ;  Surgeon: Therisa Bi, MD;  Location: [facility]               ENDOSCOPY;  Service: Gastroenterology;  Laterality: N/A; No date: FOOT TENDON SURGERY; Right No date: KNEE SURGERY No date: SPINAL FUSION No date: SPINAL FUSION  BMI    Body Mass Index: 28.34 kg/m      Reproductive/Obstetrics negative OB ROS                              Anesthesia Physical Anesthesia Plan  ASA: 3  Anesthesia Plan: General ETT   Post-op Pain Management:    Induction: Intravenous and Rapid sequence  PONV Risk Score and Plan: 4 or greater and Ondansetron , Dexamethasone  and Midazolam   Airway Management Planned: Oral ETT  Additional Equipment:   Intra-op Plan:   Post-operative Plan: Extubation in  OR  Informed Consent: I have reviewed the patients History and Physical, chart, labs and discussed the procedure including the risks, benefits and alternatives for the proposed anesthesia with the patient or authorized representative who has indicated his/her understanding and acceptance.     Dental Advisory Given  Plan Discussed with: Anesthesiologist, CRNA and Surgeon  Anesthesia Plan Comments: (Patient consented for risks of anesthesia including but not limited to:  - adverse reactions to medications - damage to eyes, teeth, lips or other oral mucosa - nerve damage due to positioning  - sore throat or hoarseness - Damage to heart, brain, nerves, lungs, other parts of body or loss of life  Patient voiced understanding and assent.)        Anesthesia Quick  Evaluation

## 2024-06-26 NOTE — H&P (Signed)
 Subjective:   CC: acute appendicitis  HPI:  [name] [name] is a 48 y.o. female who is consulted by [facility] for evaluation of  above cc.  Symptoms were first noted 2 days ago. Pain is sharp, lower moving toward RLQ.  Associated with nausea, exacerbated by nothing specific.     Past Medical History:  has a past medical history of Actinic keratitis, Allergy, Anxiety, Asthma, Basal cell carcinoma, Chronic venous insufficiency, DDD (degenerative disc disease), lumbar, Diabetes mellitus without complication (HCC), Fibromyalgia, Fibromyalgia, Hyperlipidemia, Lymphedema, Melanoma (HCC), Migraine, Mitral valve prolapse, Sleep apnea, and Tendonitis of foot.  Past Surgical History:  has a past surgical history that includes Spinal fusion; Abdominal hysterectomy; Spinal fusion; Knee surgery; Carpal tunnel release; Foot Tendon Surgery (Right); Colonoscopy with propofol  (N/A, 05/09/2023); Esophagogastroduodenoscopy (egd) with propofol  (N/A, 05/09/2023); and Colonoscopy with propofol  (N/A, 10/09/2023).  Family History: family history includes Cancer in her paternal aunt and paternal uncle; Diabetes in her paternal grandfather; Heart disease in her paternal grandfather.  Social History:  reports that she has never smoked. She has been exposed to tobacco smoke. She has never used smokeless tobacco. She reports that she does not drink alcohol and does not use drugs.  Current Medications:  Prior to Admission medications   Medication Sig Start Date End Date Taking? Authorizing Provider  Accu-Chek Softclix Lancets lancets Use 1 lancet to check glucose level once daily 06/05/23   Liana Fish, NP  albuterol  (VENTOLIN  HFA) 108 (90 Base) MCG/ACT inhaler INHALE 1 TO 2 PUFFS INTO THE LUNGS EVERY 6 HOURS AS NEEDED FOR WHEEZING OR SHORTNESS OF BREATH 06/16/24   Liana Fish, NP  ALPRAZolam  (XANAX ) 0.5 MG tablet Take 1 tablet (0.5 mg total) by mouth 2 (two) times daily as needed for anxiety or sleep. 03/31/24    Liana Fish, NP  ammonium lactate  (LAC-HYDRIN ) 12 % lotion Apply 1 application topically as needed for dry skin. 07/06/20   Scarboro, Adam J, NP  atorvastatin  (LIPITOR) 10 MG tablet Take 1 tablet (10 mg total) by mouth daily. 03/31/24   Liana Fish, NP  Blood Glucose Monitoring Suppl (ACCU-CHEK GUIDE ME) w/Device KIT Use as directed. E11.65 06/05/23   Liana Fish, NP  cariprazine  (VRAYLAR ) 3 MG capsule Take 1 capsule (3 mg total) by mouth daily. 03/31/24   Liana Fish, NP  Cholecalciferol  25 MCG (1000 UT) tablet Take 1 tablet (1,000 Units total) by mouth once a week. 01/17/21   McDonough, Lauren K, PA-C  dapagliflozin  propanediol (FARXIGA ) 10 MG TABS tablet Take 1 tab po daily 03/31/24   Abernathy, Alyssa, NP  diclofenac  Sodium (VOLTAREN ) 1 % GEL SMARTSIG:2-4 Gram(s) Topical 1-4 Times Daily PRN 08/29/21   [provider]  fluticasone  (FLONASE ) 50 MCG/ACT nasal spray Place 2 sprays into both nostrils daily. 10/30/22   Vivienne Delon HERO, PA-C  glucose blood (ACCU-CHEK GUIDE) test strip Use 1 test strip to check fasting glucose once daily and as need for diabetes 06/05/23   Liana Fish, NP  HYDROmorphone  (DILAUDID ) 4 MG tablet Take 4 mg by mouth 4 (four) times daily as needed. 07/10/22   [provider]  ibuprofen  (ADVIL ) 800 MG tablet Take 1 tablet (800 mg total) by mouth every 8 (eight) hours as needed for moderate pain (pain score 4-6). 03/31/24   Abernathy, Alyssa, NP  Insulin  Pen Needle 31G X 5 MM MISC Use as directed  Once a day Dx e11.65 01/30/22   Liana Fish, NP  Ipratropium-Albuterol  (COMBIVENT  RESPIMAT) 20-100 MCG/ACT AERS respimat INHALE 1 PUFF INTO  THE LUNGS EVERY 6 HOURS AS NEEDED FOR WHEEZING 03/31/24   Abernathy, Alyssa, NP  levocetirizine (XYZAL ) 5 MG tablet Take 1 tablet (5 mg total) by mouth every evening. 03/31/24   Abernathy, Alyssa, NP  levothyroxine  (SYNTHROID ) 50 MCG tablet Take 1 tablet (50 mcg total) by mouth daily before breakfast.  03/31/24   Liana Fish, NP  methocarbamol  (ROBAXIN ) 500 MG tablet PLEASE SEE ATTACHED FOR DETAILED DIRECTIONS 09/11/21   [provider]  morphine  (MS CONTIN ) 60 MG 12 hr tablet Take 60 mg by mouth every 12 (twelve) hours.    [provider]  Multiple Vitamin (MULTI-VITAMIN) tablet Take 1 tablet by mouth daily.    [provider]  nystatin  (MYCOSTATIN /NYSTOP ) powder Apply 1 Application topically 3 (three) times daily. 06/05/23   Liana Fish, NP  ondansetron  (ZOFRAN -ODT) 4 MG disintegrating tablet Take 1 tablet (4 mg total) by mouth every 8 (eight) hours as needed for nausea or vomiting. 11/28/23   Liana Fish, NP  pantoprazole  (PROTONIX ) 40 MG tablet Take 1 tablet (40 mg total) by mouth daily. 03/31/24   Abernathy, Alyssa, NP  pregabalin  (LYRICA ) 150 MG capsule Take 150 mg by mouth 3 (three) times daily.     [provider]  promethazine  (PHENERGAN ) 25 MG tablet TAKE 1 TABLET(25 MG) BY MOUTH EVERY 8 HOURS AS NEEDED FOR NAUSEA OR VOMITING 06/16/24   Abernathy, Alyssa, NP  rOPINIRole  (REQUIP ) 0.25 MG tablet Take 1-2 tabs at night for restless legs 06/05/23   Liana Fish, NP  Semaglutide , 2 MG/DOSE, (OZEMPIC , 2 MG/DOSE,) 8 MG/3ML SOPN INJECT 2MG  UNDER SKIN WEEKLY 04/15/24   Liana Fish, NP  traZODone  (DESYREL ) 100 MG tablet Take 1 tablet (100 mg total) by mouth at bedtime. 03/31/24   Liana Fish, NP  Vitamin D , Ergocalciferol , (DRISDOL ) 1.25 MG (50000 UNIT) CAPS capsule Take 1 capsule (50,000 Units total) by mouth every 7 (seven) days. 07/31/22   Liana Fish, NP    Allergies:  Allergies as of 06/26/2024 - Review Complete 06/26/2024  Allergen Reaction Noted   Oxycodone  hcl Nausea And Vomiting 09/21/2014   Pollen extract Other (See Comments) 04/06/2015   Topiramate Other (See Comments) 01/17/2017   Bee venom Hives, Itching, and Swelling 09/26/2014   Buspirone   08/28/2022   Crestor [rosuvastatin] Other (See Comments) 09/26/2014    Doxycycline Nausea Only 09/26/2014   Mirtazapine   08/28/2022   Oxycontin  [oxycodone ] Nausea Only 09/26/2014   Wellbutrin  [bupropion ]  10/24/2021    ROS:  Pertinent negative and positives noted in HPI   Objective:     BP 103/69 (BP Location: Left Arm)   Pulse (!) 103   Temp 98.1 F (36.7 C) (Oral)   Resp 17   Ht 5' 1 (1.549 m)   Wt 68 kg   LMP 12/13/1991   SpO2 98%   BMI 28.34 kg/m    Constitutional :  alert, cooperative, appears stated age, and no distress  Respiratory:  Clear to auscultation bilaterally  Cardiovascular:  Regular rate and rhythm  Gastrointestinal: Soft, no guarding, RLQ TTP.   Skin: Cool and moist  Psychiatric: Normal affect, non-agitated, not confused       LABS:     Latest Ref Rng & Units 06/26/2024    3:17 PM 04/02/2024   11:36 AM 10/05/2023    8:44 AM  CMP  Glucose 70 - 99 mg/dL 848  871  814   BUN 6 - 20 mg/dL 17  14  14    Creatinine 0.44 - 1.00 mg/dL 8.89  0.97  0.81   Sodium 135 - 145 mmol/L 138  140  138   Potassium 3.5 - 5.1 mmol/L 4.1  4.4  3.4   Chloride 98 - 111 mmol/L 102  101  101   CO2 22 - 32 mmol/L 26  24  24    Calcium  8.9 - 10.3 mg/dL 8.9  9.0  8.7   Total Protein 6.5 - 8.1 g/dL 7.1  6.4    Total Bilirubin 0.0 - 1.2 mg/dL 0.6  0.3    Alkaline Phos 38 - 126 U/L 89  108    AST 15 - 41 U/L 23  15    ALT 0 - 44 U/L 18  14        Latest Ref Rng & Units 06/26/2024    3:17 PM 04/02/2024   11:36 AM 03/22/2023   10:19 AM  CBC  WBC 4.0 - 10.5 K/uL 6.5  5.2  5.1   Hemoglobin 12.0 - 15.0 g/dL 85.3  86.0  85.7   Hematocrit 36.0 - 46.0 % 43.4  43.4  42.6   Platelets 150 - 400 K/uL 258  249  256      RADS: CLINICAL DATA:  Right lower quadrant pain skip CT abdomen and pelvis 12/28/2022.   EXAM: CT ABDOMEN AND PELVIS WITH CONTRAST   TECHNIQUE: Multidetector CT imaging of the abdomen and pelvis was performed using the standard protocol following bolus administration of intravenous contrast.   RADIATION DOSE REDUCTION: This  exam was performed according to the departmental dose-optimization program which includes automated exposure control, adjustment of the mA and/or kV according to patient size and/or use of iterative reconstruction technique.   CONTRAST:  OMNIPAQUE  IOHEXOL  300 MG/ML  SOLN   COMPARISON:  Choose 1   FINDINGS: Lower chest: No acute abnormality.   Hepatobiliary: No focal liver abnormality is seen. No gallstones, gallbladder wall thickening, or biliary dilatation.   Pancreas: Unremarkable. No pancreatic ductal dilatation or surrounding inflammatory changes.   Spleen: Normal in size without focal abnormality.   Adrenals/Urinary Tract: Adrenal glands are unremarkable. Kidneys are normal, without renal calculi, focal lesion, or hydronephrosis. Bladder is unremarkable.   Stomach/Bowel: The appendix is dilated measuring up to 12 mm. There is wall thickening and surrounding inflammation. There is no fluid collection or free air identified. There is no evidence for bowel obstruction. There is a large amount of stool throughout the colon   Vascular/Lymphatic: Aortic atherosclerosis. No enlarged abdominal or pelvic lymph nodes.   Reproductive: Uterus is smaller absent. Adnexa are within normal limits.   The   Other:There is trace free fluid in the pelvis. There is mild subcutaneous stranding under the skin surface in the lower right anterior abdominal wall.   Musculoskeletal: Lower thoracic spinal fusion hardware present.   IMPRESSION: 1. Findings compatible with acute appendicitis. No fluid collection or free air. 2. Trace free fluid in the pelvis. 3. Constipation.   Aortic Atherosclerosis (ICD10-I70.0).     Electronically Signed   By: Greig Pique M.D.   On: 06/26/2024 18:54 Assessment:      Acute appendicitis  Plan:      Discussed the risk of surgery including post-op infxn, seroma, hematoma, abscess formation, chronic pain, poor-delayed wound healing,  possible bowel resection, possible ostomy, possible conversion to open procedure, post-op SBO or ileus, and need for additional procedures to address said risks.  The risks of general anesthetic including MI, CVA, sudden death or even reaction to anesthetic medications also discussed. Alternatives include  continued observation, or antibiotic treatment.  Benefits include possible symptom relief,   Typical post operative recovery of 3-5 days rest, also discussed.  The patient understands the risks, any and all questions were answered to the patient's satisfaction.  To OR for robotic lap appy. Abx, NPO in the meantime  labs/images/medications/previous chart entries reviewed personally and relevant changes/updates noted above.

## 2024-06-26 NOTE — Op Note (Signed)
 Preoperative diagnosis: acute appendicitis  Postoperative diagnosis: Same  Procedure: Robotic assisted laparoscopic appendectomy.  Anesthesia: GETA  Surgeon: Henriette Pierre  Wound Classification: clean contaminated  Specimen: Appendix  Complications: None  Estimated Blood Loss: 3 mL   Indications: Patient is a 48 y.o. female  presented with above.  Please see H&P for further details.    FIndings: 1.  Irritated appendix  2. No peri-appendiceal abscess or phlegmon 3. Normal anatomy 4. Appendiceal artery ligated and divided with stapler 5. Adequate hemostasis.   Description of procedure: The patient was placed on the operating table in the supine position, left arm tucked. General anesthesia was induced. A time-out was completed verifying correct patient, procedure, site, positioning, and implant(s) and/or special equipment prior to beginning this procedure. The abdomen was prepped and draped in the usual sterile fashion.   Palmer's point located and Veress needle was inserted.  After confirming 2 clicks and a positive saline drop test, gas insufflation was initiated until the abdominal pressure was measured at 15 mmHg.  Afterwards, the Veress needle was removed and a 8 mm port was placed through a periumbilical site using Optiview technique after incision with an 11 blade.  After local was infused, 2 additional incision was made 8 cm apart each side along the left side of the abdominal wall from the initial incision.  An 8 mm port caudad and 12mm port cephalad from initial incision, both under direct visualization.  No injuries from trocar placements were noted. The table was placed in the Trendelenburg position with the right side elevated.  Xi robotic platform was then brought to the operative field and docked.  An inflamed appendix was identified and elevated.  Infection was present within the abdominal cavity due to appendicitis. Window created at base of appendix in the mesentery.    A blue load linear cutting stapler was then used to divide and staple the base of the appendix. It was reloaded with a vascular cartridge and the mesoappendix similarly divided.  No bleeding from the staple lines noted.  The appendix was placed in an endoscopic retrieval bag and removed.   The appendiceal stump and mesoappendix staple line examined again, scissor tip used to cauterize bleeding portions of staple line and hemostasis noted. No other pathology was identified within pelvis. The 12 mm trocar removed and port site closed with PMI using 0 vicryl under direct vision. Remaining trocars were removed under direct vision. No bleeding was noted.The abdomen was allowed to collapse. All skin incisions then closed with subcuticular sutures Monocryl 4-0. Exparel  injected at end more around the left abdomen for further anesthesia due to chronic opioid use.  Wounds then dressed with dermabond.  The patient tolerated the procedure well, awakened from anesthesia and was taken to the postanesthesia care unit in satisfactory condition.  Sponge count and instrument count correct at the end of the procedure.

## 2024-06-26 NOTE — Anesthesia Procedure Notes (Signed)
 Procedure Name: Intubation Date/Time: 06/26/2024 10:38 PM  Performed by: Leontine Katz, CRNAPre-anesthesia Checklist: Patient identified, Patient being monitored, Timeout performed, Emergency Drugs available and Suction available Patient Re-evaluated:Patient Re-evaluated prior to induction Oxygen Delivery Method: Circle system utilized Preoxygenation: Pre-oxygenation with 100% oxygen Induction Type: IV induction and Rapid sequence Laryngoscope Size: 3 and McGrath Grade View: Grade I Tube type: Oral Tube size: 7.0 mm Number of attempts: 1 Airway Equipment and Method: Stylet and Video-laryngoscopy Placement Confirmation: ETT inserted through vocal cords under direct vision, positive ETCO2 and breath sounds checked- equal and bilateral Secured at: 21 cm Tube secured with: Tape Dental Injury: Teeth and Oropharynx as per pre-operative assessment

## 2024-06-26 NOTE — ED Provider Notes (Signed)
 [facility] Provider Note    Event Date/Time   First MD Initiated Contact with Patient 06/26/24 1702     (approximate)   History   Abdominal Pain   HPI  [name] [name] is a 48 y.o. female with a history of hyperlipidemia, fibromyalgia, migraine, asthma, diabetes, and anxiety who presents with right lower quadrant abdominal pain for the last 2 days.  The patient states initially the pain was somewhat in the right lower quadrant, then moved to the periumbilical area, but today has become more severe in intensity and focused on the right lower quadrant.  She reports nausea but no vomiting.  She has not had any diarrhea.  She denies any vaginal bleeding or discharge.  She has no dysuria or hematuria.  She has not had any prior abdominal surgeries.  I reviewed the past medical records.  The patient's most recent outpatient encounter was with Dr. Penne from urology on 6/18 for hematuria.   Physical Exam   Triage Vital Signs: ED Triage Vitals  Encounter Vitals Group     BP 06/26/24 1514 103/69     Girls Systolic BP Percentile --      Girls Diastolic BP Percentile --      Boys Systolic BP Percentile --      Boys Diastolic BP Percentile --      Pulse Rate 06/26/24 1514 (!) 103     Resp 06/26/24 1514 17     Temp 06/26/24 1514 98.1 F (36.7 C)     Temp Source 06/26/24 1514 Oral     SpO2 06/26/24 1514 98 %     Weight 06/26/24 1515 150 lb (68 kg)     Height 06/26/24 1515 5' 1 (1.549 m)     Head Circumference --      Peak Flow --      Pain Score 06/26/24 1515 10     Pain Loc --      Pain Education --      Exclude from Growth Chart --     Most recent vital signs: Vitals:   06/26/24 1514  BP: 103/69  Pulse: (!) 103  Resp: 17  Temp: 98.1 F (36.7 C)  SpO2: 98%     General: Awake, no distress.  CV:  Good peripheral perfusion.  Resp:  Normal effort.  Abd:  Right lower quadrant tenderness.  No distention.  Other:  No jaundice or scleral  icterus.   ED Results / Procedures / Treatments   Labs (all labs ordered are listed, but only abnormal results are displayed) Labs Reviewed  COMPREHENSIVE METABOLIC PANEL WITH GFR - Abnormal; Notable for the following components:      Result Value   Glucose, Bld 151 (*)    Creatinine, Ser 1.10 (*)    All other components within normal limits  URINALYSIS, ROUTINE W REFLEX MICROSCOPIC - Abnormal; Notable for the following components:   Color, Urine YELLOW (*)    APPearance HAZY (*)    Specific Gravity, Urine 1.031 (*)    Glucose, UA >=500 (*)    Hgb urine dipstick MODERATE (*)    Leukocytes,Ua SMALL (*)    All other components within normal limits  LIPASE, BLOOD  CBC  PREGNANCY, URINE     EKG    RADIOLOGY  CT abdomen/pelvis: I independently viewed and interpreted the images; there is stool in the large intestine.  There are no dilated bowel loops or any free air.  Radiology report indicates acute appendicitis.  PROCEDURES:  Critical Care performed: No  Procedures   MEDICATIONS ORDERED IN ED: Medications  acetaminophen  (OFIRMEV ) IV 1,000 mg (1,000 mg Intravenous New Bag/Given 06/26/24 1906)  sodium chloride  0.9 % bolus 500 mL (0 mLs Intravenous Stopped 06/26/24 1929)  ondansetron  (ZOFRAN ) injection 4 mg (4 mg Intravenous Given 06/26/24 1824)  iohexol  (OMNIPAQUE ) 300 MG/ML solution 100 mL (100 mLs Intravenous Contrast Given 06/26/24 1759)     IMPRESSION / MDM / ASSESSMENT AND PLAN / ED COURSE  I reviewed the triage vital signs and the nursing notes.  48 year old female with PMH as noted above presents with right lower quadrant pain for the last 2 days initially somewhat vague in location but now more focal.  She is borderline tachycardic with otherwise normal vital signs.  On abdominal exam she is tender in the right lower quadrant.  Differential diagnosis includes, but is not limited to, acute appendicitis, mesenteric adenitis, colitis, diverticulitis, ovarian cyst  rupture.  There is no clinical evidence for ovarian torsion, PID, or other acute gynecologic etiology.  CMP shows no acute findings.  CBC shows no leukocytosis.  We will obtain urinalysis, CT abdomen/pelvis, and reassess.  Patient's presentation is most consistent with acute complicated illness / injury requiring diagnostic workup.  ----------------------------------------- 7:12 PM on 06/26/2024 -----------------------------------------  The CT is positive for findings of acute appendicitis.  I consulted and discussed the case with Dr. Tye who is going to evaluate the patient.  ----------------------------------------- 8:12 PM on 06/26/2024 -----------------------------------------  Dr. Tye is evaluating the patient.  Disposition is pending.  The patient will be signed out to the oncoming ED providers.   FINAL CLINICAL IMPRESSION(S) / ED DIAGNOSES   Final diagnoses:  Acute appendicitis, unspecified acute appendicitis type     Rx / DC Orders   ED Discharge Orders     None        Note:  This document was prepared using Dragon voice recognition software and may include unintentional dictation errors.    Jacolyn Pae, MD 06/26/24 2012

## 2024-06-26 NOTE — ED Notes (Signed)
 Pt report given to [facility] Rn in PACU

## 2024-06-26 NOTE — Transfer of Care (Signed)
 Immediate Anesthesia Transfer of Care Note  Patient: [name] [name]  Procedure(s) Performed: APPENDECTOMY, ROBOT-ASSISTED, LAPAROSCOPIC  Patient Location: PACU  Anesthesia Type:General  Level of Consciousness: awake  Airway & Oxygen Therapy: Patient Spontanous Breathing and Patient connected to face mask oxygen  Post-op Assessment: Report given to RN and Post -op Vital signs reviewed and stable  Post vital signs: Reviewed  Last Vitals:  Vitals Value Taken Time  BP 130/78   Temp    Pulse 94   Resp 12   SpO2 97     Last Pain:         Complications: No notable events documented.

## 2024-06-26 NOTE — ED Triage Notes (Signed)
 Patient to ED via POV for RLQ abd pain with nausea. Started a few days ago. Worse with movement.

## 2024-06-26 NOTE — ED Provider Triage Note (Signed)
 Emergency Medicine Provider Triage Evaluation Note  [name] [name] , a 48 y.o. female  was evaluated in triage.  Pt complains of abdominal pain.  According to the patient symptoms started today with abdominal pain that is started in the epigastric area and now is localized in the right lower quadrant, nauseas.  Patient denies fever, vomit.  Patient denies appendicectomy.  Patient states movement increases the abdominal pain.  Last food intake was today at noon  Review of Systems  Positive:  Negative:   Physical Exam  BP 103/69 (BP Location: Left Arm)   Pulse (!) 103   Temp 98.1 F (36.7 C) (Oral)   Resp 17   Ht 5' 1 (1.549 m)   Wt 68 kg   LMP 12/13/1991   SpO2 98%   BMI 28.34 kg/m  Gen:   Awake, no distress during triage patient is tachycardic Resp:  Normal effort  MSK:   Moves extremities without difficulty  Other:  Abdomen: McBurney's point positive, Rovsing positive  Medical Decision Making  Medically screening exam initiated at 3:15 PM.  Appropriate orders placed.  ABRAR KOONE was informed that the remainder of the evaluation will be completed by another provider, this initial triage assessment does not replace that evaluation, and the importance of remaining in the ED until their evaluation is complete. Patient who presents with abdominal pain starting the epigastric area and migrated to the right lower quadrant, nauseas.  Patient states movement increases her abdominal pain.  Patient with possible appendicitis, ordered CBC CMP n.p.o. UA   Janit Kast, PA-C 06/26/24 1519

## 2024-06-27 ENCOUNTER — Encounter: Payer: Self-pay | Admitting: Surgery

## 2024-06-27 LAB — BASIC METABOLIC PANEL WITH GFR
Anion gap: 11 (ref 5–15)
BUN: 19 mg/dL (ref 6–20)
CO2: 24 mmol/L (ref 22–32)
Calcium: 8.5 mg/dL — ABNORMAL LOW (ref 8.9–10.3)
Chloride: 101 mmol/L (ref 98–111)
Creatinine, Ser: 1.19 mg/dL — ABNORMAL HIGH (ref 0.44–1.00)
GFR, Estimated: 56 mL/min — ABNORMAL LOW (ref >60)
Glucose, Bld: 379 mg/dL — ABNORMAL HIGH (ref 70–99)
Potassium: 3.5 mmol/L (ref 3.5–5.1)
Sodium: 136 mmol/L (ref 135–145)

## 2024-06-27 LAB — GLUCOSE, CAPILLARY
Glucose-Capillary: 213 mg/dL — ABNORMAL HIGH (ref 70–99)
Glucose-Capillary: 159 mg/dL — ABNORMAL HIGH (ref 70–99)

## 2024-06-27 LAB — CBC
HCT: 43.4 % (ref 36.0–46.0)
Hemoglobin: 14.6 g/dL (ref 12.0–15.0)
MCH: 29.1 pg (ref 26.0–34.0)
MCHC: 33.6 g/dL (ref 30.0–36.0)
MCV: 86.6 fL (ref 80.0–100.0)
Platelets: 261 K/uL (ref 150–400)
RBC: 5.01 MIL/uL (ref 3.87–5.11)
RDW: 12 % (ref 11.5–15.5)
WBC: 8.4 K/uL (ref 4.0–10.5)
nRBC: 0 % (ref 0.0–0.2)

## 2024-06-27 LAB — CBG MONITORING, ED: Glucose-Capillary: 205 mg/dL — ABNORMAL HIGH (ref 70–99)

## 2024-06-27 LAB — HIV ANTIBODY (ROUTINE TESTING W REFLEX): HIV Screen 4th Generation wRfx: NONREACTIVE

## 2024-06-27 MED ORDER — PREGABALIN 75 MG PO CAPS
150.0000 mg | ORAL_CAPSULE | Freq: Three times a day (TID) | ORAL | Status: DC
  Administered 2024-06-27 (×2): 150 mg via ORAL
  Filled 2024-06-27 (×2): qty 2

## 2024-06-27 MED ORDER — DOCUSATE SODIUM 100 MG PO CAPS: 100.0000 mg | ORAL_CAPSULE | Freq: Two times a day (BID) | ORAL | 0 refills | Status: AC | PRN

## 2024-06-27 MED ORDER — ENOXAPARIN SODIUM 40 MG/0.4ML IJ SOSY
40.0000 mg | PREFILLED_SYRINGE | INTRAMUSCULAR | Status: DC
  Administered 2024-06-27: 40 mg via SUBCUTANEOUS
  Filled 2024-06-27: qty 0.4

## 2024-06-27 NOTE — Plan of Care (Signed)

## 2024-06-27 NOTE — Discharge Instructions (Addendum)
  Diet: Resume home heart healthy regular diet.   Activity: No heavy lifting >20 pounds (children, pets, laundry, garbage) or strenuous activity until follow-up, but light activity and walking are encouraged. Do not drive or drink alcohol if taking narcotic pain medications.  Wound care: May shower with soapy water and pat dry (do not rub incisions), but no baths or submerging incision underwater until follow-up. (no swimming)   Medications: Resume all home medications. For mild to moderate pain: acetaminophen  (Tylenol ) or ibuprofen  (if no kidney disease). Combining Tylenol  with alcohol can substantially increase your risk of causing liver disease. Narcotic pain medications can be used for severe pain, though may cause nausea, constipation, and drowsiness. Pain medication to be managed by chronic pain provider.   Call office [phone]) at any time if any questions, worsening pain, fevers/chills, bleeding, drainage from incision site, or other concerns.

## 2024-06-27 NOTE — Progress Notes (Signed)
 Pt had DC order. AVS was given and explained to pt with the presence of daughter, all questions has been answered. Daughter provided transportation.

## 2024-06-27 NOTE — Anesthesia Postprocedure Evaluation (Signed)
 Anesthesia Post Note  Patient: [name] [name]  Procedure(s) Performed: APPENDECTOMY, ROBOT-ASSISTED, LAPAROSCOPIC  Patient location during evaluation: PACU Anesthesia Type: General Level of consciousness: awake and alert Pain management: pain level controlled Vital Signs Assessment: post-procedure vital signs reviewed and stable Respiratory status: spontaneous breathing, nonlabored ventilation, respiratory function stable and patient connected to nasal cannula oxygen Cardiovascular status: blood pressure returned to baseline and stable Postop Assessment: no apparent nausea or vomiting Anesthetic complications: no   No notable events documented.   Last Vitals:  Vitals:   06/27/24 0130 06/27/24 0158  BP: 133/82 135/78  Pulse: 82 85  Resp: (!) 21 16  Temp: 36.9 C 36.4 C  SpO2: 94% 93%    Last Pain:  Vitals:   06/27/24 0130  TempSrc:   PainSc: 3                  Debby Mines

## 2024-06-27 NOTE — Discharge Summary (Signed)
 [facility]-General Surgery  SURGICAL DISCHARGE SUMMARY  Patient ID: [name] [name] MRN: [id number] DOB/AGE: [birth year] 47 y.o.  Admit date: 06/26/2024 Discharge date: 06/27/2024  Discharge Diagnoses Patient Active Problem List   Diagnosis Date Noted   Acute appendicitis 06/26/2024   OSA treated with BiPAP 09/09/2023   GAD (generalized anxiety disorder) 09/09/2023   Constipation 07/12/2023   Chronic prescription opiate use 07/12/2023   Encounter for colorectal cancer screening using Cologuard test 07/12/2023   Nausea and vomiting 05/09/2023   Positive colorectal cancer screening using Cologuard test 05/09/2023   Transient ischemic attack 04/17/2023   Encounter for general adult medical examination with abnormal findings 10/24/2020   Other fatigue 10/24/2020   B12 deficiency 10/24/2020   Encounter for screening mammogram for malignant neoplasm of breast 10/24/2020   Dysuria 10/24/2020   Acute recurrent pansinusitis 11/09/2019   Mitral valve disorder 11/09/2019   Carpal tunnel syndrome of right wrist 07/22/2019   Dysplastic nevus syndrome 10/09/2017   Primary osteoarthritis of left knee 04/06/2016   Chronic bilateral low back pain with bilateral sciatica 07/15/2015   Patellar maltracking 04/25/2015   Reflex sympathetic dystrophy 04/13/2015   Acquired lymphedema of lower extremity 11/10/2014   Hyperlipidemia 11/10/2014   Chronic venous insufficiency 11/09/2014   Mild intermittent asthma 10/21/2014   Patellofemoral joint pain 10/21/2014   Radiculopathy, lumbar region 10/12/2014   Lumbar radicular pain 10/12/2014   Bilateral lower extremity edema 10/01/2014   Myoclonus 04/07/2014   Pain of upper extremity 06/19/2013   Chest pain 04/28/2013   Encounter for therapeutic drug monitoring 08/05/2012   Opioid dependence with physiological dependence (HCC) 08/05/2012   Postlaminectomy syndrome, lumbar region 08/05/2012   COPD (chronic obstructive pulmonary disease) (HCC)  06/20/2012   Essential hypertension 06/20/2012   Panic attack 06/20/2012   Uncontrolled type 2 diabetes mellitus with hyperglycemia (HCC) 06/20/2012   Type 2 diabetes mellitus without complication, without long-term current use of insulin  (HCC) 06/20/2012   Hypoglycemia 08/02/2007   Anxiety 08/02/2007   PANIC DISORDER 08/02/2007   Asthma 08/02/2007   Menopausal symptoms 08/02/2007   FIBROMYALGIA 08/02/2007   Abnormal liver function tests 08/02/2007   TOBACCO ABUSE, HX OF 08/02/2007   Asthma 08/02/2007    Consultants None  Procedures Robotic assisted laparoscopic appendectomy    Hospital Course:  Patient presented to the [facility] ED on 06/26/2024 with right lower quadrant abdominal pain for the last two days.  In the ED, patient was borderline tachycardic with otherwise normal vital signs. Labs and imaging were ordered. Labs showed no leukocytosis. Normal LFTs, lipase and total bilirubin levels. Creatinine levels slightly elevated to 1.1. CT of abdomen and pelvis showed dilated appendix with surrounding inflammation consistent with acute appendicitis. No perforation or free air.  Patient was taken to the operating room later that night for robotic assisted laparoscopic appendectomy. Surgery went well. Patient tolerated procedure.  Patient is now tolerating diet post-op day 1 and is able to ambulate. Surgical incisions show no signs of infection. Pain is well-controlled with pain medication. Patient is clear from surgical standpoint.  Patient will follow-up outpatient with Dr. Tye in 2 weeks.     Physical Examination:  Constitutional: alert, in no acute distress Pulmonary: CTA bilaterally, normal breath sounds Cardiac: regular rate and rhythm Gastrointestinal: soft, mildly tender, and mildly distended Skin: abdominal incisions look clean, dry, and intact with surgical glue in place    Allergies as of 06/27/2024       Reactions   Oxycodone  Hcl Nausea And Vomiting  Pollen Extract  Other (See Comments)   Congestion, sneezing   Topiramate Other (See Comments)   Jerking  Jerking  Jerking  Other reaction(s): Other (See Comments) Jerking    Bee Venom Hives, Itching, Swelling   Buspirone     Crestor [rosuvastatin] Other (See Comments)   Muscle spasms   Doxycycline Nausea Only   Mirtazapine     Oxycontin  [oxycodone ] Nausea Only   Wellbutrin  [bupropion ]         Medication List     TAKE these medications    Accu-Chek Guide Me w/Device Kit Use as directed. E11.65   Accu-Chek Guide test strip Generic drug: glucose blood Use 1 test strip to check fasting glucose once daily and as need for diabetes   Accu-Chek Softclix Lancets lancets Use 1 lancet to check glucose level once daily   albuterol  108 (90 Base) MCG/ACT inhaler Commonly known as: VENTOLIN  HFA INHALE 1 TO 2 PUFFS INTO THE LUNGS EVERY 6 HOURS AS NEEDED FOR WHEEZING OR SHORTNESS OF BREATH   ALPRAZolam  0.5 MG tablet Commonly known as: XANAX  Take 1 tablet (0.5 mg total) by mouth 2 (two) times daily as needed for anxiety or sleep.   ammonium lactate  12 % lotion Commonly known as: LAC-HYDRIN  Apply 1 application topically as needed for dry skin.   atorvastatin  10 MG tablet Commonly known as: LIPITOR Take 1 tablet (10 mg total) by mouth daily.   cariprazine  3 MG capsule Commonly known as: Vraylar  Take 1 capsule (3 mg total) by mouth daily.   Cholecalciferol  25 MCG (1000 UT) tablet Take 1 tablet (1,000 Units total) by mouth once a week.   Combivent  Respimat 20-100 MCG/ACT Aers respimat Generic drug: Ipratropium-Albuterol  INHALE 1 PUFF INTO THE LUNGS EVERY 6 HOURS AS NEEDED FOR WHEEZING   dapagliflozin  propanediol 10 MG Tabs tablet Commonly known as: Farxiga  Take 1 tab po daily   diclofenac  Sodium 1 % Gel Commonly known as: VOLTAREN  SMARTSIG:2-4 Gram(s) Topical 1-4 Times Daily PRN   docusate sodium  100 MG capsule Commonly known as: Colace Take 1 capsule (100 mg total) by mouth 2 (two)  times daily as needed for up to 10 days for mild constipation.   fluticasone  50 MCG/ACT nasal spray Commonly known as: FLONASE  Place 2 sprays into both nostrils daily.   HYDROmorphone  4 MG tablet Commonly known as: DILAUDID  Take 4 mg by mouth 4 (four) times daily as needed.   ibuprofen  800 MG tablet Commonly known as: ADVIL  Take 1 tablet (800 mg total) by mouth every 8 (eight) hours as needed for moderate pain (pain score 4-6).   Insulin  Pen Needle 31G X 5 MM Misc Use as directed  Once a day Dx e11.65   levocetirizine 5 MG tablet Commonly known as: XYZAL  Take 1 tablet (5 mg total) by mouth every evening.   levothyroxine  50 MCG tablet Commonly known as: SYNTHROID  Take 1 tablet (50 mcg total) by mouth daily before breakfast.   methocarbamol  500 MG tablet Commonly known as: ROBAXIN  PLEASE SEE ATTACHED FOR DETAILED DIRECTIONS   morphine  60 MG 12 hr tablet Commonly known as: MS CONTIN  Take 60 mg by mouth every 12 (twelve) hours.   Multi-Vitamin tablet Take 1 tablet by mouth daily.   nystatin  powder Commonly known as: MYCOSTATIN /NYSTOP  Apply 1 Application topically 3 (three) times daily.   ondansetron  4 MG disintegrating tablet Commonly known as: ZOFRAN -ODT Take 1 tablet (4 mg total) by mouth every 8 (eight) hours as needed for nausea or vomiting.   Ozempic  (2 MG/DOSE) 8 MG/3ML  Sopn Generic drug: Semaglutide  (2 MG/DOSE) INJECT 2MG  UNDER SKIN WEEKLY   pantoprazole  40 MG tablet Commonly known as: PROTONIX  Take 1 tablet (40 mg total) by mouth daily.   pregabalin  150 MG capsule Commonly known as: LYRICA  Take 150 mg by mouth 3 (three) times daily.   promethazine  25 MG tablet Commonly known as: PHENERGAN  TAKE 1 TABLET(25 MG) BY MOUTH EVERY 8 HOURS AS NEEDED FOR NAUSEA OR VOMITING   rOPINIRole  0.25 MG tablet Commonly known as: REQUIP  Take 1-2 tabs at night for restless legs   traZODone  100 MG tablet Commonly known as: DESYREL  Take 1 tablet (100 mg total) by mouth  at bedtime.   Vitamin D  (Ergocalciferol ) 1.25 MG (50000 UNIT) Caps capsule Commonly known as: DRISDOL  Take 1 capsule (50,000 Units total) by mouth every 7 (seven) days.          Follow-up Information     Tye, Isami, DO Follow up in 2 week(s).   Specialties: General Surgery, Surgery Why: 2 week follow-up post-op robotic assisted lap appendectomy Contact information: [address] [place] [address] [phone]                  Time spent on discharge management including discussion of hospital course, clinical condition, outpatient instructions, prescriptions, and follow up with the patient and members of the medical team: >30 minutes  Nyiah Pianka Barrientos PA-C

## 2024-06-27 NOTE — Care Management Obs Status (Signed)
 MEDICARE OBSERVATION STATUS NOTIFICATION   Patient Details  Name: [name] [name] MRN: [id number] Date of Birth: [birth year]   Medicare Observation Status Notification Given:  No (patient did not want a copy)    [name] 06/27/2024, 11:56 AM

## 2024-06-30 ENCOUNTER — Ambulatory Visit: Admitting: Urology

## 2024-06-30 VITALS — BP 115/73 | HR 96 | Ht 61.0 in | Wt 150.0 lb

## 2024-06-30 DIAGNOSIS — R3129 Other microscopic hematuria: Secondary | ICD-10-CM | POA: Diagnosis not present

## 2024-06-30 LAB — MICROSCOPIC EXAMINATION: Bacteria, UA: NONE SEEN

## 2024-06-30 LAB — URINALYSIS, COMPLETE
Bilirubin, UA: NEGATIVE
Ketones, UA: NEGATIVE
Leukocytes,UA: NEGATIVE
Nitrite, UA: NEGATIVE
Protein,UA: NEGATIVE
Specific Gravity, UA: 1.02 (ref 1.005–1.030)
Urobilinogen, Ur: 0.2 mg/dL (ref 0.2–1.0)
pH, UA: 6 (ref 5.0–7.5)

## 2024-06-30 NOTE — Progress Notes (Signed)
 [name] [name]

## 2024-06-30 NOTE — Progress Notes (Signed)
   06/30/24  CC: No chief complaint on file.   HPI: Refer to Dr. Bjorn previous note 05/28/2024.  She underwent an appendectomy 7/17.  UA today microscopy negative RBC/WBC  Blood pressure 115/73, pulse 96, height 5' 1 (1.549 m), weight 150 lb (68 kg), last menstrual period 12/13/1991. NED. A&Ox3.   No respiratory distress   Abd soft, NT, ND Normal external genitalia with patent urethral meatus  Cystoscopy Procedure Note  Patient identification was confirmed, informed consent was obtained, and patient was prepped using Betadine solution.  Lidocaine  jelly was administered per urethral meatus..  No urethral mass, induration.  Mild bladder tenderness  Procedure: - Flexible cystoscope introduced, without any difficulty.   - Thorough search of the bladder revealed:    normal urethral meatus    normal urothelium; squamous metaplasia trigone    no stones    no ulcers     no tumors    no urethral polyps    no trabeculation  - Ureteral orifices were normal in position and appearance.  Post-Procedure: - Patient tolerated the procedure well  Assessment/ Plan: No mucosal abnormalities on cystoscopy UA today clear PA follow-up 6 months with repeat UA If having persistent dysuria after she heals from her appendectomy she will call for follow-up   [name] [name] Barba, MD

## 2024-07-02 LAB — SURGICAL PATHOLOGY

## 2024-07-17 ENCOUNTER — Other Ambulatory Visit: Payer: Self-pay | Admitting: Nurse Practitioner

## 2024-07-17 DIAGNOSIS — Z0000 Encounter for general adult medical examination without abnormal findings: Secondary | ICD-10-CM

## 2024-07-23 ENCOUNTER — Telehealth: Payer: Self-pay

## 2024-07-23 NOTE — Telephone Encounter (Signed)
 Spoke with that she is due for her Diabetic eye exam please call and schedule them ASAP

## 2024-07-29 ENCOUNTER — Encounter: Payer: Self-pay | Admitting: Nurse Practitioner

## 2024-07-29 ENCOUNTER — Ambulatory Visit (INDEPENDENT_AMBULATORY_CARE_PROVIDER_SITE_OTHER): Admitting: Nurse Practitioner

## 2024-07-29 VITALS — BP 117/77 | HR 89 | Temp 97.4°F | Resp 16 | Ht 61.0 in | Wt 155.0 lb

## 2024-07-29 DIAGNOSIS — E785 Hyperlipidemia, unspecified: Secondary | ICD-10-CM

## 2024-07-29 DIAGNOSIS — N951 Menopausal and female climacteric states: Secondary | ICD-10-CM

## 2024-07-29 DIAGNOSIS — Z79899 Other long term (current) drug therapy: Secondary | ICD-10-CM

## 2024-07-29 DIAGNOSIS — E039 Hypothyroidism, unspecified: Secondary | ICD-10-CM | POA: Diagnosis not present

## 2024-07-29 DIAGNOSIS — E1169 Type 2 diabetes mellitus with other specified complication: Secondary | ICD-10-CM | POA: Diagnosis not present

## 2024-07-29 DIAGNOSIS — F411 Generalized anxiety disorder: Secondary | ICD-10-CM

## 2024-07-29 LAB — POCT GLYCOSYLATED HEMOGLOBIN (HGB A1C): Hemoglobin A1C: 7 % — AB (ref 4.0–5.6)

## 2024-07-29 MED ORDER — MOUNJARO 5 MG/0.5ML ~~LOC~~ SOAJ: 5.0000 mg | SUBCUTANEOUS | 2 refills | Status: DC

## 2024-07-29 MED ORDER — LEVOTHYROXINE SODIUM 50 MCG PO TABS
50.0000 ug | ORAL_TABLET | Freq: Every day | ORAL | 1 refills | Status: DC
Start: 2024-07-29 — End: 2024-10-21

## 2024-07-29 MED ORDER — ALPRAZOLAM 0.5 MG PO TABS: 0.5000 mg | ORAL_TABLET | Freq: Two times a day (BID) | ORAL | 2 refills | Status: DC | PRN

## 2024-07-29 MED ORDER — LEVOCETIRIZINE DIHYDROCHLORIDE 5 MG PO TABS: 5.0000 mg | ORAL_TABLET | Freq: Every evening | ORAL | 1 refills | Status: DC

## 2024-07-29 MED ORDER — ESTRADIOL 0.0375 MG/24HR TD PTWK: 0.0375 mg | MEDICATED_PATCH | TRANSDERMAL | 12 refills | Status: DC

## 2024-07-29 MED ORDER — PANTOPRAZOLE SODIUM 40 MG PO TBEC: 40.0000 mg | DELAYED_RELEASE_TABLET | Freq: Every day | ORAL | 1 refills | Status: DC

## 2024-07-29 MED ORDER — DAPAGLIFLOZIN PROPANEDIOL 10 MG PO TABS: ORAL_TABLET | ORAL | 1 refills | Status: DC

## 2024-07-29 MED ORDER — ATORVASTATIN CALCIUM 10 MG PO TABS: 10.0000 mg | ORAL_TABLET | Freq: Every day | ORAL | 5 refills | Status: DC

## 2024-07-29 MED ORDER — CARIPRAZINE HCL 3 MG PO CAPS: 3.0000 mg | ORAL_CAPSULE | Freq: Every day | ORAL | 5 refills | Status: DC

## 2024-07-29 MED ORDER — ONDANSETRON 4 MG PO TBDP: 4.0000 mg | ORAL_TABLET | Freq: Three times a day (TID) | ORAL | 5 refills | Status: AC | PRN

## 2024-07-29 NOTE — Progress Notes (Signed)
 [facility] [address] [place], [place] [address]  Internal MEDICINE  Office Visit Note  Patient Name: [name] [name]  [id number]  [id number]  Date of Service: 07/29/2024  Chief Complaint  Patient presents with   Diabetes   Hyperlipidemia   Follow-up    HPI [name] [name] presents for a follow-up visit for diabetes, hot flashes, high cholesterol, anxiety, and hypothyroidism. Diabetes -- A1c is elevated at 7.0. she is currently taking farxiga  and max dose ozempic . She has not tried mounjaro  before which may control her A1c better.  Hot flashes -- on pregabalin  so we cannot try gabapentin, veozah is not covered by her insurance. Her depressive symptoms are controlled well by her current medication so changing antidepressants is not recommended. She has no history of cancer and has had a hysterectomy so HRT with estradiol  is a good choice for managing hot flashes and other menopausal symptoms.  Anxiety -- takes alprazolam  as needed, due for refills High cholesterol -- taking atorvastatin  daily.  Hypothyroidism -- taking levothyroxine  daily.  FYI: Issues with getting morphine  from pharmacy which is prescribed by her pain management specialist.     Current Medication: Outpatient Encounter Medications as of 07/29/2024  Medication Sig   estradiol  (CLIMARA ) 0.0375 mg/24hr patch Place 1 patch (0.0375 mg total) onto the skin once a week.   tirzepatide  (MOUNJARO ) 5 MG/0.5ML Pen Inject 5 mg into the skin once a week.   Accu-Chek Softclix Lancets lancets Use 1 lancet to check glucose level once daily   albuterol  (VENTOLIN  HFA) 108 (90 Base) MCG/ACT inhaler INHALE 1 TO 2 PUFFS INTO THE LUNGS EVERY 6 HOURS AS NEEDED FOR WHEEZING OR SHORTNESS OF BREATH   ALPRAZolam  (XANAX ) 0.5 MG tablet Take 1 tablet (0.5 mg total) by mouth 2 (two) times daily as needed for anxiety or sleep.   ammonium lactate  (LAC-HYDRIN ) 12 % lotion Apply 1 application topically as needed for dry skin.   atorvastatin   (LIPITOR) 10 MG tablet Take 1 tablet (10 mg total) by mouth daily.   Blood Glucose Monitoring Suppl (ACCU-CHEK GUIDE ME) w/Device KIT Use as directed. E11.65   cariprazine  (VRAYLAR ) 3 MG capsule Take 1 capsule (3 mg total) by mouth daily.   Cholecalciferol  25 MCG (1000 UT) tablet Take 1 tablet (1,000 Units total) by mouth once a week.   dapagliflozin  propanediol (FARXIGA ) 10 MG TABS tablet Take 1 tab po daily   diclofenac  Sodium (VOLTAREN ) 1 % GEL SMARTSIG:2-4 Gram(s) Topical 1-4 Times Daily PRN   fluticasone  (FLONASE ) 50 MCG/ACT nasal spray Place 2 sprays into both nostrils daily.   glucose blood (ACCU-CHEK GUIDE) test strip Use 1 test strip to check fasting glucose once daily and as need for diabetes   HYDROmorphone  (DILAUDID ) 4 MG tablet Take 4 mg by mouth 4 (four) times daily as needed.   ibuprofen  (ADVIL ) 800 MG tablet Take 1 tablet (800 mg total) by mouth every 8 (eight) hours as needed for moderate pain (pain score 4-6).   Insulin  Pen Needle 31G X 5 MM MISC Use as directed  Once a day Dx e11.65   Ipratropium-Albuterol  (COMBIVENT  RESPIMAT) 20-100 MCG/ACT AERS respimat INHALE 1 PUFF INTO THE LUNGS EVERY 6 HOURS AS NEEDED FOR WHEEZING   levocetirizine (XYZAL ) 5 MG tablet Take 1 tablet (5 mg total) by mouth every evening.   levothyroxine  (SYNTHROID ) 50 MCG tablet Take 1 tablet (50 mcg total) by mouth daily before breakfast.   methocarbamol  (ROBAXIN ) 500 MG tablet PLEASE SEE ATTACHED FOR DETAILED DIRECTIONS  morphine  (MS CONTIN ) 60 MG 12 hr tablet Take 60 mg by mouth every 12 (twelve) hours.   Multiple Vitamin (MULTI-VITAMIN) tablet Take 1 tablet by mouth daily.   nystatin  (MYCOSTATIN /NYSTOP ) powder Apply 1 Application topically 3 (three) times daily.   ondansetron  (ZOFRAN -ODT) 4 MG disintegrating tablet Take 1 tablet (4 mg total) by mouth every 8 (eight) hours as needed for nausea or vomiting.   pantoprazole  (PROTONIX ) 40 MG tablet Take 1 tablet (40 mg total) by mouth daily.   pregabalin   (LYRICA ) 150 MG capsule Take 150 mg by mouth 3 (three) times daily.    promethazine  (PHENERGAN ) 25 MG tablet TAKE 1 TABLET(25 MG) BY MOUTH EVERY 8 HOURS AS NEEDED FOR NAUSEA OR VOMITING   rOPINIRole  (REQUIP ) 0.25 MG tablet Take 1-2 tabs at night for restless legs   traZODone  (DESYREL ) 100 MG tablet TAKE 1 TABLET(100 MG) BY MOUTH AT BEDTIME   Vitamin D , Ergocalciferol , (DRISDOL ) 1.25 MG (50000 UNIT) CAPS capsule Take 1 capsule (50,000 Units total) by mouth every 7 (seven) days.   [DISCONTINUED] ALPRAZolam  (XANAX ) 0.5 MG tablet Take 1 tablet (0.5 mg total) by mouth 2 (two) times daily as needed for anxiety or sleep.   [DISCONTINUED] atorvastatin  (LIPITOR) 10 MG tablet Take 1 tablet (10 mg total) by mouth daily.   [DISCONTINUED] cariprazine  (VRAYLAR ) 3 MG capsule Take 1 capsule (3 mg total) by mouth daily.   [DISCONTINUED] dapagliflozin  propanediol (FARXIGA ) 10 MG TABS tablet Take 1 tab po daily   [DISCONTINUED] levocetirizine (XYZAL ) 5 MG tablet Take 1 tablet (5 mg total) by mouth every evening.   [DISCONTINUED] levothyroxine  (SYNTHROID ) 50 MCG tablet Take 1 tablet (50 mcg total) by mouth daily before breakfast.   [DISCONTINUED] ondansetron  (ZOFRAN -ODT) 4 MG disintegrating tablet Take 1 tablet (4 mg total) by mouth every 8 (eight) hours as needed for nausea or vomiting.   [DISCONTINUED] pantoprazole  (PROTONIX ) 40 MG tablet Take 1 tablet (40 mg total) by mouth daily.   [DISCONTINUED] Semaglutide , 2 MG/DOSE, (OZEMPIC , 2 MG/DOSE,) 8 MG/3ML SOPN INJECT 2MG  UNDER SKIN WEEKLY   No facility-administered encounter medications on file as of 07/29/2024.    Surgical History: Past Surgical History:  Procedure Laterality Date   ABDOMINAL HYSTERECTOMY     CARPAL TUNNEL RELEASE     COLONOSCOPY WITH PROPOFOL  N/A 05/09/2023   Procedure: COLONOSCOPY WITH PROPOFOL ;  Surgeon: Therisa Bi, MD;  Location: [facility] ENDOSCOPY;  Service: Gastroenterology;  Laterality: N/A;   COLONOSCOPY WITH PROPOFOL  N/A 10/09/2023    Procedure: COLONOSCOPY WITH PROPOFOL ;  Surgeon: Eartha Angelia Sieving, MD;  Location: [facility] ENDO SUITE;  Service: Gastroenterology;  Laterality: N/A;  1:00PM;ASA 3, bumpt up to 12:45 per Mindy   ESOPHAGOGASTRODUODENOSCOPY (EGD) WITH PROPOFOL  N/A 05/09/2023   Procedure: ESOPHAGOGASTRODUODENOSCOPY (EGD) WITH PROPOFOL ;  Surgeon: Therisa Bi, MD;  Location[facility] END[facility]COPY;  Service: Gastroenterology;  Laterality: N/A;   FOOT TENDON SURGERY Right    KNEE SURGERY     SPINAL FUSION     SPINAL FUSION     XI ROBOTIC LAPAROSCOPIC ASSISTED APPENDECTOMY N/A 06/26/2024   Procedure: APPENDECTOMY, ROBOT-ASSISTED, LAPAROSCOPIC;  Surgeon: Tye Millet, DO;  Location: [facility] ORS;  Service: General;  Laterality: N/A;    Medical History: Past Medical History:  Diagnosis Date   Actinic keratitis    Allergy    Anxiety    Asthma    Basal cell carcinoma    Chronic venous insufficiency    DDD (degenerative disc disease), lumbar    Diabetes mellitus without complication (HCC)    Fibromyalgia  Fibromyalgia    Hyperlipidemia    Lymphedema    Melanoma (HCC)    Migraine    Mitral valve prolapse    Sleep apnea    Tendonitis of foot    left    Family History: Family History  Problem Relation Age of Onset   Cancer Paternal Aunt    Cancer Paternal Uncle    Diabetes Paternal Grandfather    Heart disease Paternal Grandfather     Social History   Socioeconomic History   Marital status: Divorced    Spouse name: Not on file   Number of children: Not on file   Years of education: Not on file   Highest education level: Not on file  Occupational History   Not on file  Tobacco Use   Smoking status: Never    Passive exposure: Past   Smokeless tobacco: Never  Vaping Use   Vaping status: Never Used  Substance and Sexual Activity   Alcohol use: No   Drug use: No   Sexual activity: Not on file  Other Topics Concern   Not on file  Social History Narrative   Not on file   Social Drivers of Health    Financial Resource Strain: Low Risk  (07/09/2024)   Received from [facility]   Overall Financial Resource Strain (CARDIA)    Difficulty of Paying Living Expenses: Not hard at all  Food Insecurity: No Food Insecurity (07/09/2024)   Received from [facility]   Hunger Vital Sign    Within the past 12 months, you worried that your food would run out before you got the money to buy more.: Never true    Within the past 12 months, the food you bought just didn't last and you didn't have money to get more.: Never true  Transportation Needs: No Transportation Needs (07/09/2024)   Received from [facility] - Transportation    In the past 12 months, has lack of transportation kept you from medical appointments or from getting medications?: No    Lack of Transportation (Non-Medical): No  Physical Activity: Not on file  Stress: Not on file  Social Connections: Unknown (04/09/2022)   Received from [facility]   Social Network    Social Network: Not on file  Intimate Partner Violence: Unknown (03/13/2022)   Received from [facility]   HITS    Physically Hurt: Not on file    Insult or Talk Down To: Not on file    Threaten Physical Harm: Not on file    Scream or Curse: Not on file      Review of Systems  Constitutional:  Positive for fatigue. Negative for chills and unexpected weight change.  HENT:  Negative for congestion, postnasal drip, rhinorrhea, sneezing and sore throat.   Eyes:  Negative for redness.  Respiratory: Negative.  Negative for cough, chest tightness, shortness of breath and wheezing.   Cardiovascular: Negative.  Negative for chest pain and palpitations.  Gastrointestinal:  Positive for constipation and nausea. Negative for abdominal pain, diarrhea and vomiting.  Genitourinary:  Negative for dysuria and frequency.  Musculoskeletal:  Positive for arthralgias and back pain. Negative for joint swelling and neck  pain.  Skin:  Negative for rash.  Neurological:  Negative for tremors and numbness.  Hematological:  Negative for adenopathy. Does not bruise/bleed easily.  Psychiatric/Behavioral:  Positive for behavioral problems (Depression), dysphoric mood and sleep disturbance. Negative for self-injury and suicidal ideas. The patient  is nervous/anxious.     Vital Signs: BP 117/77   Pulse 89   Temp (!) 97.4 F (36.3 C)   Resp 16   Ht 5' 1 (1.549 m)   Wt 155 lb (70.3 kg)   LMP 12/13/1991   SpO2 95%   BMI 29.29 kg/m    Physical Exam Vitals reviewed.  Constitutional:      Appearance: Normal appearance. She is obese.  HENT:     Head: Normocephalic and atraumatic.  Eyes:     Pupils: Pupils are equal, round, and reactive to light.  Cardiovascular:     Rate and Rhythm: Normal rate and regular rhythm.  Pulmonary:     Effort: Pulmonary effort is normal. No respiratory distress.  Neurological:     Mental Status: She is alert and oriented to person, place, and time.  Psychiatric:        Mood and Affect: Mood normal.        Behavior: Behavior normal.        Assessment/Plan: 1. Type 2 diabetes mellitus with other specified complication, without long-term current use of insulin  (HCC) (Primary) A1c is elevated. Discontinue ozempic . Start mounjaro  as prescribed. Continue farxiga  as well.  - POCT glycosylated hemoglobin (Hb A1C) - dapagliflozin  propanediol (FARXIGA ) 10 MG TABS tablet; Take 1 tab po daily  Dispense: 90 tablet; Refill: 1 - tirzepatide  (MOUNJARO ) 5 MG/0.5ML Pen; Inject 5 mg into the skin once a week.  Dispense: 2 mL; Refill: 2  2. Hot flashes due to menopause Will try climara  patch - estradiol  (CLIMARA ) 0.0375 mg/24hr patch; Place 1 patch (0.0375 mg total) onto the skin once a week.  Dispense: 4 patch; Refill: 12  3. Acquired hypothyroidism Continue levothyroxine  as prescribed.  - levothyroxine  (SYNTHROID ) 50 MCG tablet; Take 1 tablet (50 mcg total) by mouth daily before  breakfast.  Dispense: 90 tablet; Refill: 1  4. Hyperlipidemia associated with type 2 diabetes mellitus (HCC) Continue atorvastatin  as prescribed.  - atorvastatin  (LIPITOR) 10 MG tablet; Take 1 tablet (10 mg total) by mouth daily.  Dispense: 30 tablet; Refill: 5  5. Encounter for medication review Medication list reviewed, updated and refills ordered. - pantoprazole  (PROTONIX ) 40 MG tablet; Take 1 tablet (40 mg total) by mouth daily.  Dispense: 90 tablet; Refill: 1 - ondansetron  (ZOFRAN -ODT) 4 MG disintegrating tablet; Take 1 tablet (4 mg total) by mouth every 8 (eight) hours as needed for nausea or vomiting.  Dispense: 20 tablet; Refill: 5 - levocetirizine (XYZAL ) 5 MG tablet; Take 1 tablet (5 mg total) by mouth every evening.  Dispense: 90 tablet; Refill: 1 - cariprazine  (VRAYLAR ) 3 MG capsule; Take 1 capsule (3 mg total) by mouth daily.  Dispense: 30 capsule; Refill: 5  6. GAD (generalized anxiety disorder) Continue prn alprazolam  as prescribed. Follow up in 3 months for additional refills. - ALPRAZolam  (XANAX ) 0.5 MG tablet; Take 1 tablet (0.5 mg total) by mouth 2 (two) times daily as needed for anxiety or sleep.  Dispense: 60 tablet; Refill: 2   General Counseling: [name] [name] verbalizes understanding of the findings of todays visit and agrees with plan of treatment. I have discussed any further diagnostic evaluation that may be needed or ordered today. We also reviewed her medications today. she has been encouraged to call the office with any questions or concerns that should arise related to todays visit.    Orders Placed This Encounter  Procedures   POCT glycosylated hemoglobin (Hb A1C)    Meds ordered this encounter  Medications  ALPRAZolam  (XANAX ) 0.5 MG tablet    Sig: Take 1 tablet (0.5 mg total) by mouth 2 (two) times daily as needed for anxiety or sleep.    Dispense:  60 tablet    Refill:  2    Patient is aware to not take alprazolam  with her narcotic pain medications.    dapagliflozin  propanediol (FARXIGA ) 10 MG TABS tablet    Sig: Take 1 tab po daily    Dispense:  90 tablet    Refill:  1   tirzepatide  (MOUNJARO ) 5 MG/0.5ML Pen    Sig: Inject 5 mg into the skin once a week.    Dispense:  2 mL    Refill:  2    Discontinue ozempic  and fill new script today, send prior auth if required asap   pantoprazole  (PROTONIX ) 40 MG tablet    Sig: Take 1 tablet (40 mg total) by mouth daily.    Dispense:  90 tablet    Refill:  1   ondansetron  (ZOFRAN -ODT) 4 MG disintegrating tablet    Sig: Take 1 tablet (4 mg total) by mouth every 8 (eight) hours as needed for nausea or vomiting.    Dispense:  20 tablet    Refill:  5    For future refills, keep on file   levothyroxine  (SYNTHROID ) 50 MCG tablet    Sig: Take 1 tablet (50 mcg total) by mouth daily before breakfast.    Dispense:  90 tablet    Refill:  1   levocetirizine (XYZAL ) 5 MG tablet    Sig: Take 1 tablet (5 mg total) by mouth every evening.    Dispense:  90 tablet    Refill:  1   cariprazine  (VRAYLAR ) 3 MG capsule    Sig: Take 1 capsule (3 mg total) by mouth daily.    Dispense:  30 capsule    Refill:  5    Vraylar  was recently approved. Note increased dose to 3 mg. Discontinue 1.5 mg dose.   atorvastatin  (LIPITOR) 10 MG tablet    Sig: Take 1 tablet (10 mg total) by mouth daily.    Dispense:  30 tablet    Refill:  5    For future refills, keep on file   estradiol  (CLIMARA ) 0.0375 mg/24hr patch    Sig: Place 1 patch (0.0375 mg total) onto the skin once a week.    Dispense:  4 patch    Refill:  12    Fill new script today    Return in about 3 months (around 10/22/2024) for F/U, anxiety med refill, Zakyria Metzinger PCP, eval new med.   Total time spent:30 Minutes Time spent includes review of chart, medications, test results, and follow up plan with the patient.   [place] Controlled Substance Database was reviewed by me.  This patient was seen by Mardy Maxin, FNP-C in collaboration with Dr. Sigrid Bathe as a  part of collaborative care agreement.   Delrico Minehart R. Maxin, MSN, FNP-C Internal medicine

## 2024-08-08 ENCOUNTER — Encounter: Payer: Self-pay | Admitting: Nurse Practitioner

## 2024-08-08 DIAGNOSIS — S9032XA Contusion of left foot, initial encounter: Secondary | ICD-10-CM | POA: Diagnosis not present

## 2024-08-08 DIAGNOSIS — M1712 Unilateral primary osteoarthritis, left knee: Secondary | ICD-10-CM | POA: Diagnosis not present

## 2024-08-16 ENCOUNTER — Encounter: Payer: Self-pay | Admitting: Nurse Practitioner

## 2024-08-16 DIAGNOSIS — E1169 Type 2 diabetes mellitus with other specified complication: Secondary | ICD-10-CM

## 2024-08-16 DIAGNOSIS — S93602D Unspecified sprain of left foot, subsequent encounter: Secondary | ICD-10-CM | POA: Diagnosis not present

## 2024-08-19 DIAGNOSIS — M62838 Other muscle spasm: Secondary | ICD-10-CM | POA: Diagnosis not present

## 2024-08-19 DIAGNOSIS — M5431 Sciatica, right side: Secondary | ICD-10-CM | POA: Diagnosis not present

## 2024-08-19 DIAGNOSIS — Z79899 Other long term (current) drug therapy: Secondary | ICD-10-CM | POA: Diagnosis not present

## 2024-08-19 DIAGNOSIS — M797 Fibromyalgia: Secondary | ICD-10-CM | POA: Diagnosis not present

## 2024-08-19 DIAGNOSIS — G894 Chronic pain syndrome: Secondary | ICD-10-CM | POA: Diagnosis not present

## 2024-08-19 DIAGNOSIS — M25562 Pain in left knee: Secondary | ICD-10-CM | POA: Diagnosis not present

## 2024-08-19 DIAGNOSIS — M545 Low back pain, unspecified: Secondary | ICD-10-CM | POA: Diagnosis not present

## 2024-08-21 MED ORDER — MOUNJARO 7.5 MG/0.5ML ~~LOC~~ SOAJ: 7.5000 mg | SUBCUTANEOUS | 5 refills | Status: DC

## 2024-09-09 ENCOUNTER — Other Ambulatory Visit: Payer: Self-pay | Admitting: Nurse Practitioner

## 2024-09-09 DIAGNOSIS — J4520 Mild intermittent asthma, uncomplicated: Secondary | ICD-10-CM

## 2024-09-16 DIAGNOSIS — M25562 Pain in left knee: Secondary | ICD-10-CM | POA: Diagnosis not present

## 2024-09-16 DIAGNOSIS — M62838 Other muscle spasm: Secondary | ICD-10-CM | POA: Diagnosis not present

## 2024-09-16 DIAGNOSIS — M797 Fibromyalgia: Secondary | ICD-10-CM | POA: Diagnosis not present

## 2024-09-16 DIAGNOSIS — G894 Chronic pain syndrome: Secondary | ICD-10-CM | POA: Diagnosis not present

## 2024-09-16 DIAGNOSIS — M5431 Sciatica, right side: Secondary | ICD-10-CM | POA: Diagnosis not present

## 2024-09-16 DIAGNOSIS — Z79899 Other long term (current) drug therapy: Secondary | ICD-10-CM | POA: Diagnosis not present

## 2024-09-16 DIAGNOSIS — M545 Low back pain, unspecified: Secondary | ICD-10-CM | POA: Diagnosis not present

## 2024-09-24 ENCOUNTER — Encounter (INDEPENDENT_AMBULATORY_CARE_PROVIDER_SITE_OTHER): Payer: Self-pay | Admitting: Gastroenterology

## 2024-10-07 ENCOUNTER — Other Ambulatory Visit: Payer: Self-pay | Admitting: Nurse Practitioner

## 2024-10-07 ENCOUNTER — Encounter: Payer: Self-pay | Admitting: Nurse Practitioner

## 2024-10-07 DIAGNOSIS — Z0000 Encounter for general adult medical examination without abnormal findings: Secondary | ICD-10-CM

## 2024-10-08 ENCOUNTER — Other Ambulatory Visit: Payer: Self-pay

## 2024-10-08 MED ORDER — FLUCONAZOLE 150 MG PO TABS: ORAL_TABLET | ORAL | 0 refills | Status: DC

## 2024-10-08 NOTE — Telephone Encounter (Signed)
 Pt advised that we sent med diflucan  once a daily for 5 days

## 2024-10-10 ENCOUNTER — Other Ambulatory Visit: Payer: Self-pay | Admitting: Nurse Practitioner

## 2024-10-10 DIAGNOSIS — J4520 Mild intermittent asthma, uncomplicated: Secondary | ICD-10-CM

## 2024-10-21 ENCOUNTER — Encounter: Payer: Self-pay | Admitting: Nurse Practitioner

## 2024-10-21 ENCOUNTER — Ambulatory Visit: Admitting: Nurse Practitioner

## 2024-10-21 VITALS — BP 120/70 | HR 87 | Temp 97.2°F | Resp 16 | Ht 61.0 in | Wt 151.6 lb

## 2024-10-21 DIAGNOSIS — E039 Hypothyroidism, unspecified: Secondary | ICD-10-CM

## 2024-10-21 DIAGNOSIS — G4762 Sleep related leg cramps: Secondary | ICD-10-CM | POA: Diagnosis not present

## 2024-10-21 DIAGNOSIS — F332 Major depressive disorder, recurrent severe without psychotic features: Secondary | ICD-10-CM

## 2024-10-21 DIAGNOSIS — J4489 Other specified chronic obstructive pulmonary disease: Secondary | ICD-10-CM | POA: Diagnosis not present

## 2024-10-21 DIAGNOSIS — F16283 Hallucinogen dependence with hallucinogen persisting perception disorder (flashbacks): Secondary | ICD-10-CM

## 2024-10-21 DIAGNOSIS — F411 Generalized anxiety disorder: Secondary | ICD-10-CM | POA: Diagnosis not present

## 2024-10-21 DIAGNOSIS — E1169 Type 2 diabetes mellitus with other specified complication: Secondary | ICD-10-CM | POA: Diagnosis not present

## 2024-10-21 DIAGNOSIS — E785 Hyperlipidemia, unspecified: Secondary | ICD-10-CM

## 2024-10-21 DIAGNOSIS — M064 Inflammatory polyarthropathy: Secondary | ICD-10-CM

## 2024-10-21 DIAGNOSIS — Z79899 Other long term (current) drug therapy: Secondary | ICD-10-CM

## 2024-10-21 MED ORDER — ESTRADIOL 0.5 MG PO TABS: 0.5000 mg | ORAL_TABLET | Freq: Every day | ORAL | 5 refills | Status: AC

## 2024-10-21 MED ORDER — ROPINIROLE HCL 0.25 MG PO TABS: ORAL_TABLET | ORAL | 3 refills | Status: DC

## 2024-10-21 MED ORDER — DAPAGLIFLOZIN PROPANEDIOL 10 MG PO TABS: ORAL_TABLET | ORAL | 1 refills | Status: AC

## 2024-10-21 MED ORDER — ATORVASTATIN CALCIUM 10 MG PO TABS: 10.0000 mg | ORAL_TABLET | Freq: Every day | ORAL | 5 refills | Status: AC

## 2024-10-21 MED ORDER — FLUCONAZOLE 150 MG PO TABS: 150.0000 mg | ORAL_TABLET | Freq: Every day | ORAL | 0 refills | Status: AC

## 2024-10-21 MED ORDER — ROPINIROLE HCL 0.5 MG PO TABS: 0.5000 mg | ORAL_TABLET | Freq: Every day | ORAL | 4 refills | Status: AC

## 2024-10-21 MED ORDER — LEVOTHYROXINE SODIUM 50 MCG PO TABS: 50.0000 ug | ORAL_TABLET | Freq: Every day | ORAL | 1 refills | Status: AC

## 2024-10-21 MED ORDER — CARIPRAZINE HCL 3 MG PO CAPS: 3.0000 mg | ORAL_CAPSULE | Freq: Every day | ORAL | 5 refills | Status: AC

## 2024-10-21 MED ORDER — ALPRAZOLAM 0.5 MG PO TABS: 0.5000 mg | ORAL_TABLET | Freq: Two times a day (BID) | ORAL | 2 refills | Status: AC | PRN

## 2024-10-21 MED ORDER — LEVOCETIRIZINE DIHYDROCHLORIDE 5 MG PO TABS: 5.0000 mg | ORAL_TABLET | Freq: Every evening | ORAL | 1 refills | Status: AC

## 2024-10-21 MED ORDER — TIRZEPATIDE 10 MG/0.5ML ~~LOC~~ SOAJ: 10.0000 mg | SUBCUTANEOUS | 5 refills | Status: AC

## 2024-10-21 MED ORDER — PANTOPRAZOLE SODIUM 40 MG PO TBEC: 40.0000 mg | DELAYED_RELEASE_TABLET | Freq: Every day | ORAL | 1 refills | Status: AC

## 2024-10-21 NOTE — Progress Notes (Signed)
 [facility] [address] [place], [place] [address]  Internal MEDICINE  Office Visit Note  Patient Name: [name] [name]  [id number]  [id number]  Date of Service: 10/21/2024  Chief Complaint  Patient presents with   Diabetes   Hyperlipidemia   Follow-up    HPI [name] [name] presents for a follow-up visit for diabetes, high cholesterol, refills and lab orders.  Diabetes -- need to repeat A1c. Taking mounjaro  and farxiga .  High cholesterol -- on statin therapy.  Chronic joint pains -- sees pain management Due for multiple refills. Restless legs -- taking ropinirole .     Current Medication: Outpatient Encounter Medications as of 10/21/2024  Medication Sig   estradiol  (ESTRACE ) 0.5 MG tablet Take 1 tablet (0.5 mg total) by mouth daily.   rOPINIRole  (REQUIP ) 0.5 MG tablet Take 1-2 tablets (0.5-1 mg total) by mouth at bedtime.   tirzepatide  (MOUNJARO ) 10 MG/0.5ML Pen Inject 10 mg into the skin once a week.   Accu-Chek Softclix Lancets lancets Use 1 lancet to check glucose level once daily   albuterol  (VENTOLIN  HFA) 108 (90 Base) MCG/ACT inhaler INHALE 1 TO 2 PUFFS INTO THE LUNGS EVERY 6 HOURS AS NEEDED FOR WHEEZING OR SHORTNESS OF BREATH   ALPRAZolam  (XANAX ) 0.5 MG tablet Take 1 tablet (0.5 mg total) by mouth 2 (two) times daily as needed for anxiety or sleep.   ammonium lactate  (LAC-HYDRIN ) 12 % lotion Apply 1 application topically as needed for dry skin.   atorvastatin  (LIPITOR) 10 MG tablet Take 1 tablet (10 mg total) by mouth daily.   Blood Glucose Monitoring Suppl (ACCU-CHEK GUIDE ME) w/Device KIT Use as directed. E11.65   cariprazine  (VRAYLAR ) 3 MG capsule Take 1 capsule (3 mg total) by mouth daily.   Cholecalciferol  25 MCG (1000 UT) tablet Take 1 tablet (1,000 Units total) by mouth once a week.   dapagliflozin  propanediol (FARXIGA ) 10 MG TABS tablet Take 1 tab po daily   diclofenac  Sodium (VOLTAREN ) 1 % GEL SMARTSIG:2-4 Gram(s) Topical 1-4 Times Daily PRN    fluconazole  (DIFLUCAN ) 150 MG tablet Take 1 tablet (150 mg total) by mouth daily for 10 days. Take  1 tab po daily   fluticasone  (FLONASE ) 50 MCG/ACT nasal spray Place 2 sprays into both nostrils daily. (Patient not taking: Reported on 10/21/2024)   glucose blood (ACCU-CHEK GUIDE) test strip Use 1 test strip to check fasting glucose once daily and as need for diabetes   HYDROmorphone  (DILAUDID ) 4 MG tablet Take 4 mg by mouth 4 (four) times daily as needed.   ibuprofen  (ADVIL ) 800 MG tablet TAKE 1 TABLET(800 MG) BY MOUTH EVERY 8 HOURS AS NEEDED FOR MODERATE PAIN   Ipratropium-Albuterol  (COMBIVENT  RESPIMAT) 20-100 MCG/ACT AERS respimat INHALE 1 PUFF INTO THE LUNGS EVERY 6 HOURS AS NEEDED FOR WHEEZING   levocetirizine (XYZAL ) 5 MG tablet Take 1 tablet (5 mg total) by mouth every evening.   levothyroxine  (SYNTHROID ) 50 MCG tablet Take 1 tablet (50 mcg total) by mouth daily before breakfast.   methocarbamol  (ROBAXIN ) 500 MG tablet PLEASE SEE ATTACHED FOR DETAILED DIRECTIONS   morphine  (MS CONTIN ) 60 MG 12 hr tablet Take 60 mg by mouth every 12 (twelve) hours.   Multiple Vitamin (MULTI-VITAMIN) tablet Take 1 tablet by mouth daily.   nystatin  (MYCOSTATIN /NYSTOP ) powder Apply 1 Application topically 3 (three) times daily.   ondansetron  (ZOFRAN -ODT) 4 MG disintegrating tablet Take 1 tablet (4 mg total) by mouth every 8 (eight) hours as needed for nausea or vomiting.   pantoprazole  (PROTONIX ) 40  MG tablet Take 1 tablet (40 mg total) by mouth daily.   pregabalin  (LYRICA ) 150 MG capsule Take 150 mg by mouth 3 (three) times daily.    promethazine  (PHENERGAN ) 25 MG tablet TAKE 1 TABLET(25 MG) BY MOUTH EVERY 8 HOURS AS NEEDED FOR NAUSEA OR VOMITING   traZODone  (DESYREL ) 100 MG tablet TAKE 1 TABLET(100 MG) BY MOUTH AT BEDTIME   Vitamin D , Ergocalciferol , (DRISDOL ) 1.25 MG (50000 UNIT) CAPS capsule Take 1 capsule (50,000 Units total) by mouth every 7 (seven) days.   [DISCONTINUED] ALPRAZolam  (XANAX ) 0.5 MG tablet  Take 1 tablet (0.5 mg total) by mouth 2 (two) times daily as needed for anxiety or sleep.   [DISCONTINUED] atorvastatin  (LIPITOR) 10 MG tablet Take 1 tablet (10 mg total) by mouth daily.   [DISCONTINUED] cariprazine  (VRAYLAR ) 3 MG capsule Take 1 capsule (3 mg total) by mouth daily.   [DISCONTINUED] dapagliflozin  propanediol (FARXIGA ) 10 MG TABS tablet Take 1 tab po daily   [DISCONTINUED] estradiol  (CLIMARA ) 0.0375 mg/24hr patch Place 1 patch (0.0375 mg total) onto the skin once a week. (Patient not taking: Reported on 10/21/2024)   [DISCONTINUED] fluconazole  (DIFLUCAN ) 150 MG tablet Take  1 tab po daily   [DISCONTINUED] Insulin  Pen Needle 31G X 5 MM MISC Use as directed  Once a day Dx e11.65   [DISCONTINUED] levocetirizine (XYZAL ) 5 MG tablet Take 1 tablet (5 mg total) by mouth every evening.   [DISCONTINUED] levothyroxine  (SYNTHROID ) 50 MCG tablet Take 1 tablet (50 mcg total) by mouth daily before breakfast.   [DISCONTINUED] pantoprazole  (PROTONIX ) 40 MG tablet Take 1 tablet (40 mg total) by mouth daily.   [DISCONTINUED] rOPINIRole  (REQUIP ) 0.25 MG tablet Take 1-2 tabs at night for restless legs   [DISCONTINUED] rOPINIRole  (REQUIP ) 0.25 MG tablet Take 1-2 tabs at night for restless legs   [DISCONTINUED] tirzepatide  (MOUNJARO ) 7.5 MG/0.5ML Pen Inject 7.5 mg into the skin once a week.   No facility-administered encounter medications on file as of 10/21/2024.    Surgical History: Past Surgical History:  Procedure Laterality Date   ABDOMINAL HYSTERECTOMY     CARPAL TUNNEL RELEASE     COLONOSCOPY WITH PROPOFOL  N/A 05/09/2023   Procedure: COLONOSCOPY WITH PROPOFOL ;  Surgeon: Therisa Bi, MD;  Location: [facility] ENDOSCOPY;  Service: Gastroenterology;  Laterality: N/A;   COLONOSCOPY WITH PROPOFOL  N/A 10/09/2023   Procedure: COLONOSCOPY WITH PROPOFOL ;  Surgeon: Eartha Angelia Sieving, MD;  Location: [facility] ENDO SUITE;  Service: Gastroenterology;  Laterality: N/A;  1:00PM;ASA 3, bumpt up to 12:45 per Mindy    ESOPHAGOGASTRODUODENOSCOPY (EGD) WITH PROPOFOL  N/A 05/09/2023   Procedure: ESOPHAGOGASTRODUODENOSCOPY (EGD) WITH PROPOFOL ;  Surgeon: Therisa Bi, MD;  Location: [facility] ENDOSCOPY;  Service: Gastroenterology;  Laterality: N/A;   FOOT TENDON SURGERY Right    KNEE SURGERY     SPINAL FUSION     SPINAL FUSION     XI ROBOTIC LAPAROSCOPIC ASSISTED APPENDECTOMY N/A 06/26/2024   Procedure: APPENDECTOMY, ROBOT-ASSISTED, LAPAROSCOPIC;  Surgeon: Tye Millet, DO;  Location: [facility] ORS;  Service: General;  Laterality: N/A;    Medical History: Past Medical History:  Diagnosis Date   Actinic keratitis    Allergy    Anxiety    Asthma    Basal cell carcinoma    Chronic venous insufficiency    DDD (degenerative disc disease), lumbar    Diabetes mellitus without complication (HCC)    Fibromyalgia    Fibromyalgia    Hyperlipidemia    Lymphedema    Melanoma (HCC)    Migraine  Mitral valve prolapse    Sleep apnea    Tendonitis of foot    left    Family History: Family History  Problem Relation Age of Onset   Cancer Paternal Aunt    Cancer Paternal Uncle    Diabetes Paternal Grandfather    Heart disease Paternal Grandfather     Social History   Socioeconomic History   Marital status: Divorced    Spouse name: Not on file   Number of children: Not on file   Years of education: Not on file   Highest education level: Not on file  Occupational History   Not on file  Tobacco Use   Smoking status: Never    Passive exposure: Past   Smokeless tobacco: Never  Vaping Use   Vaping status: Never Used  Substance and Sexual Activity   Alcohol use: No   Drug use: No   Sexual activity: Not on file  Other Topics Concern   Not on file  Social History Narrative   Not on file   Social Drivers of Health   Financial Resource Strain: Low Risk  (07/09/2024)   Received from [facility]   Overall Financial Resource Strain (CARDIA)    Difficulty of Paying Living Expenses: Not hard  at all  Food Insecurity: No Food Insecurity (07/09/2024)   Received from [facility]   Hunger Vital Sign    Within the past 12 months, you worried that your food would run out before you got the money to buy more.: Never true    Within the past 12 months, the food you bought just didn't last and you didn't have money to get more.: Never true  Transportation Needs: No Transportation Needs (07/09/2024)   Received from [facility] - Transportation    In the past 12 months, has lack of transportation kept you from medical appointments or from getting medications?: No    Lack of Transportation (Non-Medical): No  Physical Activity: Not on file  Stress: Not on file  Social Connections: Unknown (04/09/2022)   Received from [facility]   Social Network    Social Network: Not on file  Intimate Partner Violence: Unknown (03/13/2022)   Received from [facility]   HITS    Physically Hurt: Not on file    Insult or Talk Down To: Not on file    Threaten Physical Harm: Not on file    Scream or Curse: Not on file      Review of Systems  Constitutional:  Positive for fatigue. Negative for chills and unexpected weight change.  HENT:  Negative for congestion, postnasal drip, rhinorrhea, sneezing and sore throat.   Eyes:  Negative for redness.  Respiratory: Negative.  Negative for cough, chest tightness, shortness of breath and wheezing.   Cardiovascular: Negative.  Negative for chest pain and palpitations.  Gastrointestinal:  Positive for constipation and nausea. Negative for abdominal pain, diarrhea and vomiting.  Genitourinary:  Negative for dysuria and frequency.  Musculoskeletal:  Positive for arthralgias and back pain. Negative for joint swelling and neck pain.  Skin:  Negative for rash.  Neurological:  Negative for tremors and numbness.  Hematological:  Negative for adenopathy. Does not bruise/bleed easily.  Psychiatric/Behavioral:  Positive for  behavioral problems (Depression), dysphoric mood and sleep disturbance. Negative for self-injury and suicidal ideas. The patient is nervous/anxious.     Vital Signs: BP 120/70   Pulse 87   Temp (!) 97.2 F (36.2  C)   Resp 16   Ht 5' 1 (1.549 m)   Wt 151 lb 9.6 oz (68.8 kg)   LMP 12/13/1991   SpO2 95%   BMI 28.64 kg/m    Physical Exam Vitals reviewed.  Constitutional:      Appearance: Normal appearance. She is obese.  HENT:     Head: Normocephalic and atraumatic.  Eyes:     Pupils: Pupils are equal, round, and reactive to light.  Cardiovascular:     Rate and Rhythm: Normal rate and regular rhythm.  Pulmonary:     Effort: Pulmonary effort is normal. No respiratory distress.  Neurological:     Mental Status: She is alert and oriented to person, place, and time.  Psychiatric:        Mood and Affect: Mood normal.        Behavior: Behavior normal.        Assessment/Plan: 1. Type 2 diabetes mellitus with other specified complication, without long-term current use of insulin  (HCC) (Primary) A1c ordered, continue mounjaro  and farxiga  as prescribed.  - Hgb A1C w/o eAG - tirzepatide  (MOUNJARO ) 10 MG/0.5ML Pen; Inject 10 mg into the skin once a week.  Dispense: 2 mL; Refill: 5 - dapagliflozin  propanediol (FARXIGA ) 10 MG TABS tablet; Take 1 tab po daily  Dispense: 90 tablet; Refill: 1  2. Hyperlipidemia associated with type 2 diabetes mellitus (HCC) Continue atorvastatin  as prescribed.  - atorvastatin  (LIPITOR) 10 MG tablet; Take 1 tablet (10 mg total) by mouth daily.  Dispense: 30 tablet; Refill: 5  3. Acquired hypothyroidism Continue levothyroxine  as prescribed.  - levothyroxine  (SYNTHROID ) 50 MCG tablet; Take 1 tablet (50 mcg total) by mouth daily before breakfast.  Dispense: 90 tablet; Refill: 1  4. Other specified chronic obstructive pulmonary disease (HCC) Continue using her inhalers as prescribed.   5. Inflammatory polyarthropathy (HCC) Continue to follow up with  pain management   6. Nocturnal leg cramps Continue ropinirole  as prescribed. - rOPINIRole  (REQUIP ) 0.5 MG tablet; Take 1-2 tablets (0.5-1 mg total) by mouth at bedtime.  Dispense: 60 tablet; Refill: 4  7. Encounter for medication review Medication list reviewed, updated and refills ordered  - pantoprazole  (PROTONIX ) 40 MG tablet; Take 1 tablet (40 mg total) by mouth daily.  Dispense: 90 tablet; Refill: 1 - levocetirizine (XYZAL ) 5 MG tablet; Take 1 tablet (5 mg total) by mouth every evening.  Dispense: 90 tablet; Refill: 1 - fluconazole  (DIFLUCAN ) 150 MG tablet; Take 1 tablet (150 mg total) by mouth daily for 10 days. Take  1 tab po daily  Dispense: 10 tablet; Refill: 0 - estradiol  (ESTRACE ) 0.5 MG tablet; Take 1 tablet (0.5 mg total) by mouth daily.  Dispense: 30 tablet; Refill: 5 - cariprazine  (VRAYLAR ) 3 MG capsule; Take 1 capsule (3 mg total) by mouth daily.  Dispense: 30 capsule; Refill: 5  8. Severe episode of recurrent major depressive disorder, without psychotic features (HCC) Continue medications as prescribed.    9. GAD (generalized anxiety disorder) Continue prn alprazolam  and daily vraylar  as prescribed.  - ALPRAZolam  (XANAX ) 0.5 MG tablet; Take 1 tablet (0.5 mg total) by mouth 2 (two) times daily as needed for anxiety or sleep.  Dispense: 60 tablet; Refill: 2 - cariprazine  (VRAYLAR ) 3 MG capsule; Take 1 capsule (3 mg total) by mouth daily.  Dispense: 30 capsule; Refill: 5   General Counseling: Carolle verbalizes understanding of the findings of todays visit and agrees with plan of treatment. I have discussed any further diagnostic evaluation that may be  needed or ordered today. We also reviewed her medications today. she has been encouraged to call the office with any questions or concerns that should arise related to todays visit.    Orders Placed This Encounter  Procedures   Hgb A1C w/o eAG    Meds ordered this encounter  Medications   ALPRAZolam  (XANAX ) 0.5 MG  tablet    Sig: Take 1 tablet (0.5 mg total) by mouth 2 (two) times daily as needed for anxiety or sleep.    Dispense:  60 tablet    Refill:  2    Patient is aware to not take alprazolam  with her narcotic pain medications.   tirzepatide  (MOUNJARO ) 10 MG/0.5ML Pen    Sig: Inject 10 mg into the skin once a week.    Dispense:  2 mL    Refill:  5    Note increased dose, fill new script today, discontinue 7.5 mg dose.   DISCONTD: rOPINIRole  (REQUIP ) 0.25 MG tablet    Sig: Take 1-2 tabs at night for restless legs    Dispense:  180 tablet    Refill:  3    Please fill as 90 day supply   pantoprazole  (PROTONIX ) 40 MG tablet    Sig: Take 1 tablet (40 mg total) by mouth daily.    Dispense:  90 tablet    Refill:  1   levothyroxine  (SYNTHROID ) 50 MCG tablet    Sig: Take 1 tablet (50 mcg total) by mouth daily before breakfast.    Dispense:  90 tablet    Refill:  1   levocetirizine (XYZAL ) 5 MG tablet    Sig: Take 1 tablet (5 mg total) by mouth every evening.    Dispense:  90 tablet    Refill:  1   fluconazole  (DIFLUCAN ) 150 MG tablet    Sig: Take 1 tablet (150 mg total) by mouth daily for 10 days. Take  1 tab po daily    Dispense:  10 tablet    Refill:  0    Fill new script today   estradiol  (ESTRACE ) 0.5 MG tablet    Sig: Take 1 tablet (0.5 mg total) by mouth daily.    Dispense:  30 tablet    Refill:  5    Fill new script today, discontinue climara  patch   dapagliflozin  propanediol (FARXIGA ) 10 MG TABS tablet    Sig: Take 1 tab po daily    Dispense:  90 tablet    Refill:  1   cariprazine  (VRAYLAR ) 3 MG capsule    Sig: Take 1 capsule (3 mg total) by mouth daily.    Dispense:  30 capsule    Refill:  5    Vraylar  was recently approved. Note increased dose to 3 mg. Discontinue 1.5 mg dose.   atorvastatin  (LIPITOR) 10 MG tablet    Sig: Take 1 tablet (10 mg total) by mouth daily.    Dispense:  30 tablet    Refill:  5    For future refills, keep on file   rOPINIRole  (REQUIP ) 0.5 MG  tablet    Sig: Take 1-2 tablets (0.5-1 mg total) by mouth at bedtime.    Dispense:  60 tablet    Refill:  4    Fill new script today, discontinue 0.25 mg dose.    Return in about 2 months (around 12/21/2024) for F/U, Abdulahad Mederos PCP.   Total time spent:30 Minutes Time spent includes review of chart, medications, test results, and follow up plan with the patient.  [place] Controlled Substance Database was reviewed by me.  This patient was seen by Mardy Maxin, FNP-C in collaboration with Dr. Sigrid Bathe as a part of collaborative care agreement.   Myracle Febres R. Maxin, MSN, FNP-C Internal medicine

## 2024-12-01 ENCOUNTER — Encounter: Payer: Self-pay | Admitting: Nurse Practitioner

## 2024-12-16 ENCOUNTER — Encounter: Payer: Self-pay | Admitting: Nurse Practitioner

## 2024-12-16 ENCOUNTER — Ambulatory Visit (INDEPENDENT_AMBULATORY_CARE_PROVIDER_SITE_OTHER): Admitting: Nurse Practitioner

## 2024-12-16 VITALS — BP 110/70 | HR 99 | Temp 97.0°F | Resp 16 | Ht 61.0 in | Wt 146.6 lb

## 2024-12-16 DIAGNOSIS — E1169 Type 2 diabetes mellitus with other specified complication: Secondary | ICD-10-CM

## 2024-12-16 DIAGNOSIS — J069 Acute upper respiratory infection, unspecified: Secondary | ICD-10-CM

## 2024-12-16 DIAGNOSIS — R6889 Other general symptoms and signs: Secondary | ICD-10-CM | POA: Diagnosis not present

## 2024-12-16 DIAGNOSIS — M064 Inflammatory polyarthropathy: Secondary | ICD-10-CM

## 2024-12-16 LAB — POCT GLYCOSYLATED HEMOGLOBIN (HGB A1C): Hemoglobin A1C: 6 % — AB (ref 4.0–5.6)

## 2024-12-16 LAB — POCT INFLUENZA A/B
Influenza A, POC: NEGATIVE
Influenza B, POC: NEGATIVE

## 2024-12-16 MED ORDER — IBUPROFEN 800 MG PO TABS: 800.0000 mg | ORAL_TABLET | Freq: Three times a day (TID) | ORAL | 3 refills | Status: AC | PRN

## 2024-12-16 MED ORDER — AZITHROMYCIN 250 MG PO TABS: ORAL_TABLET | ORAL | 0 refills | Status: AC

## 2024-12-16 NOTE — Progress Notes (Signed)
 [facility] [address] [place], [place] [address]  Internal MEDICINE  Office Visit Note  Patient Name: [name] [name]  [id number]  [id number]  Date of Service: 12/16/2024  Chief Complaint  Patient presents with   Diabetes   Hyperlipidemia   Follow-up    Expose to flu a week ago    HPI Adelfa presents for a follow-up visit for URI symptoms, diabetes, weight loss and refills.  Exposure to influenza -- tested in office, negative for flu A and B. URI symptoms x3-4 days -- chills, body aches, nausea, runny nose, nasal congestion, sore throat, sinus pressure, headache, sinus drainage.  Diabetes -- mounjaro  dose increased at last office visit, patient was given 7.5 mg dose when she refilled it last time. A1c significantly improved to 6.0 today from 7.0 in August last year. Currently on mounjaro  which has improved her sugars per her report.  Weight loss -- down 5 more lbs from November. At 146 lbs today. Her goal is 135 lbs.    Current Medication: Outpatient Encounter Medications as of 12/16/2024  Medication Sig   azithromycin  (ZITHROMAX ) 250 MG tablet Take 2 tablets on day 1, then 1 tablet daily on days 2 through 5   Accu-Chek Softclix Lancets lancets Use 1 lancet to check glucose level once daily   albuterol  (VENTOLIN  HFA) 108 (90 Base) MCG/ACT inhaler INHALE 1 TO 2 PUFFS INTO THE LUNGS EVERY 6 HOURS AS NEEDED FOR WHEEZING OR SHORTNESS OF BREATH   ALPRAZolam  (XANAX ) 0.5 MG tablet Take 1 tablet (0.5 mg total) by mouth 2 (two) times daily as needed for anxiety or sleep.   ammonium lactate  (LAC-HYDRIN ) 12 % lotion Apply 1 application topically as needed for dry skin.   atorvastatin  (LIPITOR) 10 MG tablet Take 1 tablet (10 mg total) by mouth daily.   Blood Glucose Monitoring Suppl (ACCU-CHEK GUIDE ME) w/Device KIT Use as directed. E11.65   cariprazine  (VRAYLAR ) 3 MG capsule Take 1 capsule (3 mg total) by mouth daily.   Cholecalciferol  25 MCG (1000 UT) tablet Take 1 tablet  (1,000 Units total) by mouth once a week.   dapagliflozin  propanediol (FARXIGA ) 10 MG TABS tablet Take 1 tab po daily   diclofenac  Sodium (VOLTAREN ) 1 % GEL SMARTSIG:2-4 Gram(s) Topical 1-4 Times Daily PRN   estradiol  (ESTRACE ) 0.5 MG tablet Take 1 tablet (0.5 mg total) by mouth daily.   fluticasone  (FLONASE ) 50 MCG/ACT nasal spray Place 2 sprays into both nostrils daily. (Patient not taking: Reported on 10/21/2024)   glucose blood (ACCU-CHEK GUIDE) test strip Use 1 test strip to check fasting glucose once daily and as need for diabetes   HYDROmorphone  (DILAUDID ) 4 MG tablet Take 4 mg by mouth 4 (four) times daily as needed.   ibuprofen  (ADVIL ) 800 MG tablet Take 1 tablet (800 mg total) by mouth every 8 (eight) hours as needed for moderate pain (pain score 4-6).   Ipratropium-Albuterol  (COMBIVENT  RESPIMAT) 20-100 MCG/ACT AERS respimat INHALE 1 PUFF INTO THE LUNGS EVERY 6 HOURS AS NEEDED FOR WHEEZING   levocetirizine (XYZAL ) 5 MG tablet Take 1 tablet (5 mg total) by mouth every evening.   levothyroxine  (SYNTHROID ) 50 MCG tablet Take 1 tablet (50 mcg total) by mouth daily before breakfast.   methocarbamol  (ROBAXIN ) 500 MG tablet PLEASE SEE ATTACHED FOR DETAILED DIRECTIONS   morphine  (MS CONTIN ) 60 MG 12 hr tablet Take 60 mg by mouth every 12 (twelve) hours.   Multiple Vitamin (MULTI-VITAMIN) tablet Take 1 tablet by mouth daily.   nystatin  (  MYCOSTATIN /NYSTOP ) powder Apply 1 Application topically 3 (three) times daily.   ondansetron  (ZOFRAN -ODT) 4 MG disintegrating tablet Take 1 tablet (4 mg total) by mouth every 8 (eight) hours as needed for nausea or vomiting.   pantoprazole  (PROTONIX ) 40 MG tablet Take 1 tablet (40 mg total) by mouth daily.   pregabalin  (LYRICA ) 150 MG capsule Take 150 mg by mouth 3 (three) times daily.    promethazine  (PHENERGAN ) 25 MG tablet TAKE 1 TABLET(25 MG) BY MOUTH EVERY 8 HOURS AS NEEDED FOR NAUSEA OR VOMITING   rOPINIRole  (REQUIP ) 0.5 MG tablet Take 1-2 tablets (0.5-1 mg  total) by mouth at bedtime.   tirzepatide  (MOUNJARO ) 10 MG/0.5ML Pen Inject 10 mg into the skin once a week.   traZODone  (DESYREL ) 100 MG tablet TAKE 1 TABLET(100 MG) BY MOUTH AT BEDTIME   Vitamin D , Ergocalciferol , (DRISDOL ) 1.25 MG (50000 UNIT) CAPS capsule Take 1 capsule (50,000 Units total) by mouth every 7 (seven) days.   [DISCONTINUED] ibuprofen  (ADVIL ) 800 MG tablet TAKE 1 TABLET(800 MG) BY MOUTH EVERY 8 HOURS AS NEEDED FOR MODERATE PAIN   No facility-administered encounter medications on file as of 12/16/2024.    Surgical History: Past Surgical History:  Procedure Laterality Date   ABDOMINAL HYSTERECTOMY     CARPAL TUNNEL RELEASE     COLONOSCOPY WITH PROPOFOL  N/A 05/09/2023   Procedure: COLONOSCOPY WITH PROPOFOL ;  Surgeon: Therisa Bi, MD;  Location[facility] ENDOSCOPY;  Service: Gastroenterology;  Laterality: N/A;   COLONOSCOPY WITH PROPOFOL  N/A 10/09/2023   Procedure: COLONOSCOPY WITH PROPOFOL ;  Surgeon: Eartha Angelia Sieving, MD;  Location: [facility] ENDO SUITE;  Service: Gastroenterology;  Laterality: N/A;  1:00PM;ASA 3, bumpt up to 12:45 per Mindy   ESOPHAGOGASTRODUODENOSCOPY (EGD) WITH PROPOFOL  N/A 05/09/2023   Procedure: ESOPHAGOGASTRODUODENOSCOPY (EGD) WITH PROPOFOL ;  Surgeon: Therisa Bi, MD;  Location: [facility] ENDOSCOPY;  Service: Gastroenterology;  Laterality: N/A;   FOOT TENDON SURGERY Right    KNEE SURGERY     SPINAL FUSION     SPINAL FUSION     XI ROBOTIC LAPAROSCOPIC ASSISTED APPENDECTOMY N/A 06/26/2024   Procedure: APPENDECTOMY, ROBOT-ASSISTED, LAPAROSCOPIC;  Surgeon: Tye Millet, DO;  Location: [facility] ORS;  Service: General;  Laterality: N/A;    Medical History: Past Medical History:  Diagnosis Date   Actinic keratitis    Allergy    Anxiety    Asthma    Basal cell carcinoma    Chronic venous insufficiency    DDD (degenerative disc disease), lumbar    Diabetes mellitus without complication (HCC)    Fibromyalgia    Fibromyalgia    Hyperlipidemia    Lymphedema     Melanoma (HCC)    Migraine    Mitral valve prolapse    Sleep apnea    Tendonitis of foot    left    Family History: Family History  Problem Relation Age of Onset   Cancer Paternal Aunt    Cancer Paternal Uncle    Diabetes Paternal Grandfather    Heart disease Paternal Grandfather     Social History   Socioeconomic History   Marital status: Divorced    Spouse name: Not on file   Number of children: Not on file   Years of education: Not on file   Highest education level: Not on file  Occupational History   Not on file  Tobacco Use   Smoking status: Never    Passive exposure: Past   Smokeless tobacco: Never  Vaping Use   Vaping status: Never Used  Substance and Sexual  Activity   Alcohol use: No   Drug use: No   Sexual activity: Not on file  Other Topics Concern   Not on file  Social History Narrative   Not on file   Social Drivers of Health   Tobacco Use: Low Risk (12/16/2024)   Patient History    Smoking Tobacco Use: Never    Smokeless Tobacco Use: Never    Passive Exposure: Past  Financial Resource Strain: Low Risk  (07/09/2024)   Received from [facility]   Overall Financial Resource Strain (CARDIA)    Difficulty of Paying Living Expenses: Not hard at all  Food Insecurity: No Food Insecurity (07/09/2024)   Received from [facility]   Epic    Within the past 12 months, you worried that your food would run out before you got the money to buy more.: Never true    Within the past 12 months, the food you bought just didn't last and you didn't have money to get more.: Never true  Transportation Needs: No Transportation Needs (07/09/2024)   Received from [facility] - Transportation    In the past 12 months, has lack of transportation kept you from medical appointments or from getting medications?: No    Lack of Transportation (Non-Medical): No  Physical Activity: Not on file  Stress: Not on file   Social Connections: Not on file  Intimate Partner Violence: Not on file  Depression (PHQ2-9): Low Risk (03/31/2024)   Depression (PHQ2-9)    PHQ-2 Score: 0  Alcohol Screen: Low Risk (09/08/2022)   Alcohol Screen    Last Alcohol Screening Score (AUDIT): 0  Housing: Unknown (07/09/2024)   Received from [facility]   Epic    At any time in the past 12 months, were you homeless or living in a shelter (including now)?: No    In the last 12 months, was there a time when you were not able to pay the mortgage or rent on time?: No    Number of Times Moved in the Last Year: Not on file  Utilities: Not At Risk (07/09/2024)   Received from [facility]   Epic    In the past 12 months has the electric, gas, oil, or water company threatened to shut off services in your home?: No  Health Literacy: Not on file      Review of Systems  Constitutional:  Positive for chills and fatigue. Negative for unexpected weight change.  HENT:  Positive for congestion, postnasal drip, rhinorrhea and sore throat. Negative for sneezing.   Eyes:  Negative for redness.  Respiratory:  Positive for cough. Negative for chest tightness, shortness of breath and wheezing.   Cardiovascular: Negative.  Negative for chest pain and palpitations.  Gastrointestinal:  Positive for constipation and nausea. Negative for abdominal pain, diarrhea and vomiting.  Genitourinary:  Negative for dysuria and frequency.  Musculoskeletal:  Positive for arthralgias, back pain and myalgias. Negative for joint swelling and neck pain.  Skin:  Negative for rash.  Neurological:  Negative for tremors and numbness.  Hematological:  Negative for adenopathy. Does not bruise/bleed easily.  Psychiatric/Behavioral:  Positive for behavioral problems (Depression), dysphoric mood and sleep disturbance. Negative for self-injury and suicidal ideas. The patient is nervous/anxious.     Vital Signs: BP 110/70   Pulse 99    Temp (!) 97 F (36.1 C)   Resp 16   Ht 5' 1 (1.549 m)  Wt 146 lb 9.6 oz (66.5 kg)   LMP 12/13/1991   SpO2 95%   BMI 27.70 kg/m    Physical Exam Vitals reviewed.  Constitutional:      General: She is not in acute distress.    Appearance: Normal appearance. She is ill-appearing.  HENT:     Head: Normocephalic and atraumatic.     Right Ear: Tympanic membrane, ear canal and external ear normal.     Left Ear: Tympanic membrane, ear canal and external ear normal.     Nose: Congestion and rhinorrhea present.     Mouth/Throat:     Mouth: Mucous membranes are moist.     Pharynx: Posterior oropharyngeal erythema present.  Eyes:     Pupils: Pupils are equal, round, and reactive to light.  Cardiovascular:     Rate and Rhythm: Normal rate and regular rhythm.     Heart sounds: Normal heart sounds. No murmur heard. Pulmonary:     Effort: Pulmonary effort is normal. No respiratory distress.     Breath sounds: Normal breath sounds. No wheezing.  Skin:    General: Skin is warm and dry.     Capillary Refill: Capillary refill takes less than 2 seconds.  Neurological:     Mental Status: She is alert and oriented to person, place, and time.  Psychiatric:        Mood and Affect: Mood normal.        Behavior: Behavior normal.        Assessment/Plan: 1. Type 2 diabetes mellitus with other specified complication, without long-term current use of insulin  (HCC) (Primary) A1c is improved to 6.0 today from 7.0 on prior labs. Continue mounjaro  as prescribed.  - POCT HgB A1C  2. Upper respiratory tract infection, unspecified type Zpak prescribed, take until gone  - azithromycin  (ZITHROMAX ) 250 MG tablet; Take 2 tablets on day 1, then 1 tablet daily on days 2 through 5  Dispense: 6 tablet; Refill: 0  3. Inflammatory polyarthropathy (HCC) Ibuprofen  prescribed as needed.  - ibuprofen  (ADVIL ) 800 MG tablet; Take 1 tablet (800 mg total) by mouth every 8 (eight) hours as needed for moderate pain  (pain score 4-6).  Dispense: 60 tablet; Refill: 3  4. Flu-like symptoms Negative for flu - POCT Influenza A/B   General Counseling: Melissia verbalizes understanding of the findings of todays visit and agrees with plan of treatment. I have discussed any further diagnostic evaluation that may be needed or ordered today. We also reviewed her medications today. she has been encouraged to call the office with any questions or concerns that should arise related to todays visit.    Orders Placed This Encounter  Procedures   POCT Influenza A/B   POCT HgB A1C    Meds ordered this encounter  Medications   ibuprofen  (ADVIL ) 800 MG tablet    Sig: Take 1 tablet (800 mg total) by mouth every 8 (eight) hours as needed for moderate pain (pain score 4-6).    Dispense:  60 tablet    Refill:  3    For future refills, due today   azithromycin  (ZITHROMAX ) 250 MG tablet    Sig: Take 2 tablets on day 1, then 1 tablet daily on days 2 through 5    Dispense:  6 tablet    Refill:  0    Fill new script today    Return for previously scheduled, AWV, Avina Eberle PCP in april.   Total time spent:30 Minutes Time spent includes review of chart,  medications, test results, and follow up plan with the patient.   [place] Controlled Substance Database was reviewed by me.  This patient was seen by Mardy Maxin, FNP-C in collaboration with Dr. Sigrid Bathe as a part of collaborative care agreement.   Kazzandra Desaulniers R. Maxin, MSN, FNP-C Internal medicine

## 2024-12-17 ENCOUNTER — Encounter: Payer: Self-pay | Admitting: Nurse Practitioner

## 2025-01-06 ENCOUNTER — Ambulatory Visit: Admitting: Urology

## 2025-04-01 ENCOUNTER — Ambulatory Visit: Admitting: Nurse Practitioner
